# Patient Record
Sex: Female | Born: 1950 | Race: Black or African American | Hispanic: No | Smoking: Never smoker
Health system: Southern US, Community
[De-identification: ages and names within clinical notes are randomized; demographics above are authoritative.]

## PROBLEM LIST (undated history)

## (undated) DIAGNOSIS — G009 Bacterial meningitis, unspecified: Secondary | ICD-10-CM

## (undated) DIAGNOSIS — I10 Essential (primary) hypertension: Secondary | ICD-10-CM

## (undated) DIAGNOSIS — A419 Sepsis, unspecified organism: Secondary | ICD-10-CM

## (undated) DIAGNOSIS — J45909 Unspecified asthma, uncomplicated: Secondary | ICD-10-CM

## (undated) DIAGNOSIS — I5021 Acute systolic (congestive) heart failure: Secondary | ICD-10-CM

## (undated) HISTORY — PX: ABDOMINAL HYSTERECTOMY: SHX81

---

## 2012-07-26 ENCOUNTER — Emergency Department (HOSPITAL_COMMUNITY): Payer: Self-pay

## 2012-07-26 ENCOUNTER — Encounter (HOSPITAL_COMMUNITY): Payer: Self-pay | Admitting: Emergency Medicine

## 2012-07-26 ENCOUNTER — Observation Stay (HOSPITAL_COMMUNITY)
Admission: EM | Admit: 2012-07-26 | Discharge: 2012-07-27 | Disposition: A | Discharge status: Home / Self Care | Payer: MEDICAID | Attending: Internal Medicine | Admitting: Internal Medicine

## 2012-07-26 DIAGNOSIS — J45901 Unspecified asthma with (acute) exacerbation: Principal | ICD-10-CM | POA: Diagnosis present

## 2012-07-26 DIAGNOSIS — Z6841 Body Mass Index (BMI) 40.0 and over, adult: Secondary | ICD-10-CM | POA: Insufficient documentation

## 2012-07-26 DIAGNOSIS — Z79899 Other long term (current) drug therapy: Secondary | ICD-10-CM | POA: Insufficient documentation

## 2012-07-26 DIAGNOSIS — Z23 Encounter for immunization: Secondary | ICD-10-CM | POA: Insufficient documentation

## 2012-07-26 DIAGNOSIS — R609 Edema, unspecified: Secondary | ICD-10-CM | POA: Diagnosis present

## 2012-07-26 DIAGNOSIS — E669 Obesity, unspecified: Secondary | ICD-10-CM | POA: Diagnosis present

## 2012-07-26 DIAGNOSIS — I10 Essential (primary) hypertension: Secondary | ICD-10-CM | POA: Diagnosis present

## 2012-07-26 DIAGNOSIS — J4541 Moderate persistent asthma with (acute) exacerbation: Secondary | ICD-10-CM

## 2012-07-26 HISTORY — DX: Essential (primary) hypertension: I10

## 2012-07-26 HISTORY — DX: Unspecified asthma, uncomplicated: J45.909

## 2012-07-26 LAB — POCT I-STAT, CHEM 8
Calcium, Ion: 1.11 mmol/L — ABNORMAL LOW (ref 1.13–1.30)
Chloride: 105 mEq/L (ref 96–112)
Creatinine, Ser: 0.8 mg/dL (ref 0.50–1.10)
Glucose, Bld: 112 mg/dL — ABNORMAL HIGH (ref 70–99)
HCT: 40 % (ref 36.0–46.0)
Potassium: 3.9 mEq/L (ref 3.5–5.1)

## 2012-07-26 MED ORDER — IPRATROPIUM BROMIDE 0.02 % IN SOLN
0.5000 mg | RESPIRATORY_TRACT | Status: DC
  Administered 2012-07-27 (×2): 0.5 mg via RESPIRATORY_TRACT
  Filled 2012-07-26 (×2): qty 2.5

## 2012-07-26 MED ORDER — MOMETASONE FURO-FORMOTEROL FUM 100-5 MCG/ACT IN AERO
2.0000 | INHALATION_SPRAY | Freq: Two times a day (BID) | RESPIRATORY_TRACT | Status: DC
  Administered 2012-07-27: 2 via RESPIRATORY_TRACT
  Filled 2012-07-26: qty 8.8

## 2012-07-26 MED ORDER — ENOXAPARIN SODIUM 40 MG/0.4ML ~~LOC~~ SOLN
40.0000 mg | SUBCUTANEOUS | Status: DC
  Administered 2012-07-27: 40 mg via SUBCUTANEOUS
  Filled 2012-07-26: qty 0.4

## 2012-07-26 MED ORDER — ALBUTEROL (5 MG/ML) CONTINUOUS INHALATION SOLN
INHALATION_SOLUTION | RESPIRATORY_TRACT | Status: AC
  Filled 2012-07-26: qty 20

## 2012-07-26 MED ORDER — TRAZODONE HCL 50 MG PO TABS: 50.0000 mg | ORAL_TABLET | Freq: Every evening | ORAL | Status: DC | PRN

## 2012-07-26 MED ORDER — GUAIFENESIN ER 600 MG PO TB12
1200.0000 mg | ORAL_TABLET | Freq: Two times a day (BID) | ORAL | Status: DC
Start: 2012-07-27 — End: 2012-07-27
  Administered 2012-07-27 (×2): 1200 mg via ORAL
  Filled 2012-07-26 (×2): qty 2

## 2012-07-26 MED ORDER — ACETAMINOPHEN 650 MG RE SUPP: 650.0000 mg | Freq: Four times a day (QID) | RECTAL | Status: DC | PRN

## 2012-07-26 MED ORDER — ALBUTEROL SULFATE (5 MG/ML) 0.5% IN NEBU
2.5000 mg | INHALATION_SOLUTION | Freq: Once | RESPIRATORY_TRACT | Status: AC
  Administered 2012-07-26: 2.5 mg via RESPIRATORY_TRACT
  Filled 2012-07-26: qty 0.5

## 2012-07-26 MED ORDER — ALBUTEROL SULFATE (5 MG/ML) 0.5% IN NEBU
10.0000 mg | INHALATION_SOLUTION | Freq: Once | RESPIRATORY_TRACT | Status: AC
  Administered 2012-07-26: 10 mg via RESPIRATORY_TRACT

## 2012-07-26 MED ORDER — ALBUTEROL SULFATE (5 MG/ML) 0.5% IN NEBU: 2.5000 mg | INHALATION_SOLUTION | RESPIRATORY_TRACT | Status: DC | PRN

## 2012-07-26 MED ORDER — METHYLPREDNISOLONE SODIUM SUCC 125 MG IJ SOLR
125.0000 mg | Freq: Once | INTRAMUSCULAR | Status: AC
  Administered 2012-07-26: 125 mg via INTRAVENOUS
  Filled 2012-07-26: qty 2

## 2012-07-26 MED ORDER — ONDANSETRON HCL 4 MG PO TABS: 4.0000 mg | ORAL_TABLET | Freq: Four times a day (QID) | ORAL | Status: DC | PRN

## 2012-07-26 MED ORDER — METHYLPREDNISOLONE SODIUM SUCC 125 MG IJ SOLR
125.0000 mg | Freq: Four times a day (QID) | INTRAMUSCULAR | Status: DC
  Administered 2012-07-27 (×3): 125 mg via INTRAVENOUS
  Filled 2012-07-26 (×3): qty 2

## 2012-07-26 MED ORDER — LEVALBUTEROL HCL 0.63 MG/3ML IN NEBU
0.6300 mg | INHALATION_SOLUTION | Freq: Once | RESPIRATORY_TRACT | Status: AC
  Administered 2012-07-26: 0.63 mg via RESPIRATORY_TRACT
  Filled 2012-07-26: qty 3

## 2012-07-26 MED ORDER — ALBUTEROL SULFATE (5 MG/ML) 0.5% IN NEBU
2.5000 mg | INHALATION_SOLUTION | RESPIRATORY_TRACT | Status: DC
  Administered 2012-07-27 (×2): 2.5 mg via RESPIRATORY_TRACT
  Filled 2012-07-26 (×2): qty 0.5

## 2012-07-26 MED ORDER — IPRATROPIUM BROMIDE 0.02 % IN SOLN
0.5000 mg | Freq: Once | RESPIRATORY_TRACT | Status: AC
  Administered 2012-07-26: 0.5 mg via RESPIRATORY_TRACT
  Filled 2012-07-26: qty 2.5

## 2012-07-26 MED ORDER — ACETAMINOPHEN 325 MG PO TABS
650.0000 mg | ORAL_TABLET | Freq: Four times a day (QID) | ORAL | Status: DC | PRN
  Administered 2012-07-27 (×2): 650 mg via ORAL
  Filled 2012-07-26 (×2): qty 2

## 2012-07-26 MED ORDER — ONDANSETRON HCL 4 MG/2ML IJ SOLN: 4.0000 mg | Freq: Four times a day (QID) | INTRAMUSCULAR | Status: DC | PRN

## 2012-07-26 NOTE — ED Notes (Signed)
Pt c/o asthma attack x 2 days. No relief from inhaler or nebulizer.

## 2012-07-26 NOTE — ED Provider Notes (Signed)
History     CSN: [id number]  Arrival date & time 07/26/12  1545   First MD Initiated Contact with Patient 07/26/12 1558      Chief Complaint  Patient presents with  . Asthma     HPI Pt was seen at 1610.   Per pt, c/o gradual onset and worsening of persistent cough, wheezing and SOB for the past 2 days.  Describes her symptoms as "my asthma is acting up."  Has been using home MDI with transient relief. States she is here from out of town and "sleeping at the nursing home" because a family member is ill. Pt states she "might have come in contact with something I'm allergic to."  Denies hx intubations. LD prednisone approx 3 weeks ago for asthma exacerbation. Denies CP/palpitations, no back pain, no abd pain, no N/V/D, no fevers, no rash, no sore throat/dysphagia, no intra-oral edema, no hoarse voice, no stridor.     Past Medical History  Diagnosis Date  . Asthma   . Hypertension     History reviewed. No pertinent past surgical history.    History  Substance Use Topics  . Smoking status: Never Smoker   . Smokeless tobacco: Not on file  . Alcohol Use: No      Review of Systems ROS: Statement: All systems negative except as marked or noted in the HPI; Constitutional: Negative for fever and chills. ; ; Eyes: Negative for eye pain, redness and discharge. ; ; ENMT: Negative for ear pain, hoarseness, nasal congestion, sinus pressure and sore throat. ; ; Cardiovascular: Negative for chest pain, palpitations, diaphoresis, and peripheral edema. ; ; Respiratory: +cough, wheezing, SOB. Negative for stridor. ; ; Gastrointestinal: Negative for nausea, vomiting, diarrhea, abdominal pain, blood in stool, hematemesis, jaundice and rectal bleeding. . ; ; Genitourinary: Negative for dysuria, flank pain and hematuria. ; ; Musculoskeletal: Negative for back pain and neck pain. Negative for swelling and trauma.; ; Skin: Negative for pruritus, rash, abrasions, blisters, bruising and skin lesion.; ;  Neuro: Negative for headache, lightheadedness and neck stiffness. Negative for weakness, altered level of consciousness , altered mental status, extremity weakness, paresthesias, involuntary movement, seizure and syncope.       Allergies  Review of patient's allergies indicates no known allergies.  Home Medications   Current Outpatient Rx  Name  Route  Sig  Dispense  Refill  . albuterol (PROVENTIL HFA;VENTOLIN HFA) 108 (90 BASE) MCG/ACT inhaler   Inhalation   Inhale 2 puffs into the lungs every 6 (six) hours as needed for wheezing.         Marland Kitchen albuterol (PROVENTIL) (5 MG/ML) 0.5% nebulizer solution   Nebulization   Take 5 mg by nebulization every 6 (six) hours as needed for wheezing.           BP 141/83  Pulse 105  Temp(Src) 97.3 F (36.3 C) (Oral)  Resp 18  Ht 5\' 3"  (1.6 m)  Wt 280 lb (127.007 kg)  BMI 49.61 kg/m2  SpO2 91%  Physical Exam 1615: Physical examination:  Nursing notes reviewed; Vital signs and O2 SAT reviewed;  Constitutional: Well developed, Well nourished, Well hydrated, Uncomfortable appearing.; Head:  Normocephalic, atraumatic; Eyes: EOMI, PERRL, No scleral icterus; ENMT: Mouth and pharynx normal, Mucous membranes moist; Neck: Supple, Full range of motion, No lymphadenopathy; Cardiovascular: Tachycardic rate and rhythm, No gallop; Respiratory: Breath sounds diminished & equal bilaterally, insp/exp wheezes bilat with audible wheezing.  Speaking in phrases. Tachypneic, sitting upright on side of stretcher.; Chest: Nontender, Movement  normal; Abdomen: Soft, Nontender, Nondistended, Normal bowel sounds; Genitourinary: No CVA tenderness; Extremities: Pulses normal, No tenderness, No edema, No calf edema or asymmetry.; Neuro: AA&Ox3, Major CN grossly intact.  Speech clear. No gross focal motor or sensory deficits in extremities.; Skin: Color normal, Warm, Dry.   ED Course  Procedures   1620:  Uncomfortable, tachypneic, wheezing: ordered IV solumedrol and  continuous neb.  1830:  Pt has completed continuous neb. Still sitting up on side of stretcher, talking with family, lungs with exp wheezes bilat, occasional faint audible wheezing, Sats 91-93% R/A. Will dose xopenex neb due to tachycardia from hour long albuterol neb. Dx and testing d/w pt and family.  Questions answered.  Verb understanding, agreeable to observation admit.  T/C to [facility]. Kerry Hough, case discussed, including:  HPI, pertinent PM/SHx, VS/PE, dx testing, ED course and treatment:  Agreeable to observation admit, requests to write temporary orders, obtain medical bed to team 2.    MDM  MDM Reviewed: nursing note and vitals Interpretation: labs and x-ray Total time providing critical care: 30-74 minutes. This excludes time spent performing separately reportable procedures and services. Consults: admitting MD   CRITICAL CARE Performed by: Laray Anger Total critical care time: 40 Critical care time was exclusive of separately billable procedures and treating other patients. Critical care was necessary to treat or prevent imminent or life-threatening deterioration. Critical care was time spent personally by me on the following activities: development of treatment plan with patient and/or surrogate as well as nursing, discussions with consultants, evaluation of patient's response to treatment, examination of patient, obtaining history from patient or surrogate, ordering and performing treatments and interventions, ordering and review of laboratory studies, ordering and review of radiographic studies, pulse oximetry and re-evaluation of patient's condition.    Results for orders placed during the hospital encounter of 07/26/12  POCT I-STAT, CHEM 8      Result Value Range   Sodium 141  135 - 145 mEq/L   Potassium 3.9  3.5 - 5.1 mEq/L   Chloride 105  96 - 112 mEq/L   BUN 17  6 - 23 mg/dL   Creatinine, Ser 4.09  0.50 - 1.10 mg/dL   Glucose, Bld 811 (*) 70 - 99 mg/dL    Calcium, Ion 9.14 (*) 1.13 - 1.30 mmol/L   TCO2 30  0 - 100 mmol/L   Hemoglobin 13.6  12.0 - 15.0 g/dL   HCT 78.2  95.6 - 21.3 %   Dg Chest Port 1 View 07/26/2012   *RADIOLOGY REPORT*  Clinical Data: Shortness of breath.  Asthma.  PORTABLE CHEST - 1 VIEW  Comparison: None.  Findings: The heart size is exaggerated by low lung volumes.  No focal airspace disease is evident.  The lungs are clear.  The visualized soft tissues and bony thorax are unremarkable.  IMPRESSION:  1.  Low lung volumes. 2.  No acute cardiopulmonary disease.   Original Report Authenticated By: Marin Roberts, M.D.          Laray Anger, DO 07/29/12 [id number]

## 2012-07-26 NOTE — H&P (Signed)
[facility] History and Physical  Morgan Morgan  WUJ:[id number]  DOB: Mar 19, 1950   DOA: 07/26/2012   PCP:   No primary provider on file.   Chief Complaint:  Shortness of breath for 2 days  HPI: Morgan Morgan is a 62 y.o. female.   Obese middle-aged [place] American lady, lives in [place], has been visiting the area for 4 weeks because of illness of her mother. (31 years old with hip fracture,  In [facility]) she has not been using her antihypertensive medications for the past 4 weeks and developed shortness of breath for the past 2 days not relieved by using her nebulized. In the emergency room she had serial nebulizations with only mild improvement and the hospitalist service was called to admit. Patient also reports increasing swelling of her feet but believes this is due to being on her feet at [facility] for so long, and does not wish to have this attended to here. She just wants to get back home.  She reports a mild nonproductive cough she denies fever or chills She believes her antihypertensive medication is amlodipine 5 mg  Rewiew of Systems:   All systems negative except as marked bold or noted in the HPI;  Constitutional:    malaise, fever and chills. ;  Eyes:   eye pain, redness and discharge. ;  ENMT:   ear pain, hoarseness, nasal congestion, sinus pressure and sore throat. ;  Cardiovascular:    chest pain, palpitations, diaphoresis, dyspnea and peripheral edema.  Respiratory:   cough, hemoptysis, wheezing and stridor. ;  Gastrointestinal:  nausea, vomiting, diarrhea, constipation, abdominal pain, melena, blood in stool, hematemesis, jaundice and rectal bleeding. unusual weight loss..   Genitourinary:    frequency, dysuria, incontinence,flank pain and hematuria; Musculoskeletal:   back pain and neck pain.  swelling and trauma.;  Skin: .  pruritus, rash, abrasions, bruising and skin lesion.; ulcerations Neuro:    headache, lightheadedness and neck stiffness.  weakness,  altered level of consciousness, altered mental status, extremity weakness, burning feet, involuntary movement, seizure and syncope.  Psych:    anxiety, depression, insomnia, tearfulness, panic attacks, hallucinations, paranoia, suicidal or homicidal ideation    Past Medical History  Diagnosis Date  . Asthma   . Hypertension     History reviewed. No pertinent past surgical history.  Medications:  HOME MEDS: Prior to Admission medications   Medication Sig Start Date End Date Taking? Authorizing Provider  albuterol (PROVENTIL HFA;VENTOLIN HFA) 108 (90 BASE) MCG/ACT inhaler Inhale 2 puffs into the lungs every 6 (six) hours as needed for wheezing.   Yes Historical Provider, MD  albuterol (PROVENTIL) (5 MG/ML) 0.5% nebulizer solution Take 5 mg by nebulization every 6 (six) hours as needed for wheezing.   Yes Historical Provider, MD     Allergies:  No Known Allergies  Social History:   reports that she has never smoked. She does not have any smokeless tobacco history on file. She reports that she does not drink alcohol or use illicit drugs.  Family History: No family history on file.   Physical Exam: Filed Vitals:   07/26/12 1850 07/26/12 1900 07/26/12 1931 07/26/12 2108  BP: 141/83   144/81  Pulse: 105   101  Temp:    98.4 F (36.9 C)  TempSrc:    Oral  Resp: 18   20  Height:      Weight:      SpO2: 91% 92% 92% 93%   Blood pressure 144/81, pulse 101, temperature 98.4  F (36.9 C), temperature source Oral, resp. rate 20, height 5\' 3"  (1.6 m), weight 127.007 kg (280 lb), SpO2 93.00%.  GEN:  Pleasant obese African American lady sitting up  bed in no acute distress; cooperative with exam PSYCH:  alert and oriented x4; neither anxious nor depressed; affect is appropriate. HEENT: Mucous membranes pink and anicteric; PERRLA; EOM intact; no cervical lymphadenopathy nor thyromegaly or carotid bruit; no JVD; thick neck Breasts:: Not examined CHEST WALL: No tenderness CHEST:  Tachypnea and diffuse wheezing and rhonchi bilaterally HEART: Regular rate and rhythm; no murmurs rubs or gallops ABDOMEN: Obese, soft non-tender; no masses, no organomegaly, normal abdominal bowel sounds; Rectal Exam: Not done EXTREMITIES: age-appropriate arthropathy of the hands and knees; 2+ edema; no ulcerations. Genitalia: not examined PULSES: 2+ and symmetric SKIN: Normal hydration no rash or ulceration CNS: Cranial nerves 2-12 grossly intact no focal lateralizing neurologic deficit   Labs on Admission:  Basic Metabolic Panel:  Recent Labs Lab 07/26/12 1738  NA 141  K 3.9  CL 105  GLUCOSE 112*  BUN 17  CREATININE 0.80   Liver Function Tests: No results found for this basename: AST, ALT, ALKPHOS, BILITOT, PROT, ALBUMIN,  in the last 168 hours No results found for this basename: LIPASE, AMYLASE,  in the last 168 hours No results found for this basename: AMMONIA,  in the last 168 hours CBC:  Recent Labs Lab 07/26/12 1738  HGB 13.6  HCT 40.0   Cardiac Enzymes: No results found for this basename: CKTOTAL, CKMB, CKMBINDEX, TROPONINI,  in the last 168 hours BNP: No components found with this basename: POCBNP,  D-dimer: No components found with this basename: D-DIMER,  CBG: No results found for this basename: GLUCAP,  in the last 168 hours  Radiological Exams on Admission: Dg Chest Port 1 View  07/26/2012   *RADIOLOGY REPORT*  Clinical Data: Shortness of breath.  Asthma.  PORTABLE CHEST - 1 VIEW  Comparison: None.  Findings: The heart size is exaggerated by low lung volumes.  No focal airspace disease is evident.  The lungs are clear.  The visualized soft tissues and bony thorax are unremarkable.  IMPRESSION:  1.  Low lung volumes. 2.  No acute cardiopulmonary disease.   Original Report Authenticated By: Marin Roberts, M.D.    Assessment/Plan    Active Problems:   Asthma exacerbation   Obesity, unspecified   Edema   Essential hypertension,  benign   PLAN: We'll give this lady treatment for her eczema exacerbation, including nebulizations, intravenous steroids oxygen support,On inhaled steroids. Will not give antibiotics.  She is possibly having CHF exacerbation, and so will check a BNP although this is likely to be low in the setting of morbid obesity.  Discussed the importance of diet and exercise in the promotion of chronic good health; and she reports she is working on this with her doctor  Other plans as per orders.  Code Status: : Full code  Family Communication:  Plans discuss with patient and family at bedside Disposition Plan: Likely home in a day or 2    Morgan Morgan Nocturnist [facility] Pager [phone]   07/26/2012, 9:25 PM

## 2012-07-26 NOTE — ED Notes (Signed)
Attempted to call report

## 2012-07-27 LAB — CBC
MCH: 27.8 pg (ref 26.0–34.0)
MCHC: 32.2 g/dL (ref 30.0–36.0)
Platelets: 201 10*3/uL (ref 150–400)
Platelets: 209 10*3/uL (ref 150–400)
RBC: 4.31 MIL/uL (ref 3.87–5.11)
RDW: 14.4 % (ref 11.5–15.5)
RDW: 14.5 % (ref 11.5–15.5)
WBC: 6.8 10*3/uL (ref 4.0–10.5)

## 2012-07-27 LAB — URINE MICROSCOPIC-ADD ON

## 2012-07-27 LAB — HEMOGLOBIN A1C
Hgb A1c MFr Bld: 5.7 % — ABNORMAL HIGH (ref <5.7)
Mean Plasma Glucose: 117 mg/dL — ABNORMAL HIGH (ref <117)

## 2012-07-27 LAB — URINALYSIS, ROUTINE W REFLEX MICROSCOPIC
Bilirubin Urine: NEGATIVE
Ketones, ur: 15 mg/dL — AB
Nitrite: POSITIVE — AB
Specific Gravity, Urine: >1.03 — ABNORMAL HIGH (ref 1.005–1.030)
Urobilinogen, UA: 0.2 mg/dL (ref 0.0–1.0)

## 2012-07-27 LAB — CREATININE, SERUM
GFR calc Af Amer: >90 mL/min (ref >90)
GFR calc non Af Amer: >90 mL/min (ref >90)

## 2012-07-27 LAB — PRO B NATRIURETIC PEPTIDE: Pro B Natriuretic peptide (BNP): 252.3 pg/mL — ABNORMAL HIGH (ref 0–125)

## 2012-07-27 LAB — COMPREHENSIVE METABOLIC PANEL
AST: 18 U/L (ref 0–37)
Albumin: 3.2 g/dL — ABNORMAL LOW (ref 3.5–5.2)
Calcium: 9.1 mg/dL (ref 8.4–10.5)
Creatinine, Ser: 0.61 mg/dL (ref 0.50–1.10)
GFR calc non Af Amer: >90 mL/min (ref >90)

## 2012-07-27 MED ORDER — LEVOFLOXACIN 500 MG PO TABS: 500.0000 mg | ORAL_TABLET | Freq: Every day | ORAL | Status: AC

## 2012-07-27 MED ORDER — PREDNISONE 20 MG PO TABS: ORAL_TABLET | ORAL | Status: DC

## 2012-07-27 MED ORDER — MOMETASONE FURO-FORMOTEROL FUM 100-5 MCG/ACT IN AERO: 2.0000 | INHALATION_SPRAY | Freq: Two times a day (BID) | RESPIRATORY_TRACT | Status: AC

## 2012-07-27 MED ORDER — IBUPROFEN 400 MG PO TABS
400.0000 mg | ORAL_TABLET | Freq: Once | ORAL | Status: AC
  Administered 2012-07-27: 400 mg via ORAL
  Filled 2012-07-27: qty 1

## 2012-07-27 MED ORDER — PNEUMOCOCCAL VAC POLYVALENT 25 MCG/0.5ML IJ INJ
0.5000 mL | INJECTION | INTRAMUSCULAR | Status: AC
  Administered 2012-07-27: 0.5 mL via INTRAMUSCULAR
  Filled 2012-07-27: qty 0.5

## 2012-07-27 MED FILL — Albuterol Sulfate Soln Nebu 0.5% (5 MG/ML): RESPIRATORY_TRACT | Qty: 2 | Status: AC

## 2012-07-27 NOTE — Progress Notes (Signed)
Pt taken to main entrance in wheelchair by staff member.  

## 2012-07-27 NOTE — Discharge Summary (Signed)
Physician Discharge Summary  Morgan Morgan WUJ:[id number] DOB: [birth year] DOA: 07/26/2012  PCP: No primary provider on file.  Admit date: 07/26/2012 Discharge date: 07/27/2012  Time spent: Greater than 30 minutes  Recommendations for Outpatient Follow-up:  1. Followup with her primary care physician in [place].   Discharge Diagnoses:  1. Asthma excess sedation, improving. 2. Hypertension. 3. Obesity.   Discharge Condition: Improved. Stable.  Diet recommendation: Heart healthy diet.  Filed Weights   07/26/12 1547 07/27/12 0504  Weight: 127.007 kg (280 lb) 131.6 kg (290 lb 2 oz)    History of present illness:  This 62 year old lady, who is visiting this date from [place] to be with her mother who is in a skilled nursing facility, presented with symptoms of dyspnea. Please see initial history as outlined below: Morgan Morgan is a 62 y.o. female. Obese middle-aged [place] American lady, lives in [place], has been visiting the area for 4 weeks because of illness of her mother. (90 years old with hip fracture, In [facility]) she has not been using her antihypertensive medications for the past 4 weeks and developed shortness of breath for the past 2 days not relieved by using her nebulized. In the emergency room she had serial nebulizations with only mild improvement and the hospitalist service was called to admit.  Patient also reports increasing swelling of her feet but believes this is due to being on her feet at [facility] for so long, and does not wish to have this attended to here. She just wants to get back home.  She reports a mild nonproductive cough she denies fever or chills  She believes her antihypertensive medication is amlodipine 5 mg  Hospital Course:  The patient has done well overnight with intravenous steroids and bronchodilators. She feels 60-70% improved and wishes to go home. She does not have increased work of breathing. She has a productive cough of brown sputum.  There is no evidence of pneumonia on chest x-ray.  Procedures:  None.   Consultations:  None.  Discharge Exam: Filed Vitals:   07/27/12 0504 07/27/12 0735 07/27/12 0831 07/27/12 0835  BP: 135/81     Pulse: 92   90  Temp: 97.9 F (36.6 C)     TempSrc: Oral     Resp: 20   20  Height:      Weight: 131.6 kg (290 lb 2 oz)     SpO2: 93% 94% 91% 93%    General: She looks systemically well. There is no increased work of breathing. There is no peripheral or central cyanosis. Cardiovascular: Heart sounds are present without murmurs or added sounds. There is no clinical evidence of heart failure. Respiratory: Lung fields show bilateral wheezing, not tight. There is no bronchial breathing or crackles. She is alert and orientated.  Discharge Instructions  Discharge Orders   Future Orders Complete By Expires     Diet - low sodium heart healthy  As directed     Increase activity slowly  As directed         Medication List    TAKE these medications       albuterol (5 MG/ML) 0.5% nebulizer solution  Commonly known as:  PROVENTIL  Take 5 mg by nebulization every 6 (six) hours as needed for wheezing.     albuterol 108 (90 BASE) MCG/ACT inhaler  Commonly known as:  PROVENTIL HFA;VENTOLIN HFA  Inhale 2 puffs into the lungs every 6 (six) hours as needed for wheezing.     amLODipine 5  MG tablet  Commonly known as:  NORVASC  Take 5 mg by mouth daily.     levofloxacin 500 MG tablet  Commonly known as:  LEVAQUIN  Take 1 tablet (500 mg total) by mouth daily.     mometasone-formoterol 100-5 MCG/ACT Aero  Commonly known as:  DULERA  Inhale 2 puffs into the lungs 2 (two) times daily.     predniSONE 20 MG tablet  Commonly known as:  DELTASONE  Take 2 tablets daily for 3 days, then 1 tablet daily for 3 days, then half tablet daily for 3 days, then STOP.       Allergies  Allergen Reactions  . Zithromax (Azithromycin) Itching      The results of significant diagnostics from  this hospitalization (including imaging, microbiology, ancillary and laboratory) are listed below for reference.    Significant Diagnostic Studies: Dg Chest Port 1 View  07/26/2012   *RADIOLOGY REPORT*  Clinical Data: Shortness of breath.  Asthma.  PORTABLE CHEST - 1 VIEW  Comparison: None.  Findings: The heart size is exaggerated by low lung volumes.  No focal airspace disease is evident.  The lungs are clear.  The visualized soft tissues and bony thorax are unremarkable.  IMPRESSION:  1.  Low lung volumes. 2.  No acute cardiopulmonary disease.   Original Report Authenticated By: Marin Roberts, M.D.        Labs: Basic Metabolic Panel:  Recent Labs Lab 07/26/12 1738 07/27/12 0013 07/27/12 0514  NA 141  --  140  K 3.9  --  3.7  CL 105  --  104  CO2  --   --  28  GLUCOSE 112*  --  135*  BUN 17  --  15  CREATININE 0.80 0.64 0.61  CALCIUM  --   --  9.1   Liver Function Tests:  Recent Labs Lab 07/27/12 0514  AST 18  ALT 12  ALKPHOS 91  BILITOT 0.3  PROT 7.2  ALBUMIN 3.2*     CBC:  Recent Labs Lab 07/26/12 1738 07/27/12 0013 07/27/12 0514  WBC  --  6.8 5.6  HGB 13.6 11.8* 12.0  HCT 40.0 36.3 37.3  MCV  --  86.4 86.5  PLT  --  209 201     BNP: BNP (last 3 results)  Recent Labs  07/27/12 0013  PROBNP 252.3*         Signed:  GOSRANI,NIMISH C  [facility] 07/27/2012, 10:14 AM

## 2012-07-27 NOTE — Progress Notes (Addendum)
IV removed, site WNL.  Pt given d/c instructions and new prescriptions.  Discussed home care with patient and discussed home medications, patient verbalizes understanding, teachback completed. F/U appointment [place] be [place] pt states they will make and keep appointment. Pt is stable at this time and greatly desires to be discharged.

## 2015-02-05 IMAGING — CR DG CHEST 1V PORT
1 series · 1 of 1 positions shown · non-contrast
Comparison: None.

CLINICAL DATA: Shortness of breath.  Asthma.

PORTABLE CHEST - 1 VIEW

[view not recorded]
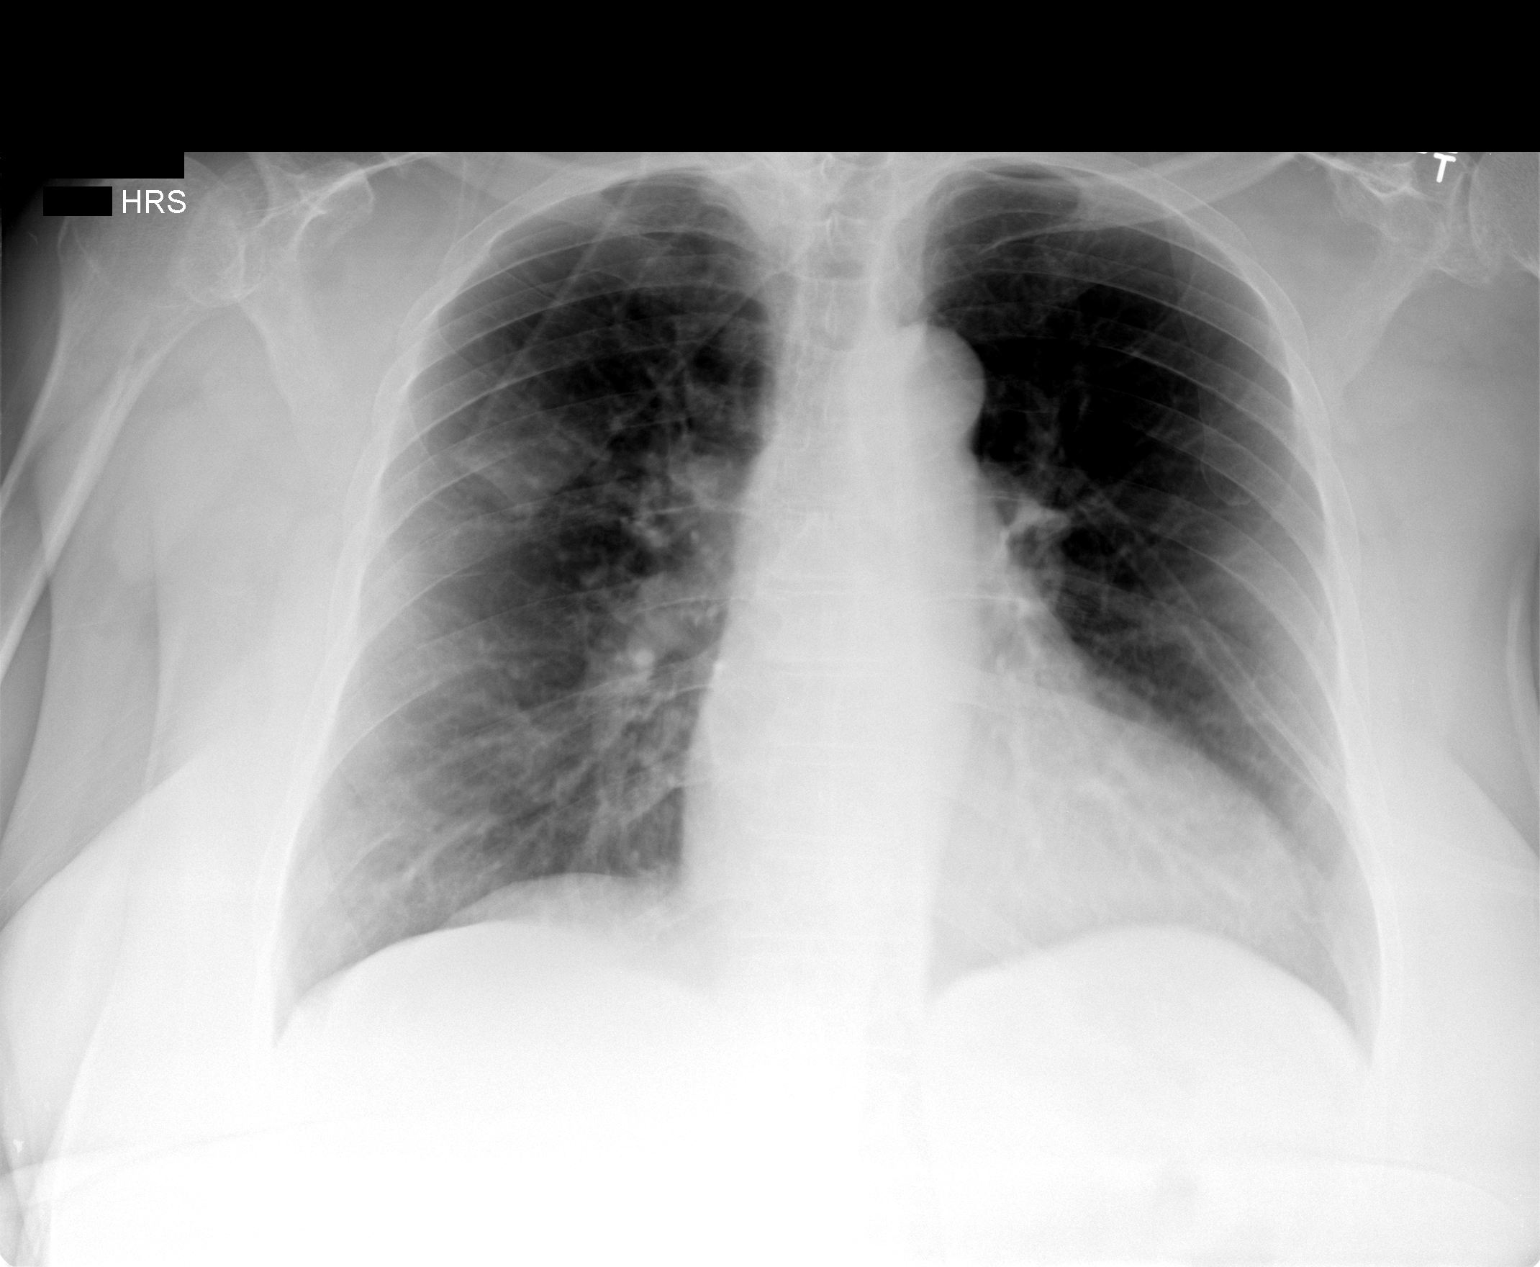

[1 of 1 positions shown; findings below may reference images not displayed]

FINDINGS: The heart size is exaggerated by low lung volumes.  No
focal airspace disease is evident.  The lungs are clear.  The
visualized soft tissues and bony thorax are unremarkable.
IMPRESSION: 1.  Low lung volumes.
2.  No acute cardiopulmonary disease.

## 2021-10-05 ENCOUNTER — Emergency Department (HOSPITAL_COMMUNITY)
Admission: EM | Admit: 2021-10-05 | Discharge: 2021-10-05 | Disposition: A | Discharge status: Home / Self Care | Payer: Medicare Other | Attending: Emergency Medicine | Admitting: Emergency Medicine

## 2021-10-05 ENCOUNTER — Telehealth: Payer: Self-pay | Admitting: Gastroenterology

## 2021-10-05 ENCOUNTER — Emergency Department (HOSPITAL_COMMUNITY): Payer: Medicare Other

## 2021-10-05 ENCOUNTER — Encounter (HOSPITAL_COMMUNITY): Payer: Self-pay

## 2021-10-05 DIAGNOSIS — R112 Nausea with vomiting, unspecified: Secondary | ICD-10-CM

## 2021-10-05 DIAGNOSIS — Z79899 Other long term (current) drug therapy: Secondary | ICD-10-CM | POA: Diagnosis not present

## 2021-10-05 DIAGNOSIS — J45909 Unspecified asthma, uncomplicated: Secondary | ICD-10-CM | POA: Insufficient documentation

## 2021-10-05 DIAGNOSIS — I10 Essential (primary) hypertension: Secondary | ICD-10-CM | POA: Diagnosis not present

## 2021-10-05 DIAGNOSIS — Z7951 Long term (current) use of inhaled steroids: Secondary | ICD-10-CM | POA: Insufficient documentation

## 2021-10-05 DIAGNOSIS — Q446 Cystic disease of liver: Secondary | ICD-10-CM | POA: Diagnosis not present

## 2021-10-05 DIAGNOSIS — N281 Cyst of kidney, acquired: Secondary | ICD-10-CM | POA: Insufficient documentation

## 2021-10-05 LAB — CBC
HCT: 47.8 % — ABNORMAL HIGH (ref 36.0–46.0)
Hemoglobin: 15.5 g/dL — ABNORMAL HIGH (ref 12.0–15.0)
MCH: 27.8 pg (ref 26.0–34.0)
MCHC: 32.4 g/dL (ref 30.0–36.0)
MCV: 85.7 fL (ref 80.0–100.0)
Platelets: 242 10*3/uL (ref 150–400)
RBC: 5.58 MIL/uL — ABNORMAL HIGH (ref 3.87–5.11)
RDW: 16.1 % — ABNORMAL HIGH (ref 11.5–15.5)
WBC: 6.5 10*3/uL (ref 4.0–10.5)
nRBC: 0 % (ref 0.0–0.2)

## 2021-10-05 LAB — URINALYSIS, ROUTINE W REFLEX MICROSCOPIC
Bacteria, UA: NONE SEEN
Bilirubin Urine: NEGATIVE
Glucose, UA: NEGATIVE mg/dL
Ketones, ur: 80 mg/dL — AB
Leukocytes,Ua: NEGATIVE
Nitrite: NEGATIVE
Protein, ur: NEGATIVE mg/dL
Specific Gravity, Urine: >1.046 — ABNORMAL HIGH (ref 1.005–1.030)
pH: 5 (ref 5.0–8.0)

## 2021-10-05 LAB — COMPREHENSIVE METABOLIC PANEL
ALT: 11 U/L (ref 0–44)
AST: 18 U/L (ref 15–41)
Albumin: 3.7 g/dL (ref 3.5–5.0)
Alkaline Phosphatase: 85 U/L (ref 38–126)
Anion gap: 8 (ref 5–15)
BUN: 18 mg/dL (ref 8–23)
CO2: 30 mmol/L (ref 22–32)
Calcium: 9.9 mg/dL (ref 8.9–10.3)
Chloride: 100 mmol/L (ref 98–111)
Creatinine, Ser: 0.84 mg/dL (ref 0.44–1.00)
GFR, Estimated: >60 mL/min (ref >60)
Glucose, Bld: 111 mg/dL — ABNORMAL HIGH (ref 70–99)
Potassium: 4 mmol/L (ref 3.5–5.1)
Sodium: 138 mmol/L (ref 135–145)
Total Bilirubin: 0.6 mg/dL (ref 0.3–1.2)
Total Protein: 8.4 g/dL — ABNORMAL HIGH (ref 6.5–8.1)

## 2021-10-05 LAB — MAGNESIUM: Magnesium: 2 mg/dL (ref 1.7–2.4)

## 2021-10-05 LAB — LIPASE, BLOOD: Lipase: 30 U/L (ref 11–51)

## 2021-10-05 MED ORDER — SODIUM CHLORIDE 0.9 % IV BOLUS
1000.0000 mL | Freq: Once | INTRAVENOUS | Status: AC
Start: 2021-10-05 — End: 2021-10-05
  Administered 2021-10-05: 1000 mL via INTRAVENOUS

## 2021-10-05 MED ORDER — IOHEXOL 300 MG/ML  SOLN
80.0000 mL | Freq: Once | INTRAMUSCULAR | Status: AC | PRN
  Administered 2021-10-05: 100 mL via INTRAVENOUS

## 2021-10-05 MED ORDER — ONDANSETRON HCL 4 MG PO TABS: 4.0000 mg | ORAL_TABLET | Freq: Four times a day (QID) | ORAL | 0 refills | Status: DC

## 2021-10-05 MED ORDER — ONDANSETRON HCL 4 MG/2ML IJ SOLN
4.0000 mg | Freq: Once | INTRAMUSCULAR | Status: AC
Start: 2021-10-05 — End: 2021-10-05
  Administered 2021-10-05: 4 mg via INTRAVENOUS
  Filled 2021-10-05: qty 2

## 2021-10-05 MED ORDER — SODIUM CHLORIDE 0.9 % IV SOLN: INTRAVENOUS | Status: DC

## 2021-10-05 NOTE — ED Notes (Signed)
2 unsuccessful IV attempts at this time. Consulting civil engineer notified.

## 2021-10-05 NOTE — ED Triage Notes (Signed)
Pt states she has had emesis since Thursday. Denies blood. Pt states last BM Friday, small but normal.   Pt states constant nausea.

## 2021-10-05 NOTE — ED Notes (Signed)
Patient transported to CT 

## 2021-10-05 NOTE — ED Notes (Signed)
Patient able to tolerate 8 oz of water without nausea or vomiting.

## 2021-10-05 NOTE — Discharge Instructions (Addendum)
Today you were seen in the emergency department for your nausea and vomiting.    In the emergency department you had a CT scan that showed cysts in your liver and kidneys likely due to polycystic liver disease.    At home, please take the Zofran as needed for any nausea and vomiting that you may have.    Follow-up with your primary doctor in 2-3 days regarding your visit.  Please also follow-up with the GI doctors regarding your liver cysts - they will call you to set up an appointment.   Return immediately to the emergency department if you experience any of the following: Recurrent nausea or vomiting, fevers, severe abdominal pain, or any other concerning symptoms.    Thank you for visiting our Emergency Department. It was a pleasure taking care of you today.

## 2021-10-05 NOTE — Telephone Encounter (Signed)
Darl Pikes:  Patient was seen in the ED. Incidentally found to have polycystic liver disease.  Can you arrange an outpatient follow-up with me? Thanks!

## 2021-10-05 NOTE — ED Provider Notes (Signed)
[facility] EMERGENCY DEPARTMENT Provider Note   CSN: [id number] Arrival date & time: 10/05/21  1109     History Chief Complaint  Patient presents with   Emesis    Morgan Morgan is a 71 y.o. female.  71 year old female with a history of remote hysterectomy, hypertension, asthma who presents emergency department with nausea and vomiting.  Patient reports that on Thursday night she started experiencing nausea and vomiting.  Says that it is yellow and has not been bloody or bilious.  Says that she is having approximately 6 episodes per day.  Says that her last bowel movement was on Friday and has not had one since.  Says this is atypical because she usually has 2 bowel movements per day.  Did pass gas yesterday but does not believe she is passing gas today.  Denies any abdominal surgery history aside from her hysterectomy.  No history of bowel obstructions.  Denies any marijuana use.  Denies any fevers, sick contacts, recent travel, chest pain, shortness of breath, dizziness, or fatigue otherwise.  No abdominal pain associated with nausea and vomiting.  Denies any dysuria or frequency.   Emesis      Home Medications Prior to Admission medications   Medication Sig Start Date End Date Taking? Authorizing Provider  amLODipine (NORVASC) 5 MG tablet Take 5 mg by mouth daily.   Yes [provider]  D3 SUPER STRENGTH 50 MCG (2000 UT) CAPS Take 1 capsule by mouth daily. 08/03/21  Yes [provider]  ibuprofen (ADVIL) 200 MG tablet Take 200 mg by mouth every 6 (six) hours as needed for mild pain, headache or fever.   Yes [provider]  mometasone-formoterol (DULERA) 100-5 MCG/ACT AERO Inhale 2 puffs into the lungs 2 (two) times daily. 07/27/12  Yes Gosrani, Nimish C, MD  ondansetron (ZOFRAN) 4 MG tablet Take 1 tablet (4 mg total) by mouth every 6 (six) hours. 10/05/21  Yes Rondel Baton, MD      Allergies    Zithromax [azithromycin]    Review of Systems    Review of Systems  Gastrointestinal:  Positive for vomiting.    Physical Exam Updated Vital Signs BP (!) 158/91   Pulse 68   Temp 97.9 F (36.6 C) (Oral)   Resp 18   Ht 5\' 3"  (1.6 m)   Wt 103 kg   SpO2 97%   BMI 40.22 kg/m  Physical Exam Vitals and nursing note reviewed.  Constitutional:      General: She is not in acute distress.    Appearance: She is well-developed.     Comments: Uncomfortable appearing holding emesis basin  HENT:     Head: Normocephalic and atraumatic.     Right Ear: External ear normal.     Left Ear: External ear normal.     Nose: Nose normal.  Eyes:     Extraocular Movements: Extraocular movements intact.     Conjunctiva/sclera: Conjunctivae normal.     Pupils: Pupils are equal, round, and reactive to light.  Cardiovascular:     Rate and Rhythm: Normal rate and regular rhythm.     Heart sounds: No murmur heard. Pulmonary:     Effort: Pulmonary effort is normal. No respiratory distress.     Breath sounds: Normal breath sounds.  Abdominal:     Palpations: Abdomen is soft.     Tenderness: There is no right CVA tenderness or left CVA tenderness.     Comments: Initial exam limited due to the fact that  patient was seated in a triage chair.  Prior surgery from what appears to be an open abdominal hysterectomy.  No significant tenderness or guarding.  Musculoskeletal:        General: No swelling.     Cervical back: Normal range of motion and neck supple.  Skin:    General: Skin is warm and dry.     Capillary Refill: Capillary refill takes 2 to 3 seconds.  Neurological:     Mental Status: She is alert and oriented to person, place, and time. Mental status is at baseline.  Psychiatric:        Mood and Affect: Mood normal.     ED Results / Procedures / Treatments   Labs (all labs ordered are listed, but only abnormal results are displayed) Labs Reviewed  COMPREHENSIVE METABOLIC PANEL - Abnormal; Notable for the following components:      Result  Value   Glucose, Bld 111 (*)    Total Protein 8.4 (*)    All other components within normal limits  CBC - Abnormal; Notable for the following components:   RBC 5.58 (*)    Hemoglobin 15.5 (*)    HCT 47.8 (*)    RDW 16.1 (*)    All other components within normal limits  URINALYSIS, ROUTINE W REFLEX MICROSCOPIC - Abnormal; Notable for the following components:   Specific Gravity, Urine >1.046 (*)    Hgb urine dipstick MODERATE (*)    Ketones, ur 80 (*)    All other components within normal limits  LIPASE, BLOOD  MAGNESIUM    EKG None  Radiology CT ABDOMEN PELVIS W CONTRAST  Result Date: 10/05/2021 CLINICAL DATA:  Bowel obstruction suspected. Nausea beginning several days ago. EXAM: CT ABDOMEN AND PELVIS WITH CONTRAST TECHNIQUE: Multidetector CT imaging of the abdomen and pelvis was performed using the standard protocol following bolus administration of intravenous contrast. RADIATION DOSE REDUCTION: This exam was performed according to the departmental dose-optimization program which includes automated exposure control, adjustment of the mA and/or kV according to patient size and/or use of iterative reconstruction technique. CONTRAST:  OMNIPAQUE IOHEXOL 300 MG/ML  SOLN COMPARISON:  None FINDINGS: Lower chest: Cardiomegaly. Lung bases are clear. No pleural or pericardial effusion. Hepatobiliary: Innumerable cysts throughout the liver. The largest is at the caudal tip of the right lobe measuring 7.5 cm in diameter. No calcified gallstones. Pancreas: Normal Spleen: Normal Adrenals/Urinary Tract: Adrenal glands are normal. Multiple simple cysts the kidneys. No mass, stone or hydronephrosis. No specific follow-up recommended. Stomach/Bowel: Stomach and small intestine are normal. Diverticulosis of the left colon without evidence of diverticulitis. Vascular/Lymphatic: Aortic atherosclerosis. No aneurysm. IVC is normal. No adenopathy. Reproductive: Previous hysterectomy.  No pelvic mass. Other:  No free fluid or air. Musculoskeletal: Ordinary lumbar degenerative changes. IMPRESSION: No acute finding to explain the clinical presentation. No evidence of bowel obstruction or acute inflammation. Diverticulosis of the colon without evidence of diverticulitis. Numerous benign cysts of the liver and kidneys, more extensively affecting the liver than the kidneys. No specific follow-up recommended. Aortic Atherosclerosis (ICD10-I70.0). Ordinary spinal degenerative changes. Electronically Signed   By: Paulina Fusi M.D.   On: 10/05/2021 14:06    Procedures Procedures   Medications Ordered in ED Medications  sodium chloride 0.9 % bolus 1,000 mL (0 mLs Intravenous Stopped 10/05/21 1633)    And  0.9 %  sodium chloride infusion (has no administration in time range)  ondansetron (ZOFRAN) injection 4 mg (4 mg Intravenous Given 10/05/21 1251)  iohexol (OMNIPAQUE)  300 MG/ML solution 80 mL (100 mLs Intravenous Contrast Given 10/05/21 1320)    ED Course/ Medical Decision Making/ A&P Clinical Course as of 10/05/21 2018  Mon Oct 05, 2021  1315 Creatinine: 0.84 Baseline of 0.6 [RP]  1328 CT scan reviewed and interpreted by me as showing numerous masses in the liver as well as kidney.  Concerning for malignancy.  [RP]  [id number] CT ABDOMEN PELVIS W CONTRAST IMPRESSION: No acute finding to explain the clinical presentation. No evidence of bowel obstruction or acute inflammation. Diverticulosis of the colon without evidence of diverticulitis.  Numerous benign cysts of the liver and kidneys, more extensively affecting the liver than the kidneys. No specific follow-up recommended.  Aortic Atherosclerosis (ICD10-I70.0).  Ordinary spinal degenerative changes. [RP]  [id number] Spoke to radiology regarding the patient's imaging.  They feel that this could be consistent with polycystic kidney or liver disease. [RP]  [id number] Spoke with PA Cyndi Bender from GI regarding the patient's CT findings.  They will follow-up with the  patient in clinic. [RP]    Clinical Course User Index [RP] Fransico Meadow, MD                           Medical Decision Making Amount and/or Complexity of Data Reviewed Labs: ordered. Decision-making details documented in ED Course. Radiology: ordered. Decision-making details documented in ED Course.  Risk Prescription drug management.   71 year old female with a history of remote hysterectomy, hypertension, asthma who presents emergency department with nausea and vomiting.  Initial DDx: bowel obstruction, gastritis, N/V, ileus   Plan:  Labs Zofran Fluids CT abdomen/pelvis  ED Summary:  Patient underwent the above work-up which did show evidence of bowel obstruction.  Did however show multiple cystic lesions that were concerning for possible polycystic liver disease and kidney disease.  Patient was informed of these findings.  Her nausea and vomiting was controlled with Zofran.  She did not have any severe electrolyte abnormalities and was feeling much better after the Zofran and fluids.  Discussed the patient's case with GI who would like to see her as an outpatient.  Patient was also informed that she should follow-up with her primary doctor in several days.  Return precautions discussed prior to discharge and she was given a prescription of Zofran.  Consults: - GI  Records reviewed Care Everywhere/External Records  I independently visualized the following imaging with scope of interpretation limited to determining acute life threatening conditions related to emergency care: CT, which revealed multiple cystic lesions  Final Clinical Impression(s) / ED Diagnoses Final diagnoses:  Polycystic liver disease  Bilateral renal cysts  Nausea and vomiting, unspecified vomiting type    Rx / DC Orders ED Discharge Orders          Ordered    ondansetron (ZOFRAN) 4 MG tablet  Every 6 hours        10/05/21 1548    Ambulatory referral to Gastroenterology       Comments:  Polycystic liver disease   10/05/21 1549              Fransico Meadow, MD 10/05/21 2018

## 2021-10-22 ENCOUNTER — Encounter: Payer: Self-pay | Admitting: *Deleted

## 2021-10-26 NOTE — Telephone Encounter (Signed)
OV made °

## 2021-11-09 ENCOUNTER — Ambulatory Visit: Payer: Medicare Other | Admitting: Gastroenterology

## 2021-11-19 ENCOUNTER — Ambulatory Visit (INDEPENDENT_AMBULATORY_CARE_PROVIDER_SITE_OTHER): Payer: Medicare Other | Admitting: Gastroenterology

## 2021-11-19 ENCOUNTER — Encounter: Payer: Self-pay | Admitting: Gastroenterology

## 2021-11-19 DIAGNOSIS — Q446 Cystic disease of liver: Secondary | ICD-10-CM | POA: Diagnosis not present

## 2021-11-19 NOTE — Progress Notes (Signed)
Gastroenterology Office Note    Referring Provider: [facility]* Primary Care Physician:  Karl Bales, NP  Primary GI: Dr. Marletta Lor    Chief Complaint   Chief Complaint  Patient presents with   New Patient (Initial Visit)    Pt was bothered by nausea. Pt states that was weeks ago. She is fine now     History of Present Illness   Morgan Morgan is a 71 y.o. female presenting today at the request of Yetta Barre, Kristi S, NP due to history of nausea.   Patient was seen in the ED in late August due to episode of nausea that lasted a week. She had associated vomiting. Regurgitation. This has all resolved now. No chronic GERD.  No dysphagia. No prior colonoscopy. Not interested in colonoscopy. No rectal bleeding. No abdominal pain, changes in bowel habits, constipation, diarrhea, overt GI bleeding,  unexplained weight loss, lack of appetite, unexplained weight gain.   Incidentally found to have innumerable cysts in liver and multiple cysts in kidneys. She states her family member had this as well. She is asymptomatic.     Past Medical History:  Diagnosis Date   Asthma    Hypertension     Past Surgical History:  Procedure Laterality Date   ABDOMINAL HYSTERECTOMY      Current Outpatient Medications  Medication Sig Dispense Refill   albuterol (VENTOLIN HFA) 108 (90 Base) MCG/ACT inhaler Inhale into the lungs every 6 (six) hours as needed for wheezing or shortness of breath.     amLODipine (NORVASC) 5 MG tablet Take 5 mg by mouth daily.     D3 SUPER STRENGTH 50 MCG (2000 UT) CAPS Take 1 capsule by mouth daily.     mometasone-formoterol (DULERA) 100-5 MCG/ACT AERO Inhale 2 puffs into the lungs 2 (two) times daily. 1 Inhaler 0   No current facility-administered medications for this visit.    Allergies as of 11/19/2021 - Review Complete 11/19/2021  Allergen Reaction Noted   Zithromax [azithromycin] Itching 07/26/2012    Family History  Problem Relation Age of  Onset   Asthma Mother    Asthma Brother    Asthma Son    Colon cancer Neg Hx    Colon polyps Neg Hx     Social History   Socioeconomic History   Marital status: Widowed    Spouse name: Not on file   Number of children: Not on file   Years of education: Not on file   Highest education level: Not on file  Occupational History   Not on file  Tobacco Use   Smoking status: Never   Smokeless tobacco: Not on file  Substance and Sexual Activity   Alcohol use: No   Drug use: No   Sexual activity: Yes  Other Topics Concern   Not on file  Social History Narrative   Not on file   Social Determinants of Health   Financial Resource Strain: Not on file  Food Insecurity: Not on file  Transportation Needs: Not on file  Physical Activity: Not on file  Stress: Not on file  Social Connections: Not on file  Intimate Partner Violence: Not on file     Review of Systems   Gen: Denies any fever, chills, fatigue, weight loss, lack of appetite.  CV: Denies chest pain, heart palpitations, peripheral edema, syncope.  Resp: Denies shortness of breath at rest or with exertion. Denies wheezing or cough.  GI: Denies dysphagia or odynophagia. Denies jaundice, hematemesis, fecal  incontinence. GU : Denies urinary burning, urinary frequency, urinary hesitancy MS: Denies joint pain, muscle weakness, cramps, or limitation of movement.  Derm: Denies rash, itching, dry skin Psych: Denies depression, anxiety, memory loss, and confusion Heme: Denies bruising, bleeding, and enlarged lymph nodes.   Physical Exam   BP (!) 176/98 Comment: pt hasn't taken medication today  Pulse 76   Temp (!) 97.5 F (36.4 C)   Ht 5\' 2"  (1.575 m)   Wt 240 lb (108.9 kg)   BMI 43.90 kg/m  General:   Alert and oriented. Pleasant and cooperative. Well-nourished and well-developed.  Head:  Normocephalic and atraumatic. Eyes:  Without icterus Ears:  Normal auditory acuity. Lungs:  Clear to auscultation bilaterally.   Heart:  S1, S2 present without murmurs appreciated.  Abdomen:  +BS, soft, non-tender and non-distended. No HSM noted. No guarding or rebound. No masses appreciated.  Rectal:  Deferred  Msk:  Symmetrical without gross deformities. Normal posture. Extremities:  Without edema. Neurologic:  Alert and  oriented x4;  grossly normal neurologically. Skin:  Intact without significant lesions or rashes. Psych:  Alert and cooperative. Normal mood and affect.   Assessment   Morgan Morgan is a 71 y.o. female presenting today at the request of PCP due to prior episode of nausea.  71 y.o. female presenting today at the request of Ronnald Ramp, Kristi S, NP due to history of nausea.   Seen in the ED late August with week long duration of N/V, which has now completely resolved. She has no current or chronic GI concerns.   Incidentally also found to have polycystic liver disease. She is completely asymptomatic at this time. No further imaging needed.   Notably, she has never had initial screening colonoscopy. No FH colon cancer or polyps. We discussed screening, but she declined today.     PLAN   Return prn  Call if any further episodes of N/V  Colonoscopy when willing   Annitta Needs, PhD, ANP-BC [facility]

## 2021-11-19 NOTE — Patient Instructions (Signed)
Please call us if any further nausea, vomiting, abdominal pain. We recommend a routine screening colonoscopy if you are willing in the future. Please call us if you would like to pursue this!  We will see you back as needed!  It was a pleasure to see you today. I want to create trusting relationships with patients to provide genuine, compassionate, and quality care. I value your feedback. If you receive a survey regarding your visit,  I greatly appreciate you taking time to fill this out.   Annitta Needs, PhD, ANP-BC [facility]

## 2021-11-24 ENCOUNTER — Ambulatory Visit: Payer: Medicare Other | Admitting: Gastroenterology

## 2022-07-22 ENCOUNTER — Other Ambulatory Visit (HOSPITAL_COMMUNITY): Payer: Self-pay | Admitting: Family Medicine

## 2022-07-22 DIAGNOSIS — Z1231 Encounter for screening mammogram for malignant neoplasm of breast: Secondary | ICD-10-CM

## 2022-07-29 ENCOUNTER — Ambulatory Visit (HOSPITAL_COMMUNITY): Payer: Medicare Other

## 2023-05-25 ENCOUNTER — Inpatient Hospital Stay (HOSPITAL_COMMUNITY)
Admission: EM | Admit: 2023-05-25 | Discharge: 2023-06-10 | DRG: 870 | Disposition: A | Discharge status: Skilled Nursing Facility | Attending: Internal Medicine | Admitting: Internal Medicine

## 2023-05-25 ENCOUNTER — Emergency Department (HOSPITAL_COMMUNITY)

## 2023-05-25 ENCOUNTER — Encounter (HOSPITAL_COMMUNITY): Payer: Self-pay | Admitting: Internal Medicine

## 2023-05-25 ENCOUNTER — Other Ambulatory Visit: Payer: Self-pay

## 2023-05-25 DIAGNOSIS — E876 Hypokalemia: Secondary | ICD-10-CM | POA: Diagnosis present

## 2023-05-25 DIAGNOSIS — I5021 Acute systolic (congestive) heart failure: Secondary | ICD-10-CM

## 2023-05-25 DIAGNOSIS — Z6837 Body mass index (BMI) 37.0-37.9, adult: Secondary | ICD-10-CM | POA: Diagnosis not present

## 2023-05-25 DIAGNOSIS — G009 Bacterial meningitis, unspecified: Secondary | ICD-10-CM | POA: Diagnosis present

## 2023-05-25 DIAGNOSIS — N170 Acute kidney failure with tubular necrosis: Secondary | ICD-10-CM | POA: Diagnosis not present

## 2023-05-25 DIAGNOSIS — M4640 Discitis, unspecified, site unspecified: Secondary | ICD-10-CM

## 2023-05-25 DIAGNOSIS — R6521 Severe sepsis with septic shock: Secondary | ICD-10-CM | POA: Diagnosis not present

## 2023-05-25 DIAGNOSIS — J069 Acute upper respiratory infection, unspecified: Secondary | ICD-10-CM | POA: Diagnosis present

## 2023-05-25 DIAGNOSIS — I110 Hypertensive heart disease with heart failure: Secondary | ICD-10-CM | POA: Diagnosis not present

## 2023-05-25 DIAGNOSIS — N179 Acute kidney failure, unspecified: Secondary | ICD-10-CM | POA: Diagnosis not present

## 2023-05-25 DIAGNOSIS — G9340 Encephalopathy, unspecified: Secondary | ICD-10-CM | POA: Diagnosis not present

## 2023-05-25 DIAGNOSIS — B9710 Unspecified enterovirus as the cause of diseases classified elsewhere: Secondary | ICD-10-CM | POA: Diagnosis present

## 2023-05-25 DIAGNOSIS — E538 Deficiency of other specified B group vitamins: Secondary | ICD-10-CM | POA: Diagnosis not present

## 2023-05-25 DIAGNOSIS — G9341 Metabolic encephalopathy: Secondary | ICD-10-CM | POA: Diagnosis present

## 2023-05-25 DIAGNOSIS — J45909 Unspecified asthma, uncomplicated: Secondary | ICD-10-CM | POA: Diagnosis present

## 2023-05-25 DIAGNOSIS — I21A1 Myocardial infarction type 2: Secondary | ICD-10-CM | POA: Diagnosis not present

## 2023-05-25 DIAGNOSIS — D6959 Other secondary thrombocytopenia: Secondary | ICD-10-CM | POA: Diagnosis present

## 2023-05-25 DIAGNOSIS — D72829 Elevated white blood cell count, unspecified: Secondary | ICD-10-CM

## 2023-05-25 DIAGNOSIS — J9601 Acute respiratory failure with hypoxia: Secondary | ICD-10-CM

## 2023-05-25 DIAGNOSIS — M4646 Discitis, unspecified, lumbar region: Secondary | ICD-10-CM | POA: Diagnosis present

## 2023-05-25 DIAGNOSIS — R4182 Altered mental status, unspecified: Secondary | ICD-10-CM | POA: Diagnosis not present

## 2023-05-25 DIAGNOSIS — G039 Meningitis, unspecified: Secondary | ICD-10-CM

## 2023-05-25 DIAGNOSIS — I10 Essential (primary) hypertension: Secondary | ICD-10-CM | POA: Diagnosis not present

## 2023-05-25 DIAGNOSIS — M7989 Other specified soft tissue disorders: Secondary | ICD-10-CM | POA: Diagnosis not present

## 2023-05-25 DIAGNOSIS — M4626 Osteomyelitis of vertebra, lumbar region: Secondary | ICD-10-CM

## 2023-05-25 DIAGNOSIS — Z515 Encounter for palliative care: Secondary | ICD-10-CM | POA: Diagnosis not present

## 2023-05-25 DIAGNOSIS — Z7951 Long term (current) use of inhaled steroids: Secondary | ICD-10-CM

## 2023-05-25 DIAGNOSIS — I951 Orthostatic hypotension: Secondary | ICD-10-CM | POA: Diagnosis not present

## 2023-05-25 DIAGNOSIS — R410 Disorientation, unspecified: Principal | ICD-10-CM

## 2023-05-25 DIAGNOSIS — I4891 Unspecified atrial fibrillation: Secondary | ICD-10-CM | POA: Diagnosis not present

## 2023-05-25 DIAGNOSIS — Q446 Cystic disease of liver: Secondary | ICD-10-CM | POA: Diagnosis not present

## 2023-05-25 DIAGNOSIS — M2569 Stiffness of other specified joint, not elsewhere classified: Secondary | ICD-10-CM | POA: Diagnosis not present

## 2023-05-25 DIAGNOSIS — B348 Other viral infections of unspecified site: Secondary | ICD-10-CM

## 2023-05-25 DIAGNOSIS — A419 Sepsis, unspecified organism: Principal | ICD-10-CM

## 2023-05-25 DIAGNOSIS — E874 Mixed disorder of acid-base balance: Secondary | ICD-10-CM | POA: Diagnosis present

## 2023-05-25 DIAGNOSIS — R4020 Unspecified coma: Secondary | ICD-10-CM | POA: Diagnosis present

## 2023-05-25 DIAGNOSIS — I428 Other cardiomyopathies: Secondary | ICD-10-CM | POA: Diagnosis present

## 2023-05-25 DIAGNOSIS — Z881 Allergy status to other antibiotic agents status: Secondary | ICD-10-CM

## 2023-05-25 DIAGNOSIS — E66812 Obesity, class 2: Secondary | ICD-10-CM | POA: Diagnosis present

## 2023-05-25 DIAGNOSIS — W19XXXA Unspecified fall, initial encounter: Secondary | ICD-10-CM | POA: Diagnosis present

## 2023-05-25 DIAGNOSIS — B9789 Other viral agents as the cause of diseases classified elsewhere: Secondary | ICD-10-CM | POA: Diagnosis present

## 2023-05-25 DIAGNOSIS — R569 Unspecified convulsions: Secondary | ICD-10-CM | POA: Diagnosis not present

## 2023-05-25 DIAGNOSIS — E162 Hypoglycemia, unspecified: Secondary | ICD-10-CM | POA: Diagnosis not present

## 2023-05-25 DIAGNOSIS — Z7189 Other specified counseling: Secondary | ICD-10-CM | POA: Diagnosis not present

## 2023-05-25 DIAGNOSIS — I491 Atrial premature depolarization: Secondary | ICD-10-CM

## 2023-05-25 DIAGNOSIS — Z90710 Acquired absence of both cervix and uterus: Secondary | ICD-10-CM

## 2023-05-25 DIAGNOSIS — I5020 Unspecified systolic (congestive) heart failure: Secondary | ICD-10-CM | POA: Diagnosis not present

## 2023-05-25 DIAGNOSIS — D649 Anemia, unspecified: Secondary | ICD-10-CM | POA: Diagnosis not present

## 2023-05-25 DIAGNOSIS — Z79899 Other long term (current) drug therapy: Secondary | ICD-10-CM

## 2023-05-25 DIAGNOSIS — I472 Ventricular tachycardia, unspecified: Secondary | ICD-10-CM | POA: Diagnosis not present

## 2023-05-25 DIAGNOSIS — Z9911 Dependence on respirator [ventilator] status: Secondary | ICD-10-CM

## 2023-05-25 DIAGNOSIS — R509 Fever, unspecified: Secondary | ICD-10-CM | POA: Diagnosis not present

## 2023-05-25 LAB — BASIC METABOLIC PANEL WITH GFR
Anion gap: 15 (ref 5–15)
BUN: 11 mg/dL (ref 8–23)
CO2: 21 mmol/L — ABNORMAL LOW (ref 22–32)
Calcium: 8.5 mg/dL — ABNORMAL LOW (ref 8.9–10.3)
Chloride: 100 mmol/L (ref 98–111)
Creatinine, Ser: 0.93 mg/dL (ref 0.44–1.00)
GFR, Estimated: >60 mL/min (ref >60)
Glucose, Bld: 128 mg/dL — ABNORMAL HIGH (ref 70–99)
Potassium: 3.4 mmol/L — ABNORMAL LOW (ref 3.5–5.1)
Sodium: 136 mmol/L (ref 135–145)

## 2023-05-25 LAB — DIFFERENTIAL
Abs Immature Granulocytes: 0.2 10*3/uL — ABNORMAL HIGH (ref 0.00–0.07)
Basophils Absolute: 0.1 10*3/uL (ref 0.0–0.1)
Basophils Relative: 0 %
Eosinophils Absolute: 0.1 10*3/uL (ref 0.0–0.5)
Eosinophils Relative: 0 %
Immature Granulocytes: 1 %
Lymphocytes Relative: 2 %
Lymphs Abs: 0.4 10*3/uL — ABNORMAL LOW (ref 0.7–4.0)
Monocytes Absolute: 0.5 10*3/uL (ref 0.1–1.0)
Monocytes Relative: 3 %
Neutro Abs: 14.4 10*3/uL — ABNORMAL HIGH (ref 1.7–7.7)
Neutrophils Relative %: 94 %

## 2023-05-25 LAB — I-STAT VENOUS BLOOD GAS, ED
Acid-Base Excess: 1 mmol/L (ref 0.0–2.0)
Bicarbonate: 25.1 mmol/L (ref 20.0–28.0)
Calcium, Ion: 0.89 mmol/L — CL (ref 1.15–1.40)
HCT: 49 % — ABNORMAL HIGH (ref 36.0–46.0)
Hemoglobin: 16.7 g/dL — ABNORMAL HIGH (ref 12.0–15.0)
O2 Saturation: 73 %
Potassium: 7.3 mmol/L (ref 3.5–5.1)
Sodium: 134 mmol/L — ABNORMAL LOW (ref 135–145)
TCO2: 26 mmol/L (ref 22–32)
pCO2, Ven: 36.2 mmHg — ABNORMAL LOW (ref 44–60)
pH, Ven: 7.449 — ABNORMAL HIGH (ref 7.25–7.43)
pO2, Ven: 37 mmHg (ref 32–45)

## 2023-05-25 LAB — I-STAT CHEM 8, ED
BUN: 13 mg/dL (ref 8–23)
Calcium, Ion: 1.08 mmol/L — ABNORMAL LOW (ref 1.15–1.40)
Chloride: 101 mmol/L (ref 98–111)
Creatinine, Ser: 0.9 mg/dL (ref 0.44–1.00)
Glucose, Bld: 109 mg/dL — ABNORMAL HIGH (ref 70–99)
HCT: 46 % (ref 36.0–46.0)
Hemoglobin: 15.6 g/dL — ABNORMAL HIGH (ref 12.0–15.0)
Potassium: 3.3 mmol/L — ABNORMAL LOW (ref 3.5–5.1)
Sodium: 141 mmol/L (ref 135–145)
TCO2: 26 mmol/L (ref 22–32)

## 2023-05-25 LAB — URINALYSIS, ROUTINE W REFLEX MICROSCOPIC
Bilirubin Urine: NEGATIVE
Glucose, UA: NEGATIVE mg/dL
Ketones, ur: NEGATIVE mg/dL
Leukocytes,Ua: NEGATIVE
Nitrite: NEGATIVE
Protein, ur: NEGATIVE mg/dL
Specific Gravity, Urine: 1.027 (ref 1.005–1.030)
pH: 8 (ref 5.0–8.0)

## 2023-05-25 LAB — RESPIRATORY PANEL BY PCR

## 2023-05-25 LAB — COMPREHENSIVE METABOLIC PANEL WITH GFR
ALT: 13 U/L (ref 0–44)
AST: 39 U/L (ref 15–41)
Albumin: 2.9 g/dL — ABNORMAL LOW (ref 3.5–5.0)
Alkaline Phosphatase: 70 U/L (ref 38–126)
Anion gap: 13 (ref 5–15)
BUN: 11 mg/dL (ref 8–23)
CO2: 25 mmol/L (ref 22–32)
Calcium: 9 mg/dL (ref 8.9–10.3)
Chloride: 102 mmol/L (ref 98–111)
Creatinine, Ser: 1.02 mg/dL — ABNORMAL HIGH (ref 0.44–1.00)
GFR, Estimated: 58 mL/min — ABNORMAL LOW (ref >60)
Glucose, Bld: 97 mg/dL (ref 70–99)
Potassium: 3.1 mmol/L — ABNORMAL LOW (ref 3.5–5.1)
Sodium: 140 mmol/L (ref 135–145)
Total Bilirubin: 1.2 mg/dL (ref 0.0–1.2)
Total Protein: 7.2 g/dL (ref 6.5–8.1)

## 2023-05-25 LAB — TSH: TSH: 0.954 u[IU]/mL (ref 0.350–4.500)

## 2023-05-25 LAB — RESP PANEL BY RT-PCR (RSV, FLU A&B, COVID)  RVPGX2
Influenza A by PCR: NEGATIVE
Influenza B by PCR: NEGATIVE
Resp Syncytial Virus by PCR: NEGATIVE
SARS Coronavirus 2 by RT PCR: NEGATIVE

## 2023-05-25 LAB — RAPID URINE DRUG SCREEN, HOSP PERFORMED
Amphetamines: NOT DETECTED
Barbiturates: NOT DETECTED
Benzodiazepines: NOT DETECTED
Cocaine: NOT DETECTED
Opiates: NOT DETECTED
Tetrahydrocannabinol: NOT DETECTED

## 2023-05-25 LAB — CBG MONITORING, ED: Glucose-Capillary: 101 mg/dL — ABNORMAL HIGH (ref 70–99)

## 2023-05-25 LAB — CBC
HCT: 45 % (ref 36.0–46.0)
Hemoglobin: 14.8 g/dL (ref 12.0–15.0)
MCH: 28.4 pg (ref 26.0–34.0)
MCHC: 32.9 g/dL (ref 30.0–36.0)
MCV: 86.2 fL (ref 80.0–100.0)
Platelets: 143 10*3/uL — ABNORMAL LOW (ref 150–400)
RBC: 5.22 MIL/uL — ABNORMAL HIGH (ref 3.87–5.11)
RDW: 16.3 % — ABNORMAL HIGH (ref 11.5–15.5)
WBC: 15.6 10*3/uL — ABNORMAL HIGH (ref 4.0–10.5)
nRBC: 0.1 % (ref 0.0–0.2)

## 2023-05-25 LAB — PROTIME-INR
INR: 1.1 (ref 0.8–1.2)
Prothrombin Time: 14.5 s (ref 11.4–15.2)

## 2023-05-25 LAB — POTASSIUM: Potassium: 3 mmol/L — ABNORMAL LOW (ref 3.5–5.1)

## 2023-05-25 LAB — CK
Total CK: 672 U/L — ABNORMAL HIGH (ref 38–234)
Total CK: 718 U/L — ABNORMAL HIGH (ref 38–234)

## 2023-05-25 LAB — APTT: aPTT: 28 s (ref 24–36)

## 2023-05-25 LAB — LACTIC ACID, PLASMA
Lactic Acid, Venous: 3.3 mmol/L (ref 0.5–1.9)
Lactic Acid, Venous: 4.2 mmol/L (ref 0.5–1.9)

## 2023-05-25 LAB — ETHANOL: Alcohol, Ethyl (B): <10 mg/dL (ref <10)

## 2023-05-25 MED ORDER — CALCIUM GLUCONATE-NACL 1-0.675 GM/50ML-% IV SOLN
1.0000 g | Freq: Once | INTRAVENOUS | Status: AC
Start: 2023-05-25 — End: 2023-05-26
  Administered 2023-05-25: 1000 mg via INTRAVENOUS
  Filled 2023-05-25: qty 50

## 2023-05-25 MED ORDER — LACTATED RINGERS IV BOLUS
1000.0000 mL | Freq: Once | INTRAVENOUS | Status: AC
  Administered 2023-05-25: 1000 mL via INTRAVENOUS

## 2023-05-25 MED ORDER — CALCIUM GLUCONATE-NACL 1-0.675 GM/50ML-% IV SOLN
1.0000 g | Freq: Once | INTRAVENOUS | Status: AC
  Administered 2023-05-25: 1000 mg via INTRAVENOUS
  Filled 2023-05-25: qty 50

## 2023-05-25 MED ORDER — DEXTROSE 5 % IV SOLN
1000.0000 mg | Freq: Two times a day (BID) | INTRAVENOUS | Status: DC
  Filled 2023-05-25 (×2): qty 20

## 2023-05-25 MED ORDER — DEXAMETHASONE SODIUM PHOSPHATE 10 MG/ML IJ SOLN: 10.0000 mg | Freq: Four times a day (QID) | INTRAMUSCULAR | Status: DC

## 2023-05-25 MED ORDER — LEVETIRACETAM IN NACL 1000 MG/100ML IV SOLN
1000.0000 mg | INTRAVENOUS | Status: AC
  Administered 2023-05-26 (×2): 1000 mg via INTRAVENOUS
  Filled 2023-05-25: qty 100

## 2023-05-25 MED ORDER — DEXTROSE 5 % IV SOLN
700.0000 mg | Freq: Two times a day (BID) | INTRAVENOUS | Status: DC
  Administered 2023-05-25 – 2023-05-28 (×6): 700 mg via INTRAVENOUS
  Filled 2023-05-25 (×8): qty 14

## 2023-05-25 MED ORDER — VANCOMYCIN HCL IN DEXTROSE 1-5 GM/200ML-% IV SOLN: 1000.0000 mg | Freq: Once | INTRAVENOUS | Status: DC

## 2023-05-25 MED ORDER — SODIUM CHLORIDE 0.9 % IV SOLN
2.0000 g | Freq: Once | INTRAVENOUS | Status: AC
  Administered 2023-05-25: 2 g via INTRAVENOUS
  Filled 2023-05-25: qty 20

## 2023-05-25 MED ORDER — LACTATED RINGERS IV SOLN: INTRAVENOUS | Status: DC

## 2023-05-25 MED ORDER — SODIUM CHLORIDE 0.9 % IV SOLN
2.0000 g | Freq: Three times a day (TID) | INTRAVENOUS | Status: DC
  Administered 2023-05-26 – 2023-05-27 (×4): 2 g via INTRAVENOUS
  Filled 2023-05-25 (×6): qty 2000

## 2023-05-25 MED ORDER — IOHEXOL 350 MG/ML SOLN
100.0000 mL | Freq: Once | INTRAVENOUS | Status: AC | PRN
  Administered 2023-05-25: 100 mL via INTRAVENOUS

## 2023-05-25 MED ORDER — CEFTRIAXONE SODIUM 2 G IJ SOLR: 2.0000 g | Freq: Once | INTRAMUSCULAR | Status: DC

## 2023-05-25 MED ORDER — VANCOMYCIN HCL 2000 MG/400ML IV SOLN
2000.0000 mg | Freq: Once | INTRAVENOUS | Status: AC
Start: 2023-05-25 — End: 2023-05-25
  Administered 2023-05-25: 2000 mg via INTRAVENOUS
  Filled 2023-05-25: qty 400

## 2023-05-25 MED ORDER — VANCOMYCIN HCL 750 MG/150ML IV SOLN
750.0000 mg | Freq: Two times a day (BID) | INTRAVENOUS | Status: DC
  Administered 2023-05-26 – 2023-05-27 (×3): 750 mg via INTRAVENOUS
  Filled 2023-05-25 (×5): qty 150

## 2023-05-25 MED ORDER — AMPICILLIN SODIUM 2 G IJ SOLR
2.0000 g | Freq: Once | INTRAMUSCULAR | Status: AC
  Administered 2023-05-25: 2 g via INTRAVENOUS
  Filled 2023-05-25: qty 2000

## 2023-05-25 MED ORDER — VANCOMYCIN HCL 2000 MG/400ML IV SOLN: 2000.0000 mg | Freq: Once | INTRAVENOUS | Status: DC

## 2023-05-25 NOTE — ED Triage Notes (Signed)
 Pt found unresponsive, child on site told ems they were becoming more confused. Code stroke called.

## 2023-05-25 NOTE — ED Notes (Signed)
 Provider notified of NIHSS score increase, primarily due to inability to follow commands.

## 2023-05-25 NOTE — Hospital Course (Addendum)
 Storke like symptoms, encephelopathy. Last known normal yesterday, moreso encepholpathy. Not following commands. Stroke is unremarkable. On antibiotics. Concern for meningitis.   States it's February, alert and orientated to person, place. Headache has been going on.Has been able to eat a little. Sandwich.

## 2023-05-25 NOTE — ED Notes (Signed)
 CCMD called to add pt to cardiac monitoring.

## 2023-05-25 NOTE — ED Notes (Signed)
 Admitting made aware of VBG results.

## 2023-05-25 NOTE — Consult Note (Signed)
 NEUROLOGY CONSULT NOTE   Date of service: May 25, 2023 Patient Name: Morgan Morgan MRN:  [id number] DOB:  April 27, 1950 Chief Complaint: "CODE STROKE" Requesting Provider: Gerhard Munch, MD  History of Present Illness  Morgan Morgan is a 73 y.o. female with hx of HTN and asthma whi was brought in as an EMS-activated CODE STROKE after being found down this morning by her grandson. Per EMS, he knocked on several neighbors' doors before one answered and then they call 911. On EMS arrival, she was unresponsive with clenched BUE. En route, patient was resisting EMS personnel straightening her arms and against them putting in an IV.   On exam at brdige, patient obtunded, turns her head to voice with a dysconjugate right gaze, but does cross midline, resistance strength BUE, withdraws BLE. Grimaces to pain throughout, minimal verbal output with moaning and guttural sounds.    LKW: last night, unknown exact time.  Modified rankin score: 0-Completely asymptomatic and back to baseline post- stroke Per EMS, lives alone with grandson. No cane or walker seen.  IV Thrombolysis: No, outside of window EVT: No, no LVO   NIHSS components Score: Comment  1a Level of Conscious 0[]  1[x]  2[]  3[]      1b LOC Questions 0[]  1[]  2[x]       1c LOC Commands 0[]  1[]  2[x]       2 Best Gaze 0[]  1[x]  2[]       3 Visual 0[x]  1[]  2[]  3[]      4 Facial Palsy 0[x]  1[]  2[]  3[]      5a Motor Arm - left 0[x]  1[]  2[]  3[]  4[]  UN[]    5b Motor Arm - Right 0[x]  1[]  2[]  3[]  4[]  UN[]    6a Motor Leg - Left 0[]  1[]  2[]  3[x]  4[]  UN[]    6b Motor Leg - Right 0[]  1[]  2[]  3[x]  4[]  UN[]    7 Limb Ataxia 0[]  1[]  2[]  3[]  UN[x]     8 Sensory 0[]  1[]  2[]  UN[x]      9 Best Language 0[]  1[]  2[x]  3[]      10 Dysarthria 0[]  1[]  2[x]  UN[]      11 Extinct. and Inattention 0[x]  1[]  2[]       TOTAL:   10      ROS   Unable to ascertain due to AMS  Past History   Past Medical History:  Diagnosis Date   Asthma    Hypertension     Past Surgical  History:  Procedure Laterality Date   ABDOMINAL HYSTERECTOMY      Family History: Family History  Problem Relation Age of Onset   Asthma Mother    Asthma Brother    Asthma Son    Colon cancer Neg Hx    Colon polyps Neg Hx     Social History  reports that she has never smoked. She does not have any smokeless tobacco history on file. She reports that she does not drink alcohol and does not use drugs.  Allergies  Allergen Reactions   Zithromax [Azithromycin] Itching    Medications  No current facility-administered medications for this encounter.  Current Outpatient Medications:    albuterol (VENTOLIN HFA) 108 (90 Base) MCG/ACT inhaler, Inhale into the lungs every 6 (six) hours as needed for wheezing or shortness of breath., Disp: , Rfl:    amLODipine (NORVASC) 5 MG tablet, Take 5 mg by mouth daily., Disp: , Rfl:    D3 SUPER STRENGTH 50 MCG (2000 UT) CAPS, Take 1 capsule by mouth daily., Disp: , Rfl:    mometasone-formoterol (DULERA) 100-5  MCG/ACT AERO, Inhale 2 puffs into the lungs 2 (two) times daily., Disp: 1 Inhaler, Rfl: 0  Vitals   Vitals:   2023/05/29 1000  Weight: 99.4 kg    Body mass index is 40.08 kg/m.  Physical Exam   Constitutional: Appears elderly and acutely ill. Cardiovascular: Normal rate and regular rhythm.  Respiratory: Effort normal, non-labored breathing. Supplemental oxygen needed.  GI: Soft.  No distension. There is no tenderness.  Skin: No open wounds or rashes seen on exam while clothed  Neurologic Examination   Patient is obtunded. She does open eyes to voice/her name.  Dysconjugate gaze with right gaze preference, increased in right eye. She does cross midline.  Facial movement symmetric. Hearing intact to voice.  Does not cooperate with shoulder shrug or tongue protrusion exam.  BUE: Clenched with resistance to passive straightening or lifting.  BLE: withdraws to pain  Grimaces to pain throughout.    Labs/Imaging/Neurodiagnostic  studies   CBC:  Recent Labs  Lab 05/29/2023 1055  HGB 15.6*  HCT 46.0   Basic Metabolic Panel:  Lab Results  Component Value Date   NA 141 05/29/2023   K 3.3 (L) 05/29/23   CO2 30 10/05/2021   GLUCOSE 109 (H) May 29, 2023   BUN 13 29-May-2023   CREATININE 0.90 05-29-23   CALCIUM 9.9 10/05/2021   GFRNONAA >60 10/05/2021   GFRAA >90 07/27/2012   Lipid Panel: No results found for: "LDLCALC" HgbA1c:  Lab Results  Component Value Date   HGBA1C 5.7 (H) 07/27/2012   CT Head without contrast(Personally reviewed): No CT evidence of acute intracranial abnormality. ASPECTS is 10  CT angio Head and Neck with contrast with Perfusion(Personally reviewed): No LVO NO core infarct on perfusion No high-grade stenosis in neck Mild stenosis bilateral cavernous ICAs  MRI Brain(Personally reviewed): PENDING  Neurodiagnostics rEEG:  PENDING  ASSESSMENT   Morgan Morgan is a 73 y.o. female with hx of HTN and asthma whi was brought in as an EMS-activated CODE STROKE after being found down this morning by her grandson. Per EMS, he knocked on several neighbors' doors before one answered and then they call 911. On EMS arrival, she was unresponsive with clenched BUE. En route, patient was resisting EMS personnel straightening her arms and against them putting in an IV.   On exam at brdige, patient obtunded, turns her head to voice with a dysconjugate right gaze, but does cross midline, resistance strength BUE, withdraws BLE. Grimaces to pain throughout, minimal verbal output with moaning and guttural sounds. No seizure-like activity or rhythmic movements seen.   Recommend MRI to evalaute for stroke and EEG to evaluate for seizure. Evaluate for infectious process to explain her obtunded state/possible etiology for seizure if seen.   Impression: Altered mental status-etiology under investigation  RECOMMENDATIONS   - routine EEG - MRI Brain  routine  ______________________________________________________________________    Pt seen by Neuro NP/APP and later by MD. Note/plan to be edited by MD as needed.    Lynnae January, DNP, AGACNP-BC [facility] Please use AMION for contact information & EPIC for messaging.  Attending Neurohospitalist Addendum Patient seen and examined with APP/Resident. Agree with the history and physical as documented above. Agree with the plan as documented, which I helped formulate. I have independently reviewed the chart, obtained history, review of systems and examined the patient.I have personally reviewed pertinent head/neck/spine imaging (CT/MRI).  Patient brought in as a code stroke upon being found on by child/ Child.  Unclear last known well-likely sometime  yesterday.  On examination, grimaces/withdraws to noxious stimulation in both lower extremities and has good localization in the upper extremities.  Has mildly disconjugate gaze.  Noncontrast head CT and CT angio head and neck with contrast and CT perfusion study of the head personally reviewed-unremarkable for acute process I suspect some sort of underlying toxic metabolic encephalopathy. Urinary toxicology screen is negative. Urinalysis is negative for evidence of acute infection I would recommend getting routine EEG and an MRI of the brain to further evaluate.  Also obtain a chest x-ray  She has leukocytosis and if there is no evidence of a UTI or pneumonia, might need to consider CNS infection.  I would cover her with empiric meningitis coverage antibiotics-vancomycin, ceftriaxone and ampicillin as we wait for the testing.  She is not febrile, she has no neck stiffness or meningitic signs but if her exam does not improve, may consider LP.  Discussed with the EDP Dr. Jeraldine Loots.  Please feel free to call with any questions.  -- Milon Dikes, MD Neurologist [facility] Pager: [phone]

## 2023-05-25 NOTE — Progress Notes (Addendum)
 Pharmacy Antibiotic Note  Morgan Morgan is a 73 y.o. female admitted on 05/25/2023 with acute encephalopathy and concern for meningitis.  Pharmacy has been consulted for acyclovir dosing. Patient received vancomycin, ceftriaxone, and ampicillin in the ED on 05/25/23.   4/9: WBC 15.6, Scr 1.02, Lactate 3.3, Temperature 101.49F  Plan: Vancomycin 750 mg IV every 12 hours Ampicillin per MD Ceftriaxone per MD Acyclovir 700 mg IV every 12 hours LR 125 mL/hr while on acyclovir Monitor renal function, cultures, and clinical signs of improvement Monitor vancomycin levels and de-escalate antibiotics as appropriate  Weight: 99.4 kg (219 lb 2.2 oz)  Temp (24hrs), Avg:100.5 F (38.1 C), Min:99.4 F (37.4 C), Max:101.5 F (38.6 C)  Recent Labs  Lab 05/25/23 1055 05/25/23 1224 05/25/23 1544 05/25/23 1642  WBC  --  15.6*  --   --   CREATININE 0.90  --  1.02*  --   LATICACIDVEN  --   --   --  3.3*    CrCl cannot be calculated (Unknown ideal weight.).    Allergies  Allergen Reactions   Zithromax [Azithromycin] Itching    Antimicrobials this admission: Ampicillin 4/9 x1 Ceftriaxone 4/9 x1 Vancomycin 4/9 x1 Acyclovir 4/9 >>  Microbiology results: 4/9 RVP:  Thank you for allowing pharmacy to be a part of this patient's care.  Enos Fling, PharmD PGY-1 Acute Care Pharmacy Resident 05/25/2023 5:43 PM

## 2023-05-25 NOTE — Consult Note (Signed)
 NAME:  Morgan Morgan, MRN:  [id number], DOB:  07/27/1950, LOS: 0 ADMISSION DATE:  05/25/2023, CONSULTATION DATE:  05/25/2023  REFERRING MD:  Rockne Coons, CHIEF COMPLAINT: Altered mental status  History of Present Illness:  Patient is unresponsive, history obtained via chart review. 73 year old woman BIBEMS as a code stroke after being found down by her grandson.  Initial neuroexam she was obtunded with disconjugate gaze, grimaces to pain, withdrawing both lower extremities and localizing in uppers.  Head CT and CT angio was negative as well as UDS Initial labs significant for mild leukocytosis and thrombocytopenia with lactate of 3.3 and CK of 672 with hypokalemia.  Respiratory viral panel was positive for rhinovirus. She subsequently developed fever She was empirically treated with ceftriaxone, vancomycin and ampicillin with acyclovir Repeat labs showed lactate rising to 4.2, PCCM consulted due to concern for ability to maintain airway EKG showed atrial fibrillation MRI brain was negative  Pertinent  Medical History  Asthma Hypertension  Significant Hospital Events: Including procedures, antibiotic start and stop dates in addition to other pertinent events     Interim History / Subjective:  Unresponsive Defervesced  Objective   Blood pressure (!) 149/93, pulse (!) 40, temperature 98 F (36.7 C), resp. rate (!) 32, height 5\' 1"  (1.549 m), weight 99.4 kg, SpO2 93%.        Intake/Output Summary (Last 24 hours) at 05/25/2023 2302 Last data filed at 05/25/2023 2213 Gross per 24 hour  Intake 269.79 ml  Output --  Net 269.79 ml   Filed Weights   05/25/23 1000  Weight: 99.4 kg    Examination: General: Well-built, well-nourished woman, lying in ED stretcher, no distress HENT: No pallor, icterus, no JVD, no neck stiffness, disconjugate gaze Lungs: Bilateral clear breath sounds, no rhonchi Cardiovascular: S1-S2 regular, no murmur Abdomen: Left, nontender mobile  splenomegaly Extremities: No edema Neuro: Unresponsive, GCS 8, pupils 3 mm reactive to light, plantars bilateral downgoing, withdraws to painful stimuli  Chest x-ray shows mild pulm vascular congestion  Resolved Hospital Problem list     Assessment & Plan:  Acute encephalopathy - Differential includes postictal versus meningitis -she is on empiric meningitis coverage including acyclovir and awaiting LP which is planned for a.m. doubt rhinovirus as the cause - Await EEG - She is currently able to maintain airway but would be at high risk for intubation should her mental status worsen.  Atrial fibrillation, new onset - Holding of anticoagulation due to planned LP and low CHA2DS2-VASc score - Will need echo. -Beta-blockade for rate control as needed  Mild CK elevation -could be related to seizure or due to being found down  Best Practice (right click and "Reselect all SmartList Selections" daily)   Per primary team Code Status:  full code Last date of multidisciplinary goals of care discussion [NA]  Labs   CBC: Recent Labs  Lab 05/25/23 1055 05/25/23 1224 05/25/23 2028  WBC  --  15.6*  --   NEUTROABS  --  14.4*  --   HGB 15.6* 14.8 16.7*  HCT 46.0 45.0 49.0*  MCV  --  86.2  --   PLT  --  143*  --     Basic Metabolic Panel: Recent Labs  Lab 05/25/23 1055 05/25/23 1544 05/25/23 1642 05/25/23 2028  NA 141 140  --  134*  K 3.3* 3.1* 3.0* 7.3*  CL 101 102  --   --   CO2  --  25  --   --   GLUCOSE 109* 97  --   --  BUN 13 11  --   --   CREATININE 0.90 1.02*  --   --   CALCIUM  --  9.0  --   --    GFR: Estimated Creatinine Clearance: 53 mL/min (A) (by C-G formula based on SCr of 1.02 mg/dL (H)). Recent Labs  Lab 05/25/23 1224 05/25/23 1642 05/25/23 2021  WBC 15.6*  --   --   LATICACIDVEN  --  3.3* 4.2*    Liver Function Tests: Recent Labs  Lab 05/25/23 1544  AST 39  ALT 13  ALKPHOS 70  BILITOT 1.2  PROT 7.2  ALBUMIN 2.9*   No results for  input(s): "LIPASE", "AMYLASE" in the last 168 hours. No results for input(s): "AMMONIA" in the last 168 hours.  ABG    Component Value Date/Time   HCO3 25.1 05/25/2023 2028   TCO2 26 05/25/2023 2028   O2SAT 73 05/25/2023 2028     Coagulation Profile: Recent Labs  Lab 05/25/23 1642  INR 1.1    Cardiac Enzymes: Recent Labs  Lab 05/25/23 1642  CKTOTAL 672*    HbA1C: Hgb A1c MFr Bld  Date/Time Value Ref Range Status  07/27/2012 12:13 AM 5.7 (H) <5.7 % Final    Comment:    (NOTE)                                                                       According to the ADA Clinical Practice Recommendations for 2011, when HbA1c is used as a screening test:  >=6.5%   Diagnostic of Diabetes Mellitus           (if abnormal result is confirmed) 5.7-6.4%   Increased risk of developing Diabetes Mellitus References:Diagnosis and Classification of Diabetes Mellitus,Diabetes Care,2011,34(Suppl 1):S62-S69 and Standards of Medical Care in         Diabetes - 2011,Diabetes Care,2011,34 (Suppl 1):S11-S61.    CBG: Recent Labs  Lab 05/25/23 1051  GLUCAP 101*    Review of Systems:   Unable to obtain  Past Medical History:  She,  has a past medical history of Asthma and Hypertension.   Surgical History:   Past Surgical History:  Procedure Laterality Date   ABDOMINAL HYSTERECTOMY       Social History:   reports that she has never smoked. She does not have any smokeless tobacco history on file. She reports that she does not drink alcohol and does not use drugs.   Family History:  Her family history includes Asthma in her brother, mother, and son. There is no history of Colon cancer or Colon polyps.   Allergies Allergies  Allergen Reactions   Zithromax [Azithromycin] Itching     Home Medications  Prior to Admission medications   Medication Sig Start Date End Date Taking? Authorizing Provider  mometasone-formoterol (DULERA) 100-5 MCG/ACT AERO Inhale 2 puffs into the lungs  2 (two) times daily. 07/27/12  Yes Gosrani, Nimish C, MD  albuterol (VENTOLIN HFA) 108 (90 Base) MCG/ACT inhaler Inhale into the lungs every 6 (six) hours as needed for wheezing or shortness of breath.    [provider]  amLODipine (NORVASC) 5 MG tablet Take 5 mg by mouth daily.    [provider]  D3 SUPER STRENGTH 50 MCG (2000 UT)  CAPS Take 1 capsule by mouth daily. 08/03/21   [provider]        Cyril Mourning MD. FCCP. [facility] Pager : [phone]  If no response to pager , please call [phone] until 7 pm After 7:00 pm call Elink  [phone]   05/25/2023

## 2023-05-25 NOTE — H&P (Cosign Needed Addendum)
 Date: 05/25/2023               Patient Name:  Morgan Morgan MRN: [id number]  DOB: 08/30/1950 Age / Sex: 73 y.o., female   PCP: Karl Bales, NP         Medical Service: Internal Medicine Teaching Service         Attending Physician: Dr. Dickie La, MD      First Contact: Mahlon Gammon, [facility] MS4     Second Contact: Laretta Bolster, MD         After Hours (After 5p/  First Contact Pager: [phone]  weekends / holidays): Second Contact Pager: [phone]   SUBJECTIVE   Chief Complaint: altered mental status  History of Present Illness: Mrs. Morgan Morgan is a 73 yo with PMHx of asthma and HTN who presents with AMS after falling this morning. Patient is unable to give [place] history due to AMS. She was not responding to our attempts to arouse her. We did obtain history from her sister-in-law, who said the patient's grandson (55 yo) lives with pt. According to grandson, pt was normal yesterday, grandson said she was "panicking" this morning and then fell without any movements of her arms and legs (last known well is not clearly defined). After she fell she was not responsive and grandson called for help. Grandson did not notice patient complaining of headache or other symptoms. He thinks he had a fever recently.  Spoke to her sisters and brother at bedside who could not provide more information on patient's health history besides asthma and "several hospitalizations" when pt lived in [place]. 4 years ago. Family is unsure if patient is on any blood thinners.  ED Course: Due to acute AMS code stroke was activated. On admission ED nurse was able to arouse patient with sternal rub but could not elicit any verbal response. Non-contrast CT, CT angio head and neck, and CT spine were all unremarkable. Patient had leukocytosis but was afebrile with tachypnea, tachycardia, and elevated BP. EKG revealed atrial fibrillation. UA with no signs of infection, UDS pan-negative, and ethanol normal. She was started on  empiric meningitis treatment with amp, vanc, and ceftriaxone.  Meds:  No outpatient medications have been marked as taking for the 05/25/23 encounter [facility] Encounter).  Per outpatient note in 2023: Albuterol Amlodipine Cholecalciferol Mometasone -formoterol  Past Medical History  Past Surgical History:  Procedure Laterality Date   ABDOMINAL HYSTERECTOMY     Per chart review: Asthma HTN Asymptomatic liver and kidney cysts  Social:  Lives With: 36 yo grandson Occupation: retired Support: family Level of Function: Completes all ADLs on her own PCP: Karl Bales, NP Substances: according to family no alcohol or drug use   Family History: family member with liver and kidney cysts.  Allergies: Allergies as of 05/25/2023 - Reviewed 05/25/2023  Allergen Reaction Noted   Zithromax [azithromycin] Itching 07/26/2012    Review of Systems: A complete ROS was negative except as per HPI.   OBJECTIVE:   Physical Exam: Blood pressure (!) 126/99, pulse 50, temperature 101.5 F, temperature source Axillary, resp. rate (!) 38, weight 99.4 kg, SpO2 94%.  Constitutional: lying in bed, non responsive to touch or painful stimuli, expiratory grunting HENT: normocephalic atraumatic, mucous membranes moist, pinpoint pupils,  PERRLA Eyes: conjunctiva non-erythematous Cardiovascular: tachycardia w/ irregular pulse, unable to hear heart sounds d/t grunting  Pulmonary/Chest: normal work of breathing on room air, lungs clear to auscultation bilaterally Abdominal: soft, did not respond to palpation, non-distended  MSK: normal bulk and tone Neurological: not alert or oriented, grunts in response to sternal rub, not able to move UE or LE on command. Skin: cold LE, dry, no rashes, trace edema bilaterally, small area of erythema w/ bullae on left shin possibly from her fall  Labs: CBC    Component Value Date/Time   WBC 15.6 (H) 05/25/2023 1224   RBC 5.22 (H) 05/25/2023 1224   HGB 14.8  05/25/2023 1224   HCT 45.0 05/25/2023 1224   PLT 143 (L) 05/25/2023 1224   MCV 86.2 05/25/2023 1224   MCH 28.4 05/25/2023 1224   MCHC 32.9 05/25/2023 1224   RDW 16.3 (H) 05/25/2023 1224   LYMPHSABS 0.4 (L) 05/25/2023 1224   MONOABS 0.5 05/25/2023 1224   EOSABS 0.1 05/25/2023 1224   BASOSABS 0.1 05/25/2023 1224     CMP     Component Value Date/Time   NA 140 05/25/2023 1544   K 3.1 (L) 05/25/2023 1544   CL 102 05/25/2023 1544   CO2 25 05/25/2023 1544   GLUCOSE 97 05/25/2023 1544   BUN 11 05/25/2023 1544   CREATININE 1.02 (H) 05/25/2023 1544   CALCIUM 9.0 05/25/2023 1544   PROT 7.2 05/25/2023 1544   ALBUMIN 2.9 (L) 05/25/2023 1544   AST 39 05/25/2023 1544   ALT 13 05/25/2023 1544   ALKPHOS 70 05/25/2023 1544   BILITOT 1.2 05/25/2023 1544   GFRNONAA 58 (L) 05/25/2023 1544   GFRAA >90 07/27/2012 0514   [place]- 3.3 CK- 672  UA: small Hgb, otherwise normal  Imaging:   EKG: Atrial fibrillation,likely w substantial motion artifact  Non-con CT head: No CT evidence of acute intracranial abnormality.   CT cervical spine: No acute fracture or traumatic malalignment of the cervical spine.  CT angio head and neck:  No large vessel occlusion. 2.   No core infarct identified on CT perfusion.  3.   No high-grade stenosis of the carotid and vertebral arteries in the neck.  4.  Atherosclerosis resulting in mild stenosis of the bilateral cavernous ICAs. Otherwise no stenosis of the intracranial  arterial vasculature.  5.  Prominence of the bilateral optic nerves. Consider correlation with fundoscopic exam. 6.  Aortic Atherosclerosis    MRI brain wo contrast:  1. Intermittently motion degraded examination. 2. No evidence of an acute intracranial abnormality. The diffusion-weighted imaging is of good quality and there is no evidence of an acute infarct. 3. Mild cerebral white matter chronic small vessel ischemic disease. 4. Paranasal sinus mucosal thickening as  described.  Chest x-ray: Mild cardiomegaly with possible mild central pulmonary vascular congestion.  ASSESSMENT & PLAN:   Assessment & Plan by Problem: Principal Problem:   Altered mental status   Letishia Elliott is a 73 y.o. person living with a history of asthma, hypertension, and asymptomatic liver and kidney cysts who presented with altered mental status admitted for encephalopathy workup on hospital day 0.  Acute encephalopathy Patient presented with AMS after a reported fall this morning but we are unable to get clearly defined last known well. UDS, ethanol level, and BMP are within normal limits ruling out metabolic causes of AMS. Structural causes are unlikely given all brain imaging has been unremarkable. We will get EEG to assess for nonconvulsive status epilepticus as possible etiology. She has a leukocytosis, elevated lactic acid, and has become febrile over past hour making an infectious etiology likely. She had a normal UA and no signs of infection on chest x-ray. Encephalitis is possible given her rapid  decline but no signs of encephalitis seen on CT/MRI. Broad spectrum empiric antibiotics were started at 11 am for meningitis. Neurology saw patient shortly after arrival and recommended empirically treating for meningitis while getting further brain imaging and EEG. Patient's neuro exam has worsened since she saw neurology at 11 am, she is now not responding to painful stimuli and not opening her eyes. Neurology agrees with adding acyclovir to empiric meningitis coverage.  -continue Amp, vanc, and ceftriaxone -start Acyclovir - LP - EEG - follow up TSH and free T4 -follow up respiratory panel results   Atrial Fibrillation We were unable to get a recent medical history from her family, but patient was found to have Afib on EKG which was not present in 2023 per chart review. Her chest-xray did have signs of cardiomegaly and possible pulmonary vascular congestion. She does not have  signs of volume overload on exam although her lungs were difficult to listen to given loud grunting. -get repeat EKG and monitor with telemetry  -continue to monitor volume status -follow up TSH and free T4  Diet: NPO VTE: SCDs IVF: None Code: Full code  Prior to Admission Living Arrangement: home w/grandson Anticipated Discharge Location: pending medical stabilization Barriers to Discharge: pending medical stabilization  Dispo: Admit patient to Inpatient with expected length of stay greater than 2 midnights.  Signed: Mahlon Gammon, Medical Student  05/25/2023, 6:29 PM

## 2023-05-25 NOTE — ED Provider Notes (Signed)
  EMERGENCY DEPARTMENT AT [facility] Provider Note   CSN: [id number] Arrival date & time: 05/25/23  1047  An emergency department physician performed an initial assessment on this suspected stroke patient at 0920.  History  No chief complaint on file.   Morgan Morgan is a 73 y.o. female.  HPI Patient presents as a code stroke.  Level 5 caveat secondary to confusion.  EMS reports the patient has unknown last seen normal, was found minimally responsive on the floor of her house.  Chart review, EMS history notable for history of hypertension, obesity, no known use of antithrombotic.    Home Medications Prior to Admission medications   Medication Sig Start Date End Date Taking? Authorizing Provider  albuterol (VENTOLIN HFA) 108 (90 Base) MCG/ACT inhaler Inhale into the lungs every 6 (six) hours as needed for wheezing or shortness of breath.    [provider]  amLODipine (NORVASC) 5 MG tablet Take 5 mg by mouth daily.    [provider]  D3 SUPER STRENGTH 50 MCG (2000 UT) CAPS Take 1 capsule by mouth daily. 08/03/21   [provider]  mometasone-formoterol (DULERA) 100-5 MCG/ACT AERO Inhale 2 puffs into the lungs 2 (two) times daily. 07/27/12   Wilson Singer, MD      Allergies    Zithromax [azithromycin]    Review of Systems   Review of Systems  Physical Exam Updated Vital Signs BP (!) 171/95   Pulse 81   Temp 99.4 F (37.4 C) (Axillary)   Resp (!) 29   Wt 99.4 kg   SpO2 94%   BMI 40.08 kg/m  Physical Exam Vitals and nursing note reviewed.  Constitutional:      Appearance: She is well-developed. She is obese. She is ill-appearing and diaphoretic.  HENT:     Head: Normocephalic and atraumatic.  Eyes:     Conjunctiva/sclera: Conjunctivae normal.  Cardiovascular:     Rate and Rhythm: Normal rate and regular rhythm.  Pulmonary:     Effort: Pulmonary effort is normal. No respiratory distress.     Breath sounds: Normal  breath sounds. No stridor.  Abdominal:     General: There is no distension.  Skin:    General: Skin is warm.  Neurological:     Cranial Nerves: No cranial nerve deficit.     Comments: Moves all extremities spontaneously, vigorously, but not to command.  Psychiatric:        Cognition and Memory: Cognition is impaired.     ED Results / Procedures / Treatments   Labs (all labs ordered are listed, but only abnormal results are displayed) Labs Reviewed  CBC - Abnormal; Notable for the following components:      Result Value   WBC 15.6 (*)    RBC 5.22 (*)    RDW 16.3 (*)    Platelets 143 (*)    All other components within normal limits  DIFFERENTIAL - Abnormal; Notable for the following components:   Neutro Abs 14.4 (*)    Lymphs Abs 0.4 (*)    Abs Immature Granulocytes 0.20 (*)    All other components within normal limits  URINALYSIS, ROUTINE W REFLEX MICROSCOPIC - Abnormal; Notable for the following components:   APPearance CLOUDY (*)    Hgb urine dipstick SMALL (*)    Bacteria, UA RARE (*)    All other components within normal limits  I-STAT CHEM 8, ED - Abnormal; Notable for the following components:   Potassium 3.3 (*)  Glucose, Bld 109 (*)    Calcium, Ion 1.08 (*)    Hemoglobin 15.6 (*)    All other components within normal limits  CBG MONITORING, ED - Abnormal; Notable for the following components:   Glucose-Capillary 101 (*)    All other components within normal limits  RESP PANEL BY RT-PCR (RSV, FLU A&B, COVID)  RVPGX2  ETHANOL  RAPID URINE DRUG SCREEN, HOSP PERFORMED  COMPREHENSIVE METABOLIC PANEL WITH GFR  PROTIME-INR  APTT    EKG EKG Interpretation Date/Time:  Wednesday May 25 2023 11:53:51 EDT Ventricular Rate:  116 PR Interval:    QRS Duration:  135 QT Interval:  350 QTC Calculation: 487 R Axis:   88  Text Interpretation: Atrial fibrillation  ,likely w substantial arefact - motion Confirmed by Gerhard Munch [phone]) on 05/25/2023 12:07:20  PM  Radiology CT C-SPINE NO CHARGE Result Date: 05/25/2023 CLINICAL DATA:  Syncope EXAM: CT CERVICAL SPINE WITHOUT CONTRAST TECHNIQUE: Multidetector CT imaging of the cervical spine was performed without intravenous contrast. Multiplanar CT image reconstructions were also generated. RADIATION DOSE REDUCTION: This exam was performed according to the departmental dose-optimization program which includes automated exposure control, adjustment of the mA and/or kV according to patient size and/or use of iterative reconstruction technique. COMPARISON:  Same day CT head. FINDINGS: Alignment: Alignment is maintained. No listhesis. No facet subluxation or dislocation. Skull base and vertebrae: No compression fracture or displaced fracture in the cervical spine. No suspicious osseous lesion. Soft tissues and spinal canal: No prevertebral fluid or swelling. No visible canal hematoma. Disc levels: Disc space narrowing at C5-6 and C6-7. Disc osteophyte complexes at C5-6 and C6-7 resulting in mild spinal canal stenosis. Facet arthrosis at multiple levels. Foraminal narrowing is most pronounced on the left at C5-6 and C6-7. Upper chest: Negative. Other: None. IMPRESSION: No acute fracture or traumatic malalignment of the cervical spine. Electronically Signed   By: Emily Filbert M.D.   On: 05/25/2023 14:16   CT ANGIO HEAD NECK W WO CM W PERF (CODE STROKE) Result Date: 05/25/2023 CLINICAL DATA:  Neuro deficit, concern for stroke, syncope. EXAM: CT ANGIOGRAPHY HEAD AND NECK CT PERFUSION BRAIN TECHNIQUE: Multidetector CT imaging of the head and neck was performed using the standard protocol during bolus administration of intravenous contrast. Multiplanar CT image reconstructions and MIPs were obtained to evaluate the vascular anatomy. Carotid stenosis measurements (when applicable) are obtained utilizing NASCET criteria, using the distal internal carotid diameter as the denominator. Multiphase CT imaging of the brain was  performed following IV bolus contrast injection. Subsequent parametric perfusion maps were calculated using RAPID software. RADIATION DOSE REDUCTION: This exam was performed according to the departmental dose-optimization program which includes automated exposure control, adjustment of the mA and/or kV according to patient size and/or use of iterative reconstruction technique. CONTRAST:  OMNIPAQUE IOHEXOL 350 MG/ML SOLN COMPARISON:  Same-day head CT. FINDINGS: CT Brain Perfusion Findings: ASPECTS: 10 CBF (<30%) Volume: 0mL Perfusion (Tmax>6.0s) volume: 6mL Mismatch Volume: 6mL Infarction Location:Infinite CTA NECK FINDINGS Aortic arch: Standard configuration of the aortic arch. Imaged portion shows no evidence of aneurysm or dissection. Mild atherosclerosis of the aortic arch. No significant stenosis of the major arch vessel origins. Pulmonary arteries: As permitted by contrast timing, there are no filling defects in the visualized pulmonary arteries. Subclavian arteries: The subclavian arteries are patent bilaterally. Right carotid system: Patent. Mild atherosclerosis at the carotid bifurcation without hemodynamically significant stenosis. No evidence of dissection. Left carotid system: No evidence of dissection, stenosis (50% or greater),  or occlusion. Vertebral arteries: Codominant. No evidence of dissection, stenosis (50% or greater), or occlusion. Skeleton: No acute findings. Degenerative changes in the cervical spine. Other neck: Redemonstrated prominence of the bilateral optic nerves and possible tortuosity of the optic nerves. The visualized airway is patent. No cervical lymphadenopathy. Upper chest: Visualized lung apices are clear. Review of the MIP images confirms the above findings CTA HEAD FINDINGS ANTERIOR CIRCULATION: The intracranial ICAs are patent bilaterally. Atherosclerosis of the carotid siphons resulting in mild stenosis of the bilateral cavernous ICAs. No high-grade stenosis, proximal  occlusion, aneurysm, or vascular malformation. MCAs: The middle cerebral arteries are patent bilaterally. ACAs: The anterior cerebral arteries are patent bilaterally. POSTERIOR CIRCULATION: No significant stenosis, proximal occlusion, aneurysm, or vascular malformation. PCAs: The posterior cerebral arteries are patent bilaterally. Pcomm: The posterior communicating arteries are visualized bilaterally. SCAs: The superior cerebellar arteries are patent bilaterally. Basilar artery: Patent AICAs: Patent PICAs: Patent Vertebral arteries: The intracranial vertebral arteries are patent. Venous sinuses: As permitted by contrast timing, patent. Anatomic variants: None Review of the MIP images confirms the above findings IMPRESSION: No large vessel occlusion. No core infarct identified on CT perfusion. Small focus of elevated T-max in left frontal lobe is likely artifactual. No high-grade stenosis of the carotid and vertebral arteries in the neck. Atherosclerosis resulting in mild stenosis of the bilateral cavernous ICAs. Otherwise no stenosis of the intracranial arterial vasculature. Prominence of the bilateral optic nerves. Consider correlation with fundoscopic exam. Aortic Atherosclerosis (ICD10-I70.0). These results were called by telephone at the time of interpretation on 05/25/2023 at 11:26 am to provider Dr. Wilford Corner, Who verbally acknowledged these results. Electronically Signed   By: Emily Filbert M.D.   On: 05/25/2023 11:41   CT HEAD CODE STROKE WO CONTRAST Result Date: 05/25/2023 CLINICAL DATA:  Code stroke.  Neuro deficit, syncope. EXAM: CT HEAD WITHOUT CONTRAST TECHNIQUE: Contiguous axial images were obtained from the base of the skull through the vertex without intravenous contrast. RADIATION DOSE REDUCTION: This exam was performed according to the departmental dose-optimization program which includes automated exposure control, adjustment of the mA and/or kV according to patient size and/or use of iterative  reconstruction technique. COMPARISON:  None Available. FINDINGS: Brain: No acute intracranial hemorrhage. No CT evidence of acute infarct. Nonspecific hypoattenuation in the periventricular and subcortical white matter favored to reflect chronic microvascular ischemic changes. No edema, mass effect, or midline shift. The basilar cisterns are patent. Ventricles: The ventricles are normal. Vascular: Atherosclerotic calcifications of the carotid siphons. No hyperdense vessel. Skull: No acute or aggressive finding. Orbits: Orbits are symmetric. There is suggestion of prominence of the bilateral optic nerves. Sinuses: Mucosal thickening throughout the visualized paranasal sinuses. No air-fluid levels. Other: Mastoid air cells are clear. ASPECTS [facility] Stroke Program Early CT Score) - Ganglionic level infarction (caudate, lentiform nuclei, internal capsule, insula, M1-M3 cortex): 7 - Supraganglionic infarction (M4-M6 cortex): 3 Total score (0-10 with 10 being normal): 10 IMPRESSION: 1. No CT evidence of acute intracranial abnormality. 2. ASPECTS is 10 These results were communicated to Dr. Kathyrn Lass At 11:16 am on 05/25/2023 by telephone call. Electronically Signed   By: Emily Filbert M.D.   On: 05/25/2023 11:21    Procedures Procedures    Medications Ordered in ED Medications  cefTRIAXone (ROCEPHIN) 2 g in sodium chloride 0.9 % 100 mL IVPB (has no administration in time range)  ampicillin (OMNIPEN) 2 g in sodium chloride 0.9 % 100 mL IVPB (has no administration in time range)  vancomycin (VANCOREADY) IVPB 2000 mg/400  mL (has no administration in time range)  iohexol (OMNIPAQUE) 350 MG/ML injection 100 mL (100 mLs Intravenous Contrast Given 05/25/23 1109)    ED Course/ Medical Decision Making/ A&P                                 Medical Decision Making Obese adult female with reported history of hypertension presents with gross encephalopathy.  Last seen normally unknown.  Patient with expeditious  evaluation as a code stroke, including evaluation by our neurology colleagues.  Patient has preserved airway, but is not appropriately interactive.  Broad differential including stroke, encephalopathy, infection, bacteremia, sepsis. Cardiac 80 A-fib abnormal Pulse ox 94% room air normal   Amount and/or Complexity of Data Reviewed Independent Historian: EMS Labs: ordered. Decision-making details documented in ED Course. Radiology: ordered and independent interpretation performed. Decision-making details documented in ED Course. ECG/medicine tests: ordered and independent interpretation performed. Decision-making details documented in ED Course.  Risk Prescription drug management. Decision regarding hospitalization. Diagnosis or treatment significantly limited by social determinants of health.  Update: Patient in similar condition.  Since my recent evaluation of the patient while she was undergoing CT scan, she continues to move all extremities, is nonpurposeful, not following commands.  Following result of CT imaging discussed her case with our neurology colleagues, patient awaiting EEG, MRI, and with persistent encephalopathy likely admission.  Patient remains afebrile, but with mild leukocytosis, patient will have broad-spectrum antibiotic coverage given presentation for delirium, without obvious source.  COVID/flu/RSV panel pending on admission.        Final Clinical Impression(s) / ED Diagnoses Final diagnoses:  Delirium    Rx / DC Orders ED Discharge Orders     None         Gerhard Munch, MD 05/25/23 1447

## 2023-05-25 NOTE — ED Notes (Signed)
 Admitting provider made aware of pts current GCS and overall status.

## 2023-05-25 NOTE — Code Documentation (Signed)
 Stroke Response Nurse Documentation Code Documentation  Morgan Morgan is a 73 y.o. female arriving to [facility]  via  [facility]  on 4/9 with past medical hx of HTN, obesity. On No antithrombotic. Code stroke was activated by EMS.   Patient from home where she was LKW at unknown time last night and was found minimally responsive on the floor of her house.   Stroke team at the bedside on patient arrival. Labs drawn and patient cleared for CT by Dr. Fredderick Phenix. Patient to CT with team. NIHSS 15, see documentation for details and code stroke times. Patient with decreased LOC, disoriented, not following commands, bilateral leg weakness, Global aphasia , and dysarthria  on exam. The following imaging was completed:  CT Head, CTA, and CTP. Patient is not a candidate for IV Thrombolytic due to LKW yesterday. Patient is not a candidate for IR due to no LVO on CTA.   Care Plan: q2 NIHSS and vitals x 12 hours, then q4.   Bedside handoff with ED RN Janyth Pupa.    Pearlie Oyster  Stroke Response RN

## 2023-05-25 NOTE — ED Notes (Signed)
 This RN called main lab regarding the blood sent down at at 2021. Per main lab, sample was hemolyzed and they decided to run the ordered lab test on the 1642 sample. provider made aware, blood sample recollected.

## 2023-05-25 NOTE — Progress Notes (Addendum)
 Evaluated patient at bedside.  In brief this is a 73 year old female presenting with acute altered mental status. Workup thus far has been relatively unrevealing of cause.  No structural changes on MRI, no clear metabolic derangement.  Broad-spectrum antibiotics with vancomycin, ceftriaxone, and ampicillin started.  Acyclovir has been started as well.   Respiratory panel resulting with rhinovirus/enterovirus.  Stat VBG demonstrating respiratory alkalosis, potassium critical 7.3, calcium critical 0.89.  This is new from admission chemistries, and will be repeated.  CK elevated. Lactic climbing.  Stat EKG ordered, calcium gluconate given.  On exam, patient is persistently encephalopathic.  GCS calculated to be 7, withdraws from pain, does not open eyes, incomprehensible speech.  Gaze is disconjugate, eyes otherwise equal round and reactive to light.  She does appear to have some increased neck stiffness.  Kernig's and Brezinski's equivocal. Irregularly irregular rate and rhythm.  Lungs relatively clear to auscultation, some possible rales at the bases.  No rashes appreciated.  Patient is tachypneic into the high 30s, SPO2 in the 90s.  Unclear etiology at this time, may represent a viral meningitis or encephalitis given fever, white blood cell count, possible meningitic signs on exam, positive enterovirus PCR.  Seizure on differential given gaze, CK elevation. EEG pending. Neurology consulted and following, tentatively plan for lumbar puncture in a.m.  Given patient's decreased GCS, tachypnea, possible significant metabolic derangement (though potentially aberrant), we will reach out to PCCM as well for evaluation.  Addendum 23:06:  EKG w/o clear signs of hyperkalemia Lactic acid climbing, will Bolus 1L NS Potassium labs hemolyzed, repeating BMP, Will trend CK 1L LR bolus given  PCCM to assess patient, evaluate airway, appreciate their help  Repeat BMP w/ Potassium 3.4, creatinine 0.93. CK slightly  increased to 718 Repeat Lactic pending. Will trend q3 hrs.

## 2023-05-25 NOTE — ED Notes (Addendum)
 Saad MD made aware on lactic acid 3.3 and axillary of 101.5 F along with other vitals by face to face conversation.

## 2023-05-26 ENCOUNTER — Inpatient Hospital Stay (HOSPITAL_COMMUNITY)

## 2023-05-26 ENCOUNTER — Encounter (HOSPITAL_COMMUNITY)

## 2023-05-26 ENCOUNTER — Ambulatory Visit (HOSPITAL_COMMUNITY)

## 2023-05-26 DIAGNOSIS — E876 Hypokalemia: Secondary | ICD-10-CM

## 2023-05-26 DIAGNOSIS — M7989 Other specified soft tissue disorders: Secondary | ICD-10-CM | POA: Diagnosis not present

## 2023-05-26 DIAGNOSIS — R509 Fever, unspecified: Secondary | ICD-10-CM | POA: Diagnosis not present

## 2023-05-26 DIAGNOSIS — R6521 Severe sepsis with septic shock: Secondary | ICD-10-CM | POA: Diagnosis not present

## 2023-05-26 DIAGNOSIS — A419 Sepsis, unspecified organism: Principal | ICD-10-CM

## 2023-05-26 DIAGNOSIS — G9340 Encephalopathy, unspecified: Secondary | ICD-10-CM

## 2023-05-26 DIAGNOSIS — R4182 Altered mental status, unspecified: Secondary | ICD-10-CM

## 2023-05-26 DIAGNOSIS — J9601 Acute respiratory failure with hypoxia: Secondary | ICD-10-CM | POA: Diagnosis not present

## 2023-05-26 DIAGNOSIS — I4891 Unspecified atrial fibrillation: Secondary | ICD-10-CM | POA: Diagnosis not present

## 2023-05-26 DIAGNOSIS — B348 Other viral infections of unspecified site: Secondary | ICD-10-CM

## 2023-05-26 DIAGNOSIS — R569 Unspecified convulsions: Secondary | ICD-10-CM

## 2023-05-26 DIAGNOSIS — N179 Acute kidney failure, unspecified: Secondary | ICD-10-CM

## 2023-05-26 LAB — CBC WITH DIFFERENTIAL/PLATELET
Abs Immature Granulocytes: 0.52 10*3/uL — ABNORMAL HIGH (ref 0.00–0.07)
Basophils Absolute: 0.1 10*3/uL (ref 0.0–0.1)
Basophils Relative: 1 %
Eosinophils Absolute: 0 10*3/uL (ref 0.0–0.5)
Eosinophils Relative: 0 %
HCT: 44.1 % (ref 36.0–46.0)
Hemoglobin: 15 g/dL (ref 12.0–15.0)
Immature Granulocytes: 2 %
Lymphocytes Relative: 3 %
Lymphs Abs: 0.6 10*3/uL — ABNORMAL LOW (ref 0.7–4.0)
MCH: 28.4 pg (ref 26.0–34.0)
MCHC: 34 g/dL (ref 30.0–36.0)
MCV: 83.4 fL (ref 80.0–100.0)
Monocytes Absolute: 1.4 10*3/uL — ABNORMAL HIGH (ref 0.1–1.0)
Monocytes Relative: 6 %
Neutro Abs: 19.1 10*3/uL — ABNORMAL HIGH (ref 1.7–7.7)
Neutrophils Relative %: 88 %
Platelets: 197 10*3/uL (ref 150–400)
RBC: 5.29 MIL/uL — ABNORMAL HIGH (ref 3.87–5.11)
RDW: 16 % — ABNORMAL HIGH (ref 11.5–15.5)
Smear Review: NORMAL
WBC: 21.7 10*3/uL — ABNORMAL HIGH (ref 4.0–10.5)
nRBC: 0.1 % (ref 0.0–0.2)

## 2023-05-26 LAB — GLUCOSE, CAPILLARY
Glucose-Capillary: 100 mg/dL — ABNORMAL HIGH (ref 70–99)
Glucose-Capillary: 102 mg/dL — ABNORMAL HIGH (ref 70–99)
Glucose-Capillary: 114 mg/dL — ABNORMAL HIGH (ref 70–99)
Glucose-Capillary: 95 mg/dL (ref 70–99)
Glucose-Capillary: 95 mg/dL (ref 70–99)
Glucose-Capillary: 96 mg/dL (ref 70–99)

## 2023-05-26 LAB — CBC
HCT: 34 % — ABNORMAL LOW (ref 36.0–46.0)
HCT: 44.4 % (ref 36.0–46.0)
Hemoglobin: 9.6 g/dL — ABNORMAL LOW (ref 12.0–15.0)
Hemoglobin: 14.8 g/dL (ref 12.0–15.0)
MCH: 28.7 pg (ref 26.0–34.0)
MCH: 28.1 pg (ref 26.0–34.0)
MCHC: 28.2 g/dL — ABNORMAL LOW (ref 30.0–36.0)
MCHC: 33.3 g/dL (ref 30.0–36.0)
MCV: 99.7 fL (ref 80.0–100.0)
MCV: 84.4 fL (ref 80.0–100.0)
Platelets: 118 10*3/uL — ABNORMAL LOW (ref 150–400)
Platelets: 178 10*3/uL (ref 150–400)
RBC: 3.35 MIL/uL — ABNORMAL LOW (ref 3.87–5.11)
RBC: 5.26 MIL/uL — ABNORMAL HIGH (ref 3.87–5.11)
RDW: 17.8 % — ABNORMAL HIGH (ref 11.5–15.5)
RDW: 16.1 % — ABNORMAL HIGH (ref 11.5–15.5)
WBC: 15.9 10*3/uL — ABNORMAL HIGH (ref 4.0–10.5)
WBC: 23.1 10*3/uL — ABNORMAL HIGH (ref 4.0–10.5)
nRBC: 0 % (ref 0.0–0.2)
nRBC: 0 % (ref 0.0–0.2)

## 2023-05-26 LAB — POCT I-STAT 7, (LYTES, BLD GAS, ICA,H+H)
Acid-base deficit: 1 mmol/L (ref 0.0–2.0)
Acid-base deficit: 2 mmol/L (ref 0.0–2.0)
Bicarbonate: 26.4 mmol/L (ref 20.0–28.0)
Bicarbonate: 21.9 mmol/L (ref 20.0–28.0)
Calcium, Ion: 1.18 mmol/L (ref 1.15–1.40)
Calcium, Ion: 1.13 mmol/L — ABNORMAL LOW (ref 1.15–1.40)
HCT: 44 % (ref 36.0–46.0)
HCT: 44 % (ref 36.0–46.0)
Hemoglobin: 15 g/dL (ref 12.0–15.0)
Hemoglobin: 15 g/dL (ref 12.0–15.0)
O2 Saturation: 100 %
O2 Saturation: 97 %
Patient temperature: 38.6
Patient temperature: 99
Potassium: 3.6 mmol/L (ref 3.5–5.1)
Potassium: 3.9 mmol/L (ref 3.5–5.1)
Sodium: 138 mmol/L (ref 135–145)
Sodium: 139 mmol/L (ref 135–145)
TCO2: 28 mmol/L (ref 22–32)
TCO2: 23 mmol/L (ref 22–32)
pCO2 arterial: 58.3 mmHg — ABNORMAL HIGH (ref 32–48)
pCO2 arterial: 34.8 mmHg (ref 32–48)
pH, Arterial: 7.273 — ABNORMAL LOW (ref 7.35–7.45)
pH, Arterial: 7.409 (ref 7.35–7.45)
pO2, Arterial: 320 mmHg — ABNORMAL HIGH (ref 83–108)
pO2, Arterial: 88 mmHg (ref 83–108)

## 2023-05-26 LAB — BASIC METABOLIC PANEL WITH GFR
Anion gap: 15 (ref 5–15)
Anion gap: 14 (ref 5–15)
Anion gap: 10 (ref 5–15)
BUN: 13 mg/dL (ref 8–23)
BUN: 16 mg/dL (ref 8–23)
BUN: 21 mg/dL (ref 8–23)
CO2: 22 mmol/L (ref 22–32)
CO2: 20 mmol/L — ABNORMAL LOW (ref 22–32)
CO2: 23 mmol/L (ref 22–32)
Calcium: 8.6 mg/dL — ABNORMAL LOW (ref 8.9–10.3)
Calcium: 8.1 mg/dL — ABNORMAL LOW (ref 8.9–10.3)
Calcium: 7.8 mg/dL — ABNORMAL LOW (ref 8.9–10.3)
Chloride: 102 mmol/L (ref 98–111)
Chloride: 99 mmol/L (ref 98–111)
Chloride: 105 mmol/L (ref 98–111)
Creatinine, Ser: 1.07 mg/dL — ABNORMAL HIGH (ref 0.44–1.00)
Creatinine, Ser: 1.19 mg/dL — ABNORMAL HIGH (ref 0.44–1.00)
Creatinine, Ser: 1.05 mg/dL — ABNORMAL HIGH (ref 0.44–1.00)
GFR, Estimated: 55 mL/min — ABNORMAL LOW (ref >60)
GFR, Estimated: 48 mL/min — ABNORMAL LOW (ref >60)
GFR, Estimated: 56 mL/min — ABNORMAL LOW (ref >60)
Glucose, Bld: 95 mg/dL (ref 70–99)
Glucose, Bld: 253 mg/dL — ABNORMAL HIGH (ref 70–99)
Glucose, Bld: 107 mg/dL — ABNORMAL HIGH (ref 70–99)
Potassium: 3.2 mmol/L — ABNORMAL LOW (ref 3.5–5.1)
Potassium: 4.1 mmol/L (ref 3.5–5.1)
Potassium: 3.4 mmol/L — ABNORMAL LOW (ref 3.5–5.1)
Sodium: 139 mmol/L (ref 135–145)
Sodium: 133 mmol/L — ABNORMAL LOW (ref 135–145)
Sodium: 138 mmol/L (ref 135–145)

## 2023-05-26 LAB — ECHOCARDIOGRAM COMPLETE
AR max vel: 1.22 cm2
AV Area VTI: 1.74 cm2
AV Area mean vel: 1.18 cm2
AV Mean grad: 3.7 mmHg
AV Peak grad: 6.7 mmHg
Ao pk vel: 1.3 m/s
Area-P 1/2: 9.1 cm2
Calc EF: 22.4 %
Height: 61 in
S' Lateral: 5.4 cm
Single Plane A2C EF: 22.8 %
Single Plane A4C EF: 19.8 %
Weight: 3114.66 [oz_av]

## 2023-05-26 LAB — HEMOGLOBIN A1C
Hgb A1c MFr Bld: 5.6 % (ref 4.8–5.6)
Mean Plasma Glucose: 114.02 mg/dL

## 2023-05-26 LAB — I-STAT VENOUS BLOOD GAS, ED
Acid-base deficit: 12 mmol/L — ABNORMAL HIGH (ref 0.0–2.0)
Bicarbonate: 12.2 mmol/L — ABNORMAL LOW (ref 20.0–28.0)
Calcium, Ion: 1.01 mmol/L — ABNORMAL LOW (ref 1.15–1.40)
HCT: 27 % — ABNORMAL LOW (ref 36.0–46.0)
Hemoglobin: 9.2 g/dL — ABNORMAL LOW (ref 12.0–15.0)
O2 Saturation: 99 %
Potassium: 3.8 mmol/L (ref 3.5–5.1)
Sodium: 134 mmol/L — ABNORMAL LOW (ref 135–145)
TCO2: 13 mmol/L — ABNORMAL LOW (ref 22–32)
pCO2, Ven: 21.7 mmHg — ABNORMAL LOW (ref 44–60)
pH, Ven: 7.358 (ref 7.25–7.43)
pO2, Ven: 134 mmHg — ABNORMAL HIGH (ref 32–45)

## 2023-05-26 LAB — URINALYSIS, W/ REFLEX TO CULTURE (INFECTION SUSPECTED)
Bilirubin Urine: NEGATIVE
Glucose, UA: NEGATIVE mg/dL
Ketones, ur: NEGATIVE mg/dL
Leukocytes,Ua: NEGATIVE
Nitrite: NEGATIVE
Protein, ur: NEGATIVE mg/dL
Specific Gravity, Urine: 1.02 (ref 1.005–1.030)
pH: 5 (ref 5.0–8.0)

## 2023-05-26 LAB — I-STAT CHEM 8, ED
BUN: 6 mg/dL — ABNORMAL LOW (ref 8–23)
Calcium, Ion: 0.99 mmol/L — ABNORMAL LOW (ref 1.15–1.40)
Chloride: 102 mmol/L (ref 98–111)
Creatinine, Ser: 0.3 mg/dL — ABNORMAL LOW (ref 0.44–1.00)
Glucose, Bld: 44 mg/dL — CL (ref 70–99)
HCT: 28 % — ABNORMAL LOW (ref 36.0–46.0)
Hemoglobin: 9.5 g/dL — ABNORMAL LOW (ref 12.0–15.0)
Potassium: 3.8 mmol/L (ref 3.5–5.1)
Sodium: 134 mmol/L — ABNORMAL LOW (ref 135–145)
TCO2: 14 mmol/L — ABNORMAL LOW (ref 22–32)

## 2023-05-26 LAB — POCT I-STAT, CHEM 8
BUN: 14 mg/dL (ref 8–23)
Calcium, Ion: 1.1 mmol/L — ABNORMAL LOW (ref 1.15–1.40)
Chloride: 103 mmol/L (ref 98–111)
Creatinine, Ser: 1.1 mg/dL — ABNORMAL HIGH (ref 0.44–1.00)
Glucose, Bld: 89 mg/dL (ref 70–99)
HCT: 45 % (ref 36.0–46.0)
Hemoglobin: 15.3 g/dL — ABNORMAL HIGH (ref 12.0–15.0)
Potassium: 3.2 mmol/L — ABNORMAL LOW (ref 3.5–5.1)
Sodium: 138 mmol/L (ref 135–145)
TCO2: 24 mmol/L (ref 22–32)

## 2023-05-26 LAB — PROCALCITONIN: Procalcitonin: 94.45 ng/mL

## 2023-05-26 LAB — CBG MONITORING, ED
Glucose-Capillary: 111 mg/dL — ABNORMAL HIGH (ref 70–99)
Glucose-Capillary: 75 mg/dL (ref 70–99)

## 2023-05-26 LAB — LACTIC ACID, PLASMA
Lactic Acid, Venous: 4 mmol/L (ref 0.5–1.9)
Lactic Acid, Venous: 3.2 mmol/L (ref 0.5–1.9)
Lactic Acid, Venous: 3 mmol/L (ref 0.5–1.9)
Lactic Acid, Venous: 1.6 mmol/L (ref 0.5–1.9)

## 2023-05-26 LAB — I-STAT CG4 LACTIC ACID, ED
Lactic Acid, Venous: 14.4 mmol/L (ref 0.5–1.9)
Lactic Acid, Venous: 5.1 mmol/L (ref 0.5–1.9)

## 2023-05-26 LAB — MAGNESIUM
Magnesium: 1.6 mg/dL — ABNORMAL LOW (ref 1.7–2.4)
Magnesium: 2.7 mg/dL — ABNORMAL HIGH (ref 1.7–2.4)

## 2023-05-26 LAB — VITAMIN B12: Vitamin B-12: 153 pg/mL — ABNORMAL LOW (ref 180–914)

## 2023-05-26 LAB — MRSA NEXT GEN BY PCR, NASAL: MRSA by PCR Next Gen: NOT DETECTED

## 2023-05-26 LAB — AMMONIA: Ammonia: 19 umol/L (ref 9–35)

## 2023-05-26 LAB — TSH: TSH: 2.184 u[IU]/mL (ref 0.350–4.500)

## 2023-05-26 MED ORDER — METOPROLOL TARTRATE 5 MG/5ML IV SOLN
2.5000 mg | INTRAVENOUS | Status: DC | PRN
  Administered 2023-05-26: 5 mg via INTRAVENOUS
  Filled 2023-05-26 (×2): qty 5

## 2023-05-26 MED ORDER — LACTATED RINGERS IV BOLUS
500.0000 mL | Freq: Once | INTRAVENOUS | Status: AC
  Administered 2023-05-26: 500 mL via INTRAVENOUS

## 2023-05-26 MED ORDER — SODIUM CHLORIDE 3 % IN NEBU
4.0000 mL | INHALATION_SOLUTION | Freq: Two times a day (BID) | RESPIRATORY_TRACT | Status: AC
  Administered 2023-05-26 – 2023-05-28 (×4): 4 mL via RESPIRATORY_TRACT
  Filled 2023-05-26 (×4): qty 4

## 2023-05-26 MED ORDER — INSULIN ASPART 100 UNIT/ML IJ SOLN: 0.0000 [IU] | INTRAMUSCULAR | Status: DC

## 2023-05-26 MED ORDER — FENTANYL 2500MCG IN NS 250ML (10MCG/ML) PREMIX INFUSION
0.0000 ug/h | INTRAVENOUS | Status: DC
  Administered 2023-05-26: 25 ug/h via INTRAVENOUS
  Administered 2023-05-28: 50 ug/h via INTRAVENOUS
  Administered 2023-05-29: 100 ug/h via INTRAVENOUS
  Filled 2023-05-26 (×3): qty 250

## 2023-05-26 MED ORDER — METOPROLOL TARTRATE 5 MG/5ML IV SOLN: 5.0000 mg | INTRAVENOUS | Status: DC | PRN

## 2023-05-26 MED ORDER — NOREPINEPHRINE 4 MG/250ML-% IV SOLN
0.0000 ug/min | INTRAVENOUS | Status: DC
  Administered 2023-05-26: 2 ug/min via INTRAVENOUS
  Administered 2023-05-27: 7 ug/min via INTRAVENOUS
  Administered 2023-05-27: 2 ug/min via INTRAVENOUS
  Filled 2023-05-26 (×3): qty 250

## 2023-05-26 MED ORDER — SUCCINYLCHOLINE CHLORIDE 200 MG/10ML IV SOSY
PREFILLED_SYRINGE | INTRAVENOUS | Status: AC
  Filled 2023-05-26: qty 10

## 2023-05-26 MED ORDER — ACETAMINOPHEN 650 MG RE SUPP
RECTAL | Status: AC
  Administered 2023-05-26: 650 mg via RECTAL
  Filled 2023-05-26: qty 1

## 2023-05-26 MED ORDER — ACETAMINOPHEN 650 MG RE SUPP: 650.0000 mg | Freq: Three times a day (TID) | RECTAL | Status: DC | PRN

## 2023-05-26 MED ORDER — ORAL CARE MOUTH RINSE: 15.0000 mL | OROMUCOSAL | Status: DC | PRN

## 2023-05-26 MED ORDER — CHLORHEXIDINE GLUCONATE CLOTH 2 % EX PADS
6.0000 | MEDICATED_PAD | Freq: Every day | CUTANEOUS | Status: DC
  Administered 2023-05-26 – 2023-06-10 (×16): 6 via TOPICAL

## 2023-05-26 MED ORDER — PROPOFOL 1000 MG/100ML IV EMUL
0.0000 ug/kg/min | INTRAVENOUS | Status: DC
  Administered 2023-05-26: 25 ug/kg/min via INTRAVENOUS
  Administered 2023-05-26: 5 ug/kg/min via INTRAVENOUS
  Administered 2023-05-26: 35 ug/kg/min via INTRAVENOUS
  Administered 2023-05-27 (×3): 25 ug/kg/min via INTRAVENOUS
  Administered 2023-05-28 – 2023-05-29 (×2): 10 ug/kg/min via INTRAVENOUS
  Administered 2023-05-29 – 2023-05-30 (×3): 15 ug/kg/min via INTRAVENOUS
  Filled 2023-05-26 (×13): qty 100

## 2023-05-26 MED ORDER — ETOMIDATE 2 MG/ML IV SOLN
INTRAVENOUS | Status: AC
  Administered 2023-05-26: 20 mg
  Filled 2023-05-26: qty 20

## 2023-05-26 MED ORDER — FENTANYL CITRATE PF 50 MCG/ML IJ SOSY
PREFILLED_SYRINGE | INTRAMUSCULAR | Status: AC
  Administered 2023-05-26: 100 ug
  Filled 2023-05-26: qty 2

## 2023-05-26 MED ORDER — FENTANYL CITRATE PF 50 MCG/ML IJ SOSY
25.0000 ug | PREFILLED_SYRINGE | INTRAMUSCULAR | Status: DC | PRN
  Administered 2023-05-26 (×2): 50 ug via INTRAVENOUS
  Administered 2023-05-27: 25 ug via INTRAVENOUS
  Filled 2023-05-26 (×2): qty 1

## 2023-05-26 MED ORDER — ORAL CARE MOUTH RINSE: 15.0000 mL | OROMUCOSAL | Status: DC

## 2023-05-26 MED ORDER — POLYETHYLENE GLYCOL 3350 17 G PO PACK
17.0000 g | PACK | Freq: Every day | ORAL | Status: DC
  Administered 2023-05-26 – 2023-05-29 (×3): 17 g
  Filled 2023-05-26 (×4): qty 1

## 2023-05-26 MED ORDER — MAGNESIUM SULFATE 4 GM/100ML IV SOLN
4.0000 g | Freq: Once | INTRAVENOUS | Status: AC
Start: 2023-05-26 — End: 2023-05-26
  Administered 2023-05-26: 4 g via INTRAVENOUS
  Filled 2023-05-26: qty 100

## 2023-05-26 MED ORDER — FAMOTIDINE 20 MG PO TABS
20.0000 mg | ORAL_TABLET | Freq: Two times a day (BID) | ORAL | Status: DC
  Administered 2023-05-26 – 2023-06-02 (×16): 20 mg
  Filled 2023-05-26 (×16): qty 1

## 2023-05-26 MED ORDER — SODIUM CHLORIDE 0.9 % IV SOLN
2.0000 g | Freq: Two times a day (BID) | INTRAVENOUS | Status: AC
  Administered 2023-05-26 – 2023-06-07 (×26): 2 g via INTRAVENOUS
  Filled 2023-05-26 (×26): qty 20

## 2023-05-26 MED ORDER — POTASSIUM CHLORIDE 10 MEQ/100ML IV SOLN
10.0000 meq | INTRAVENOUS | Status: AC
  Administered 2023-05-26 (×3): 10 meq via INTRAVENOUS
  Filled 2023-05-26 (×3): qty 100

## 2023-05-26 MED ORDER — CYANOCOBALAMIN 1000 MCG/ML IJ SOLN
1000.0000 ug | Freq: Every day | INTRAMUSCULAR | Status: AC
  Administered 2023-05-26 – 2023-05-28 (×3): 1000 ug via INTRAMUSCULAR
  Filled 2023-05-26 (×3): qty 1

## 2023-05-26 MED ORDER — ACETAMINOPHEN 10 MG/ML IV SOLN
1000.0000 mg | Freq: Once | INTRAVENOUS | Status: DC
  Filled 2023-05-26: qty 100

## 2023-05-26 MED ORDER — DOCUSATE SODIUM 50 MG/5ML PO LIQD: 100.0000 mg | Freq: Two times a day (BID) | ORAL | Status: DC | PRN

## 2023-05-26 MED ORDER — KETAMINE HCL 50 MG/5ML IJ SOSY
PREFILLED_SYRINGE | INTRAMUSCULAR | Status: AC
  Filled 2023-05-26: qty 10

## 2023-05-26 MED ORDER — FENTANYL CITRATE PF 50 MCG/ML IJ SOSY: 25.0000 ug | PREFILLED_SYRINGE | INTRAMUSCULAR | Status: DC | PRN

## 2023-05-26 MED ORDER — MIDAZOLAM HCL 2 MG/2ML IJ SOLN
INTRAMUSCULAR | Status: AC
  Administered 2023-05-26: 2 mg
  Filled 2023-05-26: qty 2

## 2023-05-26 MED ORDER — ORAL CARE MOUTH RINSE
15.0000 mL | OROMUCOSAL | Status: DC
  Administered 2023-05-26 – 2023-06-02 (×89): 15 mL via OROMUCOSAL

## 2023-05-26 MED ORDER — FUROSEMIDE 10 MG/ML IJ SOLN
20.0000 mg | Freq: Once | INTRAMUSCULAR | Status: AC
  Administered 2023-05-26: 20 mg via INTRAVENOUS
  Filled 2023-05-26: qty 2

## 2023-05-26 MED ORDER — POTASSIUM CHLORIDE 20 MEQ PO PACK
40.0000 meq | PACK | ORAL | Status: AC
  Administered 2023-05-26 – 2023-05-27 (×2): 40 meq
  Filled 2023-05-26 (×2): qty 2

## 2023-05-26 MED ORDER — FAMOTIDINE 20 MG PO TABS: 20.0000 mg | ORAL_TABLET | Freq: Two times a day (BID) | ORAL | Status: DC

## 2023-05-26 MED ORDER — ROCURONIUM BROMIDE 10 MG/ML (PF) SYRINGE
PREFILLED_SYRINGE | INTRAVENOUS | Status: AC
  Administered 2023-05-26: 50 mg
  Filled 2023-05-26: qty 10

## 2023-05-26 MED ORDER — DEXTROSE 50 % IV SOLN
INTRAVENOUS | Status: AC
  Filled 2023-05-26: qty 50

## 2023-05-26 NOTE — Procedures (Signed)
 Patient Name: Merilee Wible  MRN: [id number]  Epilepsy Attending: Charlsie Quest  Referring Physician/Provider: Mathews Argyle, NP  Date: 05/26/2023 Duration: 23.20 mins  Patient history: 73 y.o. female with acute encephalopathy of unclear origin. EEG to evaluate for seizure  Level of alertness: comatose,  AEDs during EEG study: LEV, propofol  Technical aspects: This EEG study was done with scalp electrodes positioned according to the 10-20 International system of electrode placement. Electrical activity was reviewed with band pass filter of 1-70Hz , sensitivity of 7 uV/mm, display speed of 41mm/sec with a 60Hz  notched filter applied as appropriate. EEG data were recorded continuously and digitally stored.  Video monitoring was available and reviewed as appropriate.  Description: EEG showed continuous generalized low amplitude 3-5Hz  theta-delta slowing admixed with 12-14hz  beta activity. Hyperventilation and photic stimulation were not performed.      ABNORMALITY - Continuous slow, generalized   IMPRESSION: This study is suggestive of severe diffuse encephalopathy. No seizures or epileptiform discharges were seen throughout the recording.   Ether Wolters Annabelle Harman

## 2023-05-26 NOTE — Progress Notes (Signed)
 RT will hold CPT at this time due to EEG tech at bedside. Will resume at next scheduled time.

## 2023-05-26 NOTE — Progress Notes (Signed)
 Chest x-ray shows ET tube at carina, volume loss on left, decreased breath sounds on left. Pull ET tube back 2 cm. Add chest PT and saline nebs, send respiratory culture  Tiffny Gemmer V. Vassie Loll MD

## 2023-05-26 NOTE — Procedures (Signed)
 Central Venous Catheter Insertion Procedure Note  Morgan Morgan  [id number]  01-02-1951  Date:05/26/23  Time:11:39 AM   Provider Performing:Cathy Ropp R Mareon Robinette   Procedure: Insertion of Non-tunneled Central Venous [id number]) with US guidance ([id number])   Indication(s) Difficult access  Consent Risks of the procedure as well as the alternatives and risks of each were explained to the patient and/or caregiver.  Consent for the procedure was obtained and is signed in the bedside chart  Anesthesia Topical only with 1% lidocaine   Timeout Verified patient identification, verified procedure, site/side was marked, verified correct patient position, special equipment/implants available, medications/allergies/relevant history reviewed, required imaging and test results available.  Sterile Technique Maximal sterile technique including full sterile barrier drape, hand hygiene, sterile gown, sterile gloves, mask, hair covering, sterile ultrasound probe cover (if used).  Procedure Description Area of catheter insertion was cleaned with chlorhexidine and draped in sterile fashion.  With real-time ultrasound guidance a central venous catheter was placed into the left internal jugular vein. Nonpulsatile blood flow and easy flushing noted in all ports.  The catheter was sutured in place and sterile dressing applied.  Complications/Tolerance None; patient tolerated the procedure well. Chest X-ray is ordered to verify placement for internal jugular or subclavian cannulation.   Chest x-ray is not ordered for femoral cannulation.  EBL Minimal  Specimen(s) None  Darcella Gasman Denali Becvar, PA-C

## 2023-05-26 NOTE — Progress Notes (Addendum)
 NEUROLOGY CONSULT FOLLOW UP NOTE   Date of service: May 26, 2023 Patient Name: Morgan Morgan MRN:  [id number] DOB:  1950/04/18  Interval Hx/subjective   Patient worsened overnight, was recently intubated.  She was started on empiric meningeal coverage yesterday.  Cerebellar was hooked up overnight, prelim was without ongoing seizure activity, formal report pending.  Vitals   Vitals:   05/26/23 0635 05/26/23 0636 05/26/23 0640 05/26/23 0700  BP: 125/84 132/81 (!) 156/89 (!) 159/98  Pulse: (!) 139  94 92  Resp: 18 18 18  (!) 22  Temp:      TempSrc:      SpO2: 96%  95% 96%  Weight:      Height:         Body mass index is 36.78 kg/m.  Physical Exam   General: Intubated and sedated  Neurologic Examination    MS: She does open eyes to noxious stimulation but does not follow commands. CN: Pupils are reactive, she has an right exotropia (reported baseline, though does not appear as prominent in her epic photo) Motor: She withdraws to noxious stimulation in all four extremities Sensory: As above  Medications  Current Facility-Administered Medications:    acetaminophen (TYLENOL) suppository 650 mg, 650 mg, Rectal, Q8H PRN, Lovie Macadamia, MD, 650 mg at 05/26/23 0231   acyclovir (ZOVIRAX) 700 mg in dextrose 5 % 100 mL IVPB, 700 mg, Intravenous, Q12H, Arma Heading, RPH, Stopped at 05/25/23 2200   ampicillin (OMNIPEN) 2 g in sodium chloride 0.9 % 100 mL IVPB, 2 g, Intravenous, Q8H, Nooruddin, Saad, MD, Last Rate: 300 mL/hr at 05/26/23 0523, 2 g at 05/26/23 0523   cefTRIAXone (ROCEPHIN) 2 g in sodium chloride 0.9 % 100 mL IVPB, 2 g, Intravenous, Q12H, Gwenevere Abbot, MD, Last Rate: 200 mL/hr at 05/26/23 0759, 2 g at 05/26/23 0759   Chlorhexidine Gluconate Cloth 2 % PADS 6 each, 6 each, Topical, Daily, Dickie La, MD   docusate (COLACE) 50 MG/5ML liquid 100 mg, 100 mg, Per Tube, BID PRN, Oretha Milch, MD   famotidine (PEPCID) tablet 20 mg, 20 mg, Per Tube, BID, Oretha Milch, MD   fentaNYL (SUBLIMAZE) injection 25 mcg, 25 mcg, Intravenous, Q15 min PRN, Oretha Milch, MD   fentaNYL (SUBLIMAZE) injection 25-100 mcg, 25-100 mcg, Intravenous, Q30 min PRN, Oretha Milch, MD   lactated ringers infusion, , Intravenous, Continuous, Arma Heading, RPH, Last Rate: 125 mL/hr at 05/25/23 2058, New Bag at 05/25/23 2058   metoprolol tartrate (LOPRESSOR) injection 2.5-5 mg, 2.5-5 mg, Intravenous, Q3H PRN, Oretha Milch, MD, 5 mg at 05/26/23 1191   Oral care mouth rinse, 15 mL, Mouth Rinse, Q2H, Oretha Milch, MD, 15 mL at 05/26/23 0732   Oral care mouth rinse, 15 mL, Mouth Rinse, PRN, Oretha Milch, MD   polyethylene glycol (MIRALAX / GLYCOLAX) packet 17 g, 17 g, Per Tube, Daily, Oretha Milch, MD   propofol (DIPRIVAN) 1000 MG/100ML infusion, 0-50 mcg/kg/min, Intravenous, Continuous, Cyril Mourning V, MD, Last Rate: 2.65 mL/hr at 05/26/23 0623, 5 mcg/kg/min at 05/26/23 4782   sodium chloride HYPERTONIC 3 % nebulizer solution 4 mL, 4 mL, Nebulization, BID, Oretha Milch, MD   vancomycin (VANCOREADY) IVPB 750 mg/150 mL, 750 mg, Intravenous, Q12H, Arma Heading, RPH, Last Rate: 150 mL/hr at 05/26/23 0418, 750 mg at 05/26/23 0418  Labs and Diagnostic Imaging   Initial lactate was 14.4, down to 3.2 pCO2 at 7 AM was 58.3 with a pH of  7.27   Leukocytosis increased from 15.6->21  Coags are normal  Infectious workup: UA was negative Respiratory panel positive only for rhinovirus COVID-negative CXR with decreased aeration to the left base, indeterminate  Imaging(Personally reviewed): MRI of the brain is negative CT angio without LVO  Assessment   Morgan Morgan is a 73 y.o. female with acute encephalopathy of unclear origin.  Her mental status worsened overnight, and she was intubated.  She has been started on broad-spectrum meningitic coverage and given that she is febrile without source, and densely encephalopathic, I agree with having done this.  She will  need CSF sampling today.  Ceribell report pending, will get formal 24 hours of EEG.  Recommendations  LP for protein, glucose, cells, culture, meningoencephalitis panel Continue empiric antibiotics until infection has been ruled out Overnight EEG Check ammonia, B12, TSH Neurology will continue to follow ______________________________________________________________________  This patient is critically ill and at significant risk of neurological worsening, death and care requires constant monitoring of vital signs, hemodynamics,respiratory and cardiac monitoring, neurological assessment, discussion with family, other specialists and medical decision making of high complexity. I spent 32 minutes of neurocritical care time  in the care of  this patient. This was time spent independent of any time provided by nurse practitioner or PA.  Ritta Slot, MD [facility]   If 7pm- 7am, please page neurology on call as listed in AMION. 05/26/2023  8:16 AM

## 2023-05-26 NOTE — Progress Notes (Signed)
 eLink Physician-Brief Progress Note Patient Name: Morgan Morgan DOB: 09/01/1950 MRN: [id number]   Date of Service  05/26/2023  HPI/Events of Note  Patient with coarse breath sounds and rhonchi, CXR suggests some degree of pulmonary edema.   eICU Interventions  Lasix 20 mg iv x 1 ordered.        Thomasene Lot Neo Yepiz 05/26/2023, 4:15 AM

## 2023-05-26 NOTE — ED Notes (Signed)
 Dr. Rosaura Carpenter at the bedside

## 2023-05-26 NOTE — Procedures (Signed)
 Arterial Catheter Insertion Procedure Note  Morgan Morgan  [id number]  12-Nov-1950  Date:05/26/23  Time:12:02 PM    Provider Performing: Darcella Gasman Urijah Arko    Procedure: Insertion of Arterial Line (81191) with US guidance (47829)   Indication(s) Blood pressure monitoring and/or need for frequent ABGs  Consent Risks of the procedure as well as the alternatives and risks of each were explained to the patient and/or caregiver.  Consent for the procedure was obtained and is signed in the bedside chart  Anesthesia None   Time Out Verified patient identification, verified procedure, site/side was marked, verified correct patient position, special equipment/implants available, medications/allergies/relevant history reviewed, required imaging and test results available.   Sterile Technique Maximal sterile technique including full sterile barrier drape, hand hygiene, sterile gown, sterile gloves, mask, hair covering, sterile ultrasound probe cover (if used).   Procedure Description Area of catheter insertion was cleaned with chlorhexidine and draped in sterile fashion. With real-time ultrasound guidance an arterial catheter was placed into the left  axillary  artery.  Appropriate arterial tracings confirmed on monitor.     Complications/Tolerance None; patient tolerated the procedure well.   EBL Minimal   Specimen(s) None  Darcella Gasman Lillah Standre, PA-C

## 2023-05-26 NOTE — Progress Notes (Signed)
 eLink Physician-Brief Progress Note Patient Name: Morgan Morgan DOB: 07/21/1950 MRN: [id number]   Date of Service  05/26/2023  HPI/Events of Note  Patient with possible transient PEA with spontaneous recovery.    eICU Interventions  Labs sent. PRN Metoprolol held for now.        Thomasene Lot [name] 05/26/2023, 9:10 PM

## 2023-05-26 NOTE — Progress Notes (Signed)
 NAME:  Altha Sweitzer, MRN:  [id number], DOB:  06-16-1950, LOS: 1 ADMISSION DATE:  05/25/2023, CONSULTATION DATE:  05/26/2023  REFERRING MD:  Rockne Coons, CHIEF COMPLAINT: Altered mental status  History of Present Illness:  Patient is unresponsive, history obtained via chart review. 73 year old woman BIBEMS as a code stroke after being found down by her grandson.  Initial neuroexam she was obtunded with disconjugate gaze, grimaces to pain, withdrawing both lower extremities and localizing in uppers.  Head CT and CT angio was negative as well as UDS Initial labs significant for mild leukocytosis and thrombocytopenia with lactate of 3.3 and CK of 672 with hypokalemia.  Respiratory viral panel was positive for rhinovirus. She subsequently developed fever She was empirically treated with ceftriaxone, vancomycin and ampicillin with acyclovir Repeat labs showed lactate rising to 4.2, PCCM consulted due to concern for ability to maintain airway EKG showed atrial fibrillation MRI brain was negative  Pertinent  Medical History  Asthma Hypertension  Significant Hospital Events: Including procedures, antibiotic start and stop dates in addition to other pertinent events   4/9 admitted with AMS, worsened overnight requiring intubation and ICU txfr  Interim History / Subjective:  Moving all extremities spontaneously with sedation wean but not following commands  Febrile up to 103.7  Objective   Blood pressure (!) 159/98, pulse 92, temperature (!) 102.9 F (39.4 C), temperature source Rectal, resp. rate (!) 22, height 5\' 1"  (1.549 m), weight 88.3 kg, SpO2 96%.    Vent Mode: PRVC FiO2 (%):  [100 %] 100 % Set Rate:  [18 bmp] 18 bmp Vt Set:  [380 mL] 380 mL PEEP:  [5 cmH20] 5 cmH20 Plateau Pressure:  [19 cmH20] 19 cmH20   Intake/Output Summary (Last 24 hours) at 05/26/2023 0813 Last data filed at 05/26/2023 6578 Gross per 24 hour  Intake 1524.79 ml  Output 280 ml  Net 1244.79 ml   Filed  Weights   05/25/23 1000 05/26/23 0327  Weight: 99.4 kg 88.3 kg     General:  well nourished, critically ill F intubated and sedated  HEENT: MM pink/moist, ETT in place, sclera anicteric  Neuro: on propofol, with sedation wean will spontaneously move all extremities and withdraw to pain, PERRLA, no gaze deviation  CV: s1s2 irregular tachycardic, no m/r/g PULM:  mildly decreased air entry in the bilateral bases, no rhonchi or wheezing on PRVC GI: soft, bsx4 active  Extremities: warm/dry, no edema  Skin: no rashes or lesions  Labs: Glu 102 Procal 94 Lactic 3.2>3.0 Na 133 Glu 253 Creatinine 1.19 Mag 1.6  Micro: 4/9 Resp pathogen panel +rhinovirus/enterovirus 4/10 Bcx2> 4/9 Covid/flu neg  Abx: Acyclovir 4/9- Ampicillin 4/9 Ceftriaxone 4/9- Vancomycin 4/9-  Tubes/lines/drains  4/9 ETT 4/10 L rad art line, L internal jugular CVC  I/O: 300cc UOP this AM   Imaging: 4/9 CT/CTA head>no acute infarct or LVO 4/10 MRI brain>paranasal sinus thickening   Resolved Hospital Problem list     Assessment & Plan:    Acute encephalopathy Septic shock  Acute hypoxic respiratory failure  Rhinovirus  No acute findings on imaging  Concern for seizure -appreciate neurology following  -continue empiric acyclovir, ampicillin, ceftriaxone and vancomycin -LP pending - Differential includes postictal versus meningitis -she is on empiric meningitis coverage including acyclovir and awaiting LP which is planned for a.m. doubt rhinovirus as the cause -Ceribel EEG with diffuse slowing, no sz activity, LTM considered -follow blood cultures, send UC and tracheal aspirate  -ammonia pending  -additional 1L LR, lactic acid down-trending -borderline  low BP, but likely worsened by sedation, may need norepi   Atrial fibrillation, new onset - Holding of anticoagulation due to planned LP and low CHA2DS2-VASc score - Echo results pending  -Beta-blockade for rate control as  needed  AKI Hypokalemia Hypomagnesemia  -creatinine 1.19, making some urine 300cc since ICU admission -continue to monitoring  -follow I/O  Best Practice (right click and "Reselect all SmartList Selections" daily)   Diet/type: NPO DVT prophylaxis: SCD Pressure ulcer(s): N/A GI prophylaxis: Pepcid Lines: Central line Foley:  N/A Code Status:  full code Last date of multidisciplinary goals of care discussion [4/10, Pt's brother and multiple other family members were updated at the bedside   Labs   CBC: Recent Labs  Lab 05/25/23 1224 05/25/23 2028 05/26/23 0144 05/26/23 0203 05/26/23 0408 05/26/23 0545 05/26/23 0657  WBC 15.6*  --   --  15.9*  --  21.7*  --   NEUTROABS 14.4*  --   --   --   --  19.1*  --   HGB 14.8   < > 9.2*  9.5* 9.6* 15.3* 15.0 15.0  HCT 45.0   < > 27.0*  28.0* 34.0* 45.0 44.1 44.0  MCV 86.2  --   --  99.7  --  83.4  --   PLT 143*  --   --  118*  --  197  --    < > = values in this interval not displayed.    Basic Metabolic Panel: Recent Labs  Lab 05/25/23 1544 05/25/23 1642 05/25/23 2227 05/26/23 0144 05/26/23 0403 05/26/23 0408 05/26/23 0657  NA 140   < > 136 134*  134* 139 138 138  K 3.1*   < > 3.4* 3.8  3.8 3.2* 3.2* 3.6  CL 102  --  100 102 102 103  --   CO2 25  --  21*  --  22  --   --   GLUCOSE 97  --  128* 44* 95 89  --   BUN 11  --  11 6* 13 14  --   CREATININE 1.02*  --  0.93 0.30* 1.07* 1.10*  --   CALCIUM 9.0  --  8.5*  --  8.6*  --   --    < > = values in this interval not displayed.   GFR: Estimated Creatinine Clearance: 46 mL/min (A) (by C-G formula based on SCr of 1.1 mg/dL (H)). Recent Labs  Lab 05/25/23 1224 05/25/23 1642 05/26/23 0144 05/26/23 0203 05/26/23 0400 05/26/23 0545 05/26/23 0546  WBC 15.6*  --   --  15.9*  --  21.7*  --   LATICACIDVEN  --    < > 14.4* 5.1* 4.0*  --  3.2*   < > = values in this interval not displayed.    Liver Function Tests: Recent Labs  Lab 05/25/23 1544  AST 39  ALT  13  ALKPHOS 70  BILITOT 1.2  PROT 7.2  ALBUMIN 2.9*   No results for input(s): "LIPASE", "AMYLASE" in the last 168 hours. No results for input(s): "AMMONIA" in the last 168 hours.  ABG    Component Value Date/Time   PHART 7.273 (L) 05/26/2023 0657   PCO2ART 58.3 (H) 05/26/2023 0657   PO2ART 320 (H) 05/26/2023 0657   HCO3 26.4 05/26/2023 0657   TCO2 28 05/26/2023 0657   ACIDBASEDEF 1.0 05/26/2023 0657   O2SAT 100 05/26/2023 0657     Coagulation Profile: Recent Labs  Lab 05/25/23 1642  INR  1.1    Cardiac Enzymes: Recent Labs  Lab 05/25/23 1642 05/25/23 2227  CKTOTAL 672* 718*    HbA1C: Hgb A1c MFr Bld  Date/Time Value Ref Range Status  07/27/2012 12:13 AM 5.7 (H) <5.7 % Final    Comment:    (NOTE)                                                                       According to the ADA Clinical Practice Recommendations for 2011, when HbA1c is used as a screening test:  >=6.5%   Diagnostic of Diabetes Mellitus           (if abnormal result is confirmed) 5.7-6.4%   Increased risk of developing Diabetes Mellitus References:Diagnosis and Classification of Diabetes Mellitus,Diabetes Care,2011,34(Suppl 1):S62-S69 and Standards of Medical Care in         Diabetes - 2011,Diabetes Care,2011,34 (Suppl 1):S11-S61.    CBG: Recent Labs  Lab 05/25/23 1051 05/26/23 0153 05/26/23 0211 05/26/23 0322 05/26/23 0737  GLUCAP 101* 75 111* 96 95    Review of Systems:   Unable to obtain  Past Medical History:  She,  has a past medical history of Asthma and Hypertension.   Surgical History:   Past Surgical History:  Procedure Laterality Date   ABDOMINAL HYSTERECTOMY       Social History:   reports that she has never smoked. She does not have any smokeless tobacco history on file. She reports that she does not drink alcohol and does not use drugs.   Family History:  Her family history includes Asthma in her brother, mother, and son. There is no history of Colon  cancer or Colon polyps.   Allergies Allergies  Allergen Reactions   Zithromax [Azithromycin] Itching     Home Medications  Prior to Admission medications   Medication Sig Start Date End Date Taking? Authorizing Provider  mometasone-formoterol (DULERA) 100-5 MCG/ACT AERO Inhale 2 puffs into the lungs 2 (two) times daily. 07/27/12  Yes Gosrani, Nimish C, MD  albuterol (VENTOLIN HFA) 108 (90 Base) MCG/ACT inhaler Inhale into the lungs every 6 (six) hours as needed for wheezing or shortness of breath.    [provider]  amLODipine (NORVASC) 5 MG tablet Take 5 mg by mouth daily.    [provider]  D3 SUPER STRENGTH 50 MCG (2000 UT) CAPS Take 1 capsule by mouth daily. 08/03/21   [provider]     CRITICAL CARE Performed by: Darcella Gasman Mateya Torti   Total critical care time: 55 minutes  Critical care time was exclusive of separately billable procedures and treating other patients.  Critical care was necessary to treat or prevent imminent or life-threatening deterioration.  Critical care was time spent personally by me on the following activities: development of treatment plan with patient and/or surrogate as well as nursing, discussions with consultants, evaluation of patient's response to treatment, examination of patient, obtaining history from patient or surrogate, ordering and performing treatments and interventions, ordering and review of laboratory studies, ordering and review of radiographic studies, pulse oximetry and re-evaluation of patient's condition.   Darcella Gasman Roston Grunewald, PA-C [facility] See Amion for pager If no response to pager , please call [phone] until 7pm After  7:00 pm call Elink  [phone]

## 2023-05-26 NOTE — Progress Notes (Signed)
 eLink Physician-Brief Progress Note Patient Name: Keyshla Tunison DOB: [birth year] MRN: [id number]   Date of Service  05/26/2023  HPI/Events of Note  K+ 3.4  eICU Interventions  KCL 40 meq via enteral tube Q 4 hours x 2 ordered.        Thomasene Lot Yaziel Brandon 05/26/2023, 9:50 PM

## 2023-05-26 NOTE — Progress Notes (Signed)
 Echocardiogram 2D Echocardiogram has been performed.  Morgan Morgan 05/26/2023, 8:54 AM

## 2023-05-26 NOTE — Procedures (Signed)
 Lumbar Puncture Procedure Note  Drisana Schweickert  [id number]  1950-07-07  Date:05/26/23  Time:4:00 PM   Provider Performing:Javoni Lucken   Procedure: Lumbar Puncture (86578)  Indication(s) Rule out meningitis  Consent Risks of the procedure as well as the alternatives and risks of each were explained to the patient and/or caregiver.  Consent for the procedure was obtained and is signed in the bedside chart  Anesthesia Topical only with 1% lidocaine    Time Out Verified patient identification, verified procedure, site/side was marked, verified correct patient position, special equipment/implants available, medications/allergies/relevant history reviewed, required imaging and test results available.   Sterile Technique Maximal sterile technique including sterile barrier drape, hand hygiene, sterile gown, sterile gloves, mask, hair covering.    Procedure Description Using palpation, approximate location of L3-L4 space could not be identified.   Lidocaine used to anesthetize skin and subcutaneous tissue overlying this area.  LP was unsuccessful despite multiple attempts. Procedure aborted.  Complications/Tolerance None; patient tolerated the procedure well.   EBL Minimal   Specimen(s)

## 2023-05-26 NOTE — Procedures (Signed)
 Intubation Procedure Note  Morgan Morgan  [id number]  1950-05-01  Date:05/26/23  Time:6:20 AM   Provider Performing:Cyrene Gharibian V. Benjamin Merrihew    Procedure: Intubation (31500)  Indication(s) Respiratory Failure  Consent Risks of the procedure as well as the alternatives and risks of each were explained to the patient and/or caregiver.  Consent for the procedure was obtained and is signed in the bedside chart   Anesthesia Etomidate, Versed, Fentanyl, and Rocuronium   Time Out Verified patient identification, verified procedure, site/side was marked, verified correct patient position, special equipment/implants available, medications/allergies/relevant history reviewed, required imaging and test results available.   Sterile Technique Usual hand hygeine, masks, and gloves were used   Procedure Description Patient positioned in bed supine.  Sedation given as noted above.  Patient was intubated with endotracheal tube using Glidescope.  View was Grade 1 full glottis .  Number of attempts was 1.  Colorimetric CO2 detector was consistent with tracheal placement.   Complications/Tolerance None; patient tolerated the procedure well. Chest X-ray is ordered to verify placement.   EBL Minimal   Specimen(s) None  Nahla Lukin V. Vassie Loll MD

## 2023-05-26 NOTE — Progress Notes (Signed)
 eLink Physician-Brief Progress Note Patient Name: Morgan Morgan DOB: 06/15/1950 MRN: [id number]   Date of Service  05/26/2023  HPI/Events of Note  Patient admitted with high fever, leukocytosis and altered mental status, work up is in progress.  eICU Interventions  New Patient Evaluation.        Thomasene Lot Khadeeja Elden 05/26/2023, 3:22 AM

## 2023-05-26 NOTE — Progress Notes (Signed)
 Ceribell placed on pt per Dr. Derry Lory request.

## 2023-05-26 NOTE — Progress Notes (Addendum)
 Patient evaluated at bedside: messaged regarding critical, Lactic 14.4 Repeated showing lactic 5.1, increased from prior. Glucose 44 on I-stat, cbg in the 70's given half amp of d50. New anemia, hemoglobin 9.5. No clear signed of bleeding. Suspect secondary to RBC clumping. No frank hematuria, no melena or hematochezia. BUN persistently low. No hypotension. Will repeat CBC this am and monitor. Neuro exam relatively unchanged from prior. GCS 7-8. Patient to be transferred to ICU for close monitoring, possible intubation, airway protection.  Rectal tylenol given for fever.  -transfer to ICU for close monitoring of airway -Start Ceribell monitoring per neurology given concern for seizure given rapid rise in lactic acid -monitor serial hgb -monitor CBGs

## 2023-05-26 NOTE — ED Notes (Signed)
 Admitting provider made aware of pts temp.

## 2023-05-26 NOTE — Progress Notes (Signed)
 EEG complete - results pending

## 2023-05-26 NOTE — Procedures (Signed)
 Patient Name: Mari Battaglia  MRN: [id number]  Epilepsy Attending: Charlsie Quest  Referring Physician/Provider: Dr Erick Blinks  Duration: 05/26/2023 0240 to 4098  Patient history: 73 y.o. female with hx of HTN and asthma whi was brought in as an EMS-activated CODE STROKE after being found down this morning by her grandson. Rapid EEG to evaluate for seizure  Level of alertness:  comatose/ lethargic   AEDs during EEG study: LEV, propofol  Technical aspects: This EEG was obtained using a 10 lead EEG system positioned circumferentially without any parasagittal coverage (rapid EEG). Computer selected EEG is reviewed as  well as background features and all clinically significant events.  Description: EEG showed continuous generalized low amplitude 3-5Hz  theta-delta slowing admixed with 12-14hz  beta activity. Hyperventilation and photic stimulation were not performed.     ABNORMALITY - Continuous slow, generalized  IMPRESSION: This limited ceribell EEG is suggestive of severe diffuse encephalopathy. No seizures or epileptiform discharges were seen throughout the recording.  If suspicion for interictal activity remains a concern, a conventional full montage EEG can be considered.   Rosalene Wardrop Annabelle Harman

## 2023-05-27 ENCOUNTER — Inpatient Hospital Stay (HOSPITAL_COMMUNITY)

## 2023-05-27 DIAGNOSIS — R6521 Severe sepsis with septic shock: Secondary | ICD-10-CM | POA: Diagnosis not present

## 2023-05-27 DIAGNOSIS — A419 Sepsis, unspecified organism: Secondary | ICD-10-CM | POA: Diagnosis not present

## 2023-05-27 DIAGNOSIS — R509 Fever, unspecified: Secondary | ICD-10-CM | POA: Diagnosis not present

## 2023-05-27 DIAGNOSIS — J9601 Acute respiratory failure with hypoxia: Secondary | ICD-10-CM | POA: Diagnosis not present

## 2023-05-27 DIAGNOSIS — I5020 Unspecified systolic (congestive) heart failure: Secondary | ICD-10-CM

## 2023-05-27 DIAGNOSIS — I4891 Unspecified atrial fibrillation: Secondary | ICD-10-CM | POA: Diagnosis not present

## 2023-05-27 DIAGNOSIS — G9340 Encephalopathy, unspecified: Secondary | ICD-10-CM | POA: Diagnosis not present

## 2023-05-27 LAB — CBC
HCT: 37.4 % (ref 36.0–46.0)
Hemoglobin: 12.4 g/dL (ref 12.0–15.0)
MCH: 28.2 pg (ref 26.0–34.0)
MCHC: 33.2 g/dL (ref 30.0–36.0)
MCV: 85.2 fL (ref 80.0–100.0)
Platelets: 152 10*3/uL (ref 150–400)
RBC: 4.39 MIL/uL (ref 3.87–5.11)
RDW: 16.6 % — ABNORMAL HIGH (ref 11.5–15.5)
WBC: 24.4 10*3/uL — ABNORMAL HIGH (ref 4.0–10.5)
nRBC: 0 % (ref 0.0–0.2)

## 2023-05-27 LAB — COMPREHENSIVE METABOLIC PANEL WITH GFR
ALT: 15 U/L (ref 0–44)
AST: 30 U/L (ref 15–41)
Albumin: 2 g/dL — ABNORMAL LOW (ref 3.5–5.0)
Alkaline Phosphatase: 70 U/L (ref 38–126)
Anion gap: 8 (ref 5–15)
BUN: 24 mg/dL — ABNORMAL HIGH (ref 8–23)
CO2: 23 mmol/L (ref 22–32)
Calcium: 7.7 mg/dL — ABNORMAL LOW (ref 8.9–10.3)
Chloride: 107 mmol/L (ref 98–111)
Creatinine, Ser: 1.06 mg/dL — ABNORMAL HIGH (ref 0.44–1.00)
GFR, Estimated: 55 mL/min — ABNORMAL LOW (ref >60)
Glucose, Bld: 99 mg/dL (ref 70–99)
Potassium: 4.8 mmol/L (ref 3.5–5.1)
Sodium: 138 mmol/L (ref 135–145)
Total Bilirubin: 0.5 mg/dL (ref 0.0–1.2)
Total Protein: 5.7 g/dL — ABNORMAL LOW (ref 6.5–8.1)

## 2023-05-27 LAB — POCT I-STAT 7, (LYTES, BLD GAS, ICA,H+H)
Acid-base deficit: 1 mmol/L (ref 0.0–2.0)
Bicarbonate: 24.4 mmol/L (ref 20.0–28.0)
Calcium, Ion: 1.15 mmol/L (ref 1.15–1.40)
HCT: 39 % (ref 36.0–46.0)
Hemoglobin: 13.3 g/dL (ref 12.0–15.0)
O2 Saturation: 99 %
Potassium: 4.8 mmol/L (ref 3.5–5.1)
Sodium: 137 mmol/L (ref 135–145)
TCO2: 26 mmol/L (ref 22–32)
pCO2 arterial: 40.9 mmHg (ref 32–48)
pH, Arterial: 7.384 (ref 7.35–7.45)
pO2, Arterial: 125 mmHg — ABNORMAL HIGH (ref 83–108)

## 2023-05-27 LAB — GLUCOSE, CAPILLARY
Glucose-Capillary: 93 mg/dL (ref 70–99)
Glucose-Capillary: 81 mg/dL (ref 70–99)
Glucose-Capillary: 105 mg/dL — ABNORMAL HIGH (ref 70–99)
Glucose-Capillary: 108 mg/dL — ABNORMAL HIGH (ref 70–99)
Glucose-Capillary: 100 mg/dL — ABNORMAL HIGH (ref 70–99)
Glucose-Capillary: 139 mg/dL — ABNORMAL HIGH (ref 70–99)

## 2023-05-27 LAB — COOXEMETRY PANEL
Carboxyhemoglobin: 1.1 % (ref 0.5–1.5)
Methemoglobin: 0.8 % (ref 0.0–1.5)
O2 Saturation: 79.4 %
Total hemoglobin: 12.2 g/dL (ref 12.0–16.0)

## 2023-05-27 LAB — PHOSPHORUS: Phosphorus: 3.9 mg/dL (ref 2.5–4.6)

## 2023-05-27 LAB — TROPONIN I (HIGH SENSITIVITY)
Troponin I (High Sensitivity): 92 ng/L — ABNORMAL HIGH (ref <18)
Troponin I (High Sensitivity): 81 ng/L — ABNORMAL HIGH (ref <18)

## 2023-05-27 LAB — BRAIN NATRIURETIC PEPTIDE: B Natriuretic Peptide: 420.9 pg/mL — ABNORMAL HIGH (ref 0.0–100.0)

## 2023-05-27 LAB — TRIGLYCERIDES: Triglycerides: 87 mg/dL (ref <150)

## 2023-05-27 LAB — MAGNESIUM: Magnesium: 2.6 mg/dL — ABNORMAL HIGH (ref 1.7–2.4)

## 2023-05-27 LAB — VANCOMYCIN, TROUGH: Vancomycin Tr: 19 ug/mL (ref 15–20)

## 2023-05-27 MED ORDER — DEXTROSE 50 % IV SOLN: 1.0000 | Freq: Once | INTRAVENOUS | Status: AC

## 2023-05-27 MED ORDER — PROSOURCE TF20 ENFIT COMPATIBL EN LIQD
60.0000 mL | Freq: Two times a day (BID) | ENTERAL | Status: DC
  Administered 2023-05-27 – 2023-06-03 (×14): 60 mL
  Filled 2023-05-27 (×15): qty 60

## 2023-05-27 MED ORDER — GADOBUTROL 1 MMOL/ML IV SOLN
9.0000 mL | Freq: Once | INTRAVENOUS | Status: AC | PRN
  Administered 2023-05-27: 9 mL via INTRAVENOUS

## 2023-05-27 MED ORDER — PROSOURCE TF20 ENFIT COMPATIBL EN LIQD: 60.0000 mL | Freq: Every day | ENTERAL | Status: DC

## 2023-05-27 MED ORDER — FENTANYL BOLUS VIA INFUSION
25.0000 ug | INTRAVENOUS | Status: DC | PRN
  Administered 2023-05-27: 25 ug via INTRAVENOUS
  Administered 2023-05-27: 50 ug via INTRAVENOUS
  Administered 2023-05-27: 100 ug via INTRAVENOUS

## 2023-05-27 MED ORDER — HEPARIN (PORCINE) 25000 UT/250ML-% IV SOLN
1200.0000 [IU]/h | INTRAVENOUS | Status: DC
  Administered 2023-05-27: 900 [IU]/h via INTRAVENOUS
  Filled 2023-05-27 (×2): qty 250

## 2023-05-27 MED ORDER — THIAMINE HCL 100 MG/ML IJ SOLN
100.0000 mg | Freq: Every day | INTRAMUSCULAR | Status: DC
  Administered 2023-05-27 – 2023-06-04 (×9): 100 mg via INTRAVENOUS
  Filled 2023-05-27 (×9): qty 2

## 2023-05-27 MED ORDER — INSULIN ASPART 100 UNIT/ML IJ SOLN
0.0000 [IU] | INTRAMUSCULAR | Status: DC
  Administered 2023-05-27 – 2023-06-03 (×14): 1 [IU] via SUBCUTANEOUS
  Administered 2023-06-05: 3 [IU] via SUBCUTANEOUS

## 2023-05-27 MED ORDER — LIDOCAINE 1 % OPTIME INJ - NO CHARGE
5.0000 mL | Freq: Once | INTRAMUSCULAR | Status: AC
  Administered 2023-05-27: 5 mL via INTRADERMAL
  Filled 2023-05-27: qty 6

## 2023-05-27 MED ORDER — VANCOMYCIN HCL 750 MG/150ML IV SOLN
750.0000 mg | Freq: Two times a day (BID) | INTRAVENOUS | Status: AC
  Administered 2023-05-27 – 2023-06-07 (×23): 750 mg via INTRAVENOUS
  Filled 2023-05-27 (×26): qty 150

## 2023-05-27 MED ORDER — DEXTROSE 50 % IV SOLN
INTRAVENOUS | Status: AC
  Administered 2023-05-27: 50 mL via INTRAVENOUS
  Filled 2023-05-27: qty 50

## 2023-05-27 MED ORDER — VITAL 1.5 CAL PO LIQD
1000.0000 mL | ORAL | Status: DC
  Administered 2023-05-27 – 2023-06-03 (×5): 1000 mL
  Filled 2023-05-27 (×2): qty 1000

## 2023-05-27 MED ORDER — ARFORMOTEROL TARTRATE 15 MCG/2ML IN NEBU
15.0000 ug | INHALATION_SOLUTION | Freq: Two times a day (BID) | RESPIRATORY_TRACT | Status: DC
  Administered 2023-05-27 – 2023-06-10 (×26): 15 ug via RESPIRATORY_TRACT
  Filled 2023-05-27 (×29): qty 2

## 2023-05-27 MED ORDER — VITAL HIGH PROTEIN PO LIQD: 1000.0000 mL | ORAL | Status: DC

## 2023-05-27 MED ORDER — FUROSEMIDE 10 MG/ML IJ SOLN
80.0000 mg | Freq: Once | INTRAMUSCULAR | Status: AC
  Administered 2023-05-27: 80 mg via INTRAVENOUS
  Filled 2023-05-27: qty 8

## 2023-05-27 MED ORDER — BUDESONIDE 0.25 MG/2ML IN SUSP
0.2500 mg | Freq: Two times a day (BID) | RESPIRATORY_TRACT | Status: DC
  Administered 2023-05-27 – 2023-06-10 (×26): 0.25 mg via RESPIRATORY_TRACT
  Filled 2023-05-27 (×29): qty 2

## 2023-05-27 MED ORDER — SODIUM CHLORIDE 0.9 % IV SOLN
2.0000 g | Freq: Four times a day (QID) | INTRAVENOUS | Status: DC
  Administered 2023-05-27 – 2023-05-28 (×3): 2 g via INTRAVENOUS
  Filled 2023-05-27 (×5): qty 2000

## 2023-05-27 NOTE — Progress Notes (Signed)
 Pharmacy Antibiotic Note  Morgan Morgan is a 73 y.o. female admitted on 05/25/2023 with acute encephalopathy and concern for meningitis.  Pharmacy has been consulted for acyclovir dosing. Patient received vancomycin, ceftriaxone, and ampicillin in the ED on 05/25/23.   WBC 24, afebrile. Scr 1.06 (CrCl 49 mL/min). Vancomycin trough prior to 4th dose came back at 19.   Plan: Continue vancomycin 750 mg IV every 12 hours Ampicillin per MD Ceftriaxone per MD Acyclovir 700 mg IV every 12 hours LR 125 mL/hr while on acyclovir Monitor renal function, cultures, and clinical signs of improvement Monitor vancomycin levels and de-escalate antibiotics as appropriate  Height: 5\' 1"  (154.9 cm) Weight: 92.3 kg (203 lb 7.8 oz) IBW/kg (Calculated) : 47.8  Temp (24hrs), Avg:98.2 F (36.8 C), Min:97.7 F (36.5 C), Max:98.4 F (36.9 C)  Recent Labs  Lab 05/25/23 1224 05/25/23 1544 05/26/23 0203 05/26/23 0400 05/26/23 0403 05/26/23 0408 05/26/23 0545 05/26/23 0546 05/26/23 1006 05/26/23 1525 05/26/23 2045 05/27/23 0409 05/27/23 1530  WBC 15.6*  --  15.9*  --   --   --  21.7*  --  23.1*  --   --  24.4*  --   CREATININE  --    < >  --   --  1.07* 1.10*  --   --  1.19*  --  1.05* 1.06*  --   LATICACIDVEN  --    < > 5.1* 4.0*  --   --   --  3.2* 3.0* 1.6  --   --   --   VANCOTROUGH  --   --   --   --   --   --   --   --   --   --   --   --  19   < > = values in this interval not displayed.    Estimated Creatinine Clearance: 49 mL/min (A) (by C-G formula based on SCr of 1.06 mg/dL (H)).    Allergies  Allergen Reactions   Zithromax [Azithromycin] Itching    Antimicrobials this admission: Ampicillin 4/9 x1 Ceftriaxone 4/9 x1 Vancomycin 4/9 x1 Acyclovir 4/9 >>  Microbiology results: 4/9 RVP:  Thank you for allowing pharmacy to participate in this patient's care,  Sherron Monday, PharmD, BCCCP Clinical Pharmacist  Phone: [phone] 05/27/2023 5:25 PM  Please check AMION for all [facility]  Pharmacy phone numbers After 10:00 PM, call Main Pharmacy [phone]

## 2023-05-27 NOTE — Plan of Care (Signed)
  Problem: Clinical Measurements: Goal: Diagnostic test results will improve Outcome: Progressing Goal: Respiratory complications will improve Outcome: Progressing   Problem: Elimination: Goal: Will not experience complications related to urinary retention Outcome: Progressing   Problem: Skin Integrity: Goal: Risk for impaired skin integrity will decrease Outcome: Progressing   Problem: Respiratory: Goal: Ability to maintain a clear airway and adequate ventilation will improve Outcome: Progressing   Problem: Metabolic: Goal: Ability to maintain appropriate glucose levels will improve Outcome: Progressing   Problem: Tissue Perfusion: Goal: Adequacy of tissue perfusion will improve Outcome: Progressing   Problem: Activity: Goal: Risk for activity intolerance will decrease Outcome: Not Progressing   Problem: Nutrition: Goal: Adequate nutrition will be maintained Outcome: Not Progressing   Problem: Elimination: Goal: Will not experience complications related to bowel motility Outcome: Not Progressing

## 2023-05-27 NOTE — Progress Notes (Signed)
 RT assisted with transport of this pt from 2H to MRI and back while on full ventilatory support. Pt tolerated well with SVS and no complications.

## 2023-05-27 NOTE — Progress Notes (Addendum)
 NEUROLOGY CONSULT FOLLOW UP NOTE   Date of service: May 27, 2023 Patient Name: Morgan Morgan MRN:  [id number] DOB:  01/29/1951  Interval Hx/subjective   Patient continues to be encephalopathic  Vitals   Vitals:   05/27/23 1145 05/27/23 1200 05/27/23 1215 05/27/23 1230  BP:      Pulse: 79 71 85 85  Resp: (!) 30 (!) 25 (!) 32 (!) 34  Temp:      TempSrc:      SpO2: 96% 95% 96% 96%  Weight:      Height:         Body mass index is 38.45 kg/m.  Physical Exam   General: Intubated and sedated Her neck does feel stiff  Neurologic Examination    MS: Does not open eyes or follow commands CN: Pupils are reactive, she has an right exotropia (reported baseline, though does not appear as prominent in her epic photo) Motor: Withdraws no stimulation bilaterally Sensory: As above  Medications  Current Facility-Administered Medications:    acetaminophen (TYLENOL) suppository 650 mg, 650 mg, Rectal, Q8H PRN, Lovie Macadamia, MD, 650 mg at 05/26/23 0231   acyclovir (ZOVIRAX) 700 mg in dextrose 5 % 100 mL IVPB, 700 mg, Intravenous, Q12H, Arma Heading, RPH, Stopped at 05/27/23 1023   ampicillin (OMNIPEN) 2 g in sodium chloride 0.9 % 100 mL IVPB, 2 g, Intravenous, Q6H, Klus, Jesse L, RPH, Last Rate: 300 mL/hr at 05/27/23 1400, 2 g at 05/27/23 1400   cefTRIAXone (ROCEPHIN) 2 g in sodium chloride 0.9 % 100 mL IVPB, 2 g, Intravenous, Q12H, Gwenevere Abbot, MD, Stopped at 05/27/23 0849   Chlorhexidine Gluconate Cloth 2 % PADS 6 each, 6 each, Topical, Daily, Dickie La, MD, 6 each at 05/27/23 1028   cyanocobalamin (VITAMIN B12) injection 1,000 mcg, 1,000 mcg, Intramuscular, Daily, Rejeana Brock, MD, 1,000 mcg at 05/27/23 1017   docusate (COLACE) 50 MG/5ML liquid 100 mg, 100 mg, Per Tube, BID PRN, Oretha Milch, MD   famotidine (PEPCID) tablet 20 mg, 20 mg, Per Tube, BID, Oretha Milch, MD, 20 mg at 05/27/23 1017   feeding supplement (PROSource TF20) liquid 60 mL, 60 mL, Per  Tube, BID, Karie Fetch P, DO   feeding supplement (VITAL 1.5 CAL) liquid 1,000 mL, 1,000 mL, Per Tube, Continuous, Steffanie Dunn, DO, Last Rate: 45 mL/hr at 05/27/23 1410, 1,000 mL at 05/27/23 1410   fentaNYL (SUBLIMAZE) bolus via infusion 25-100 mcg, 25-100 mcg, Intravenous, Q30 min PRN, Mosetta Anis, RPH, 50 mcg at 05/27/23 1224   fentaNYL in NS (59mcg/ml) infusion-PREMIX, 0-400 mcg/hr, Intravenous, Continuous, Gleason, Darcella Gasman, PA-C, Last Rate: 2.5 mL/hr at 05/27/23 1200, 25 mcg/hr at 05/27/23 1200   insulin aspart (novoLOG) injection 0-9 Units, 0-9 Units, Subcutaneous, Q4H, Karie Fetch P, DO   lactated ringers infusion, , Intravenous, Continuous, Gleason, Darcella Gasman, PA-C, Last Rate: 50 mL/hr at 05/27/23 1217, New Bag at 05/27/23 1217   metoprolol tartrate (LOPRESSOR) injection 2.5-5 mg, 2.5-5 mg, Intravenous, Q3H PRN, Oretha Milch, MD, 5 mg at 05/26/23 7829   norepinephrine (LEVOPHED) 4mg  in (0.016 mg/mL) premix infusion, 0-40 mcg/min, Intravenous, Titrated, Gleason, Darcella Gasman, PA-C, Last Rate: 11.25 mL/hr at 05/27/23 1200, 3 mcg/min at 05/27/23 1200   Oral care mouth rinse, 15 mL, Mouth Rinse, Q2H, Cyril Mourning V, MD, 15 mL at 05/27/23 1219   Oral care mouth rinse, 15 mL, Mouth Rinse, PRN, Oretha Milch, MD   polyethylene glycol (MIRALAX / GLYCOLAX) packet  17 g, 17 g, Per Tube, Daily, Oretha Milch, MD, 17 g at 05/27/23 1017   propofol (DIPRIVAN) 1000 MG/100ML infusion, 0-50 mcg/kg/min, Intravenous, Continuous, Oretha Milch, MD, Last Rate: 2.65 mL/hr at 05/27/23 1200, 5 mcg/kg/min at 05/27/23 1200   sodium chloride HYPERTONIC 3 % nebulizer solution 4 mL, 4 mL, Nebulization, BID, Oretha Milch, MD, 4 mL at 05/27/23 0810   thiamine (VITAMIN B1) injection 100 mg, 100 mg, Intravenous, Daily, Gleason, Darcella Gasman, PA-C, 100 mg at 05/27/23 1408   vancomycin (VANCOREADY) IVPB 750 mg/150 mL, 750 mg, Intravenous, Q12H, Arma Heading, RPH, Stopped at 05/27/23 0515  Labs  and Diagnostic Imaging   Initial lactate was 14.4, down to 3.2 pCO2 at 7 AM was 58.3 with a pH of 7.27   Leukocytosis increased from 15.6->21  Coags are normal  Infectious workup: UA was negative Respiratory panel positive only for rhinovirus COVID-negative CXR with decreased aeration to the left base, indeterminate  Imaging(Personally reviewed): MRI of the brain is negative CT angio without LVO  Assessment   Morgan Morgan is a 73 y.o. female with acute encephalopathy of unclear origin.  Her mental status worsened overnight, and she was intubated.  She has been started on broad-spectrum meningitic coverage and given that she is febrile without source, and densely encephalopathic, I agree with having done this.    She does feel like she has some degree of meningismus, so I do continue to have high concern for CNS infection.  No evidence of ongoing seizure activity.  Her procalcitonin is extremely elevated, and I would favor continued bacterial coverage until we can rule out CNS infection.  They attempted to do lumbar puncture under fluoroscopy today, but reported dry tap.  I think would be worthwhile to do an MRI of her lumbar spine to see if there is fat deposits, or other reason why we would not expect to get fluid to determine if we should try again.  Her B12 is low, though I do not think it was the primary etiology of her presentation, I certainly think it should be repleted and therefore I have started her on B12 shots.  Recommendations  LP for protein, glucose, cells, culture, meningoencephalitis panel Continue empiric antibiotics until infection has been ruled out Neurology will continue to follow ______________________________________________________________________  This patient is critically ill and at significant risk of neurological worsening, death and care requires constant monitoring of vital signs, hemodynamics,respiratory and cardiac monitoring, neurological  assessment, discussion with family, other specialists and medical decision making of high complexity. I spent 38 minutes of neurocritical care time  in the care of  this patient. This was time spent independent of any time provided by nurse practitioner or PA.  Ritta Slot, MD [facility]   If 7pm- 7am, please page neurology on call as listed in AMION. 05/27/2023  2:40 PM

## 2023-05-27 NOTE — Plan of Care (Addendum)
   Problem: Education: Goal: Knowledge of General Education information will improve Description: Including pain rating scale, medication(s)/side effects and non-pharmacologic comfort measures Outcome: Progressing   Problem: Health Behavior/Discharge Planning: Goal: Ability to manage health-related needs will improve Outcome: Progressing   Problem: Clinical Measurements: Goal: Ability to maintain clinical measurements within normal limits will improve Outcome: Progressing Goal: Will remain free from infection Outcome: Progressing Goal: Diagnostic test results will improve Outcome: Progressing Goal: Respiratory complications will improve Outcome: Progressing Goal: Cardiovascular complication will be avoided Outcome: Progressing   Problem: Activity: Goal: Risk for activity intolerance will decrease Outcome: Progressing   Problem: Nutrition: Goal: Adequate nutrition will be maintained Outcome: Progressing   Problem: Coping: Goal: Level of anxiety will decrease Outcome: Progressing   Problem: Elimination: Goal: Will not experience complications related to bowel motility Outcome: Progressing Goal: Will not experience complications related to urinary retention Outcome: Progressing   Problem: Pain Managment: Goal: General experience of comfort will improve and/or be controlled Outcome: Progressing   Problem: Safety: Goal: Ability to remain free from injury will improve Outcome: Progressing   Problem: Skin Integrity: Goal: Risk for impaired skin integrity will decrease Outcome: Progressing   Problem: Activity: Goal: Ability to tolerate increased activity will improve Outcome: Progressing   Problem: Respiratory: Goal: Ability to maintain a clear airway and adequate ventilation will improve Outcome: Progressing   Problem: Role Relationship: Goal: Method of communication will improve Outcome: Progressing   Problem: Education: Goal: Ability to describe self-care  measures that may prevent or decrease complications (Diabetes Survival Skills Education) will improve Outcome: Progressing Goal: Individualized Educational Video(s) Outcome: Progressing   Problem: Coping: Goal: Ability to adjust to condition or change in health will improve Outcome: Progressing   Problem: Fluid Volume: Goal: Ability to maintain a balanced intake and output will improve Outcome: Progressing   Problem: Health Behavior/Discharge Planning: Goal: Ability to identify and utilize available resources and services will improve Outcome: Progressing Goal: Ability to manage health-related needs will improve Outcome: Progressing   Problem: Metabolic: Goal: Ability to maintain appropriate glucose levels will improve Outcome: Progressing   Problem: Nutritional: Goal: Maintenance of adequate nutrition will improve Outcome: Progressing Goal: Progress toward achieving an optimal weight will improve Outcome: Progressing   Problem: Skin Integrity: Goal: Risk for impaired skin integrity will decrease Outcome: Progressing   Problem: Tissue Perfusion: Goal: Adequacy of tissue perfusion will improve Outcome: Progressing

## 2023-05-27 NOTE — Procedures (Signed)
 PROCEDURE SUMMARY:  Technically successful fluoroscopic guided lumbar puncture at the level of L5-S1 and L3-4.  Fluid was visible in the hub cap at both levels, but would not advance out of the hub.   Unfortunately no fluid was able to be collected.  No immediate complications.  Pt tolerated well.   EBL = none  Please see full dictation in imaging section of Epic for procedure details.   Electronically Signed: Jama Flavors, PA-C 05/27/2023, 2:44 PM

## 2023-05-27 NOTE — Progress Notes (Signed)
 RT assisted with transport of this pt from 2H to flouro and back while on full ventilatory support. Pt tolerated well with SVS.

## 2023-05-27 NOTE — Progress Notes (Signed)
 eLink Physician-Brief Progress Note Patient Name: Morgan Morgan DOB: Mar 18, 1950 MRN: [id number]   Date of Service  05/27/2023  HPI/Events of Note  MRI of lumbar spine suspicious for early osteomyelitis / diskitis, patient is on broad empiric coverage.  eICU Interventions  Infectious disease consult requested. I spoke with ID consultant on call who will see the patient in the morning.        Morgan Morgan 05/27/2023, 8:08 PM

## 2023-05-27 NOTE — Progress Notes (Signed)
 NAME:  Morgan Morgan, MRN:  [id number], DOB:  1950-03-22, LOS: 2 ADMISSION DATE:  05/25/2023, CONSULTATION DATE:  05/27/2023  REFERRING MD:  Rockne Coons, CHIEF COMPLAINT: Altered mental status  History of Present Illness:  Patient is unresponsive, history obtained via chart review. 73 year old woman BIBEMS as a code stroke after being found down by her grandson.  Initial neuroexam she was obtunded with disconjugate gaze, grimaces to pain, withdrawing both lower extremities and localizing in uppers.  Head CT and CT angio was negative as well as UDS Initial labs significant for mild leukocytosis and thrombocytopenia with lactate of 3.3 and CK of 672 with hypokalemia.  Respiratory viral panel was positive for rhinovirus. She subsequently developed fever She was empirically treated with ceftriaxone, vancomycin and ampicillin with acyclovir Repeat labs showed lactate rising to 4.2, PCCM consulted due to concern for ability to maintain airway EKG showed atrial fibrillation MRI brain was negative  Pertinent  Medical History  Asthma Hypertension  Significant Hospital Events: Including procedures, antibiotic start and stop dates in addition to other pertinent events   4/9 admitted with AMS, worsened overnight requiring intubation and ICU txfr 4/11 remains intubated with mental status precluding extubation, plan for LP with IR today  Interim History / Subjective:  Possible episode of PEA overnight, no chest compressions Neuro status unchanged this AM with sedation wean, grimacing and moving all extremities without following commands Remains on Levo EF 20-25%, SVO2 79% EEG negative for sz activity  Objective   Blood pressure 113/61, pulse 84, temperature 98.4 F (36.9 C), temperature source Rectal, resp. rate (!) 21, height 5\' 1"  (1.549 m), weight 92.3 kg, SpO2 97%.    Vent Mode: PRVC FiO2 (%):  [40 %-70 %] 40 % Set Rate:  [20 bmp] 20 bmp Vt Set:  [380 mL] 380 mL PEEP:  [5 cmH20] 5  cmH20 Plateau Pressure:  [13 cmH20-18 cmH20] 14 cmH20   Intake/Output Summary (Last 24 hours) at 05/27/2023 0914 Last data filed at 05/27/2023 4098 Gross per 24 hour  Intake 3331.66 ml  Output 685 ml  Net 2646.66 ml   Filed Weights   05/25/23 1000 05/26/23 0327 05/27/23 0515  Weight: 99.4 kg 88.3 kg 92.3 kg     General:  well nourished, critically ill F intubated and sedated  HEENT: MM pink/moist, ETT in place, sclera anicteric  Neuro: restless and withdrawing to pain with sedation wean, PERRLA, not following commands  CV: s1s2 irregular tachycardic, no m/r/g PULM:  mildly decreased air entry in the bilateral bases, no rhonchi or wheezing on PRVC GI: soft, bsx4 active  Extremities: warm/dry, no edema  Skin: no rashes or lesions  Labs: Glu 81 Procal 94 Lactic 3.2>3.0>1.6 Na 138 Creatinine 1.06 Mag 2.6 Coox 79.4% BNP 420 Trop 92   Micro: 4/9 Resp pathogen panel +rhinovirus/enterovirus 4/10 Bcx2> 4/9 Covid/flu neg  Abx: Acyclovir 4/9- Ampicillin 4/9 Ceftriaxone 4/9- Vancomycin 4/9-  Tubes/lines/drains  4/9 ETT 4/10 L rad art line, L internal jugular CVC 4/11 foley  I/O: 735cc UOP yesterday Net positive 3.8L since admission   Imaging: 4/9 CT/CTA head>no acute infarct or LVO 4/10 MRI brain>paranasal sinus thickening   Resolved Hospital Problem list     Assessment & Plan:    Acute encephalopathy Septic shock  Acute hypoxic respiratory failure  Rhinovirus  No acute findings on imaging  No seizure activity -appreciate neurology following  -continue empiric acyclovir, ampicillin, ceftriaxone and vancomycin -LP with IR pending today - most likely current diagnosis is viral meningitis  -  follow blood cultures, send UC and tracheal aspirate  -ammonia WNL -requiring low dose norepi to maintain MAP >65 -start Thiamine 100mg  qd -fentanyl and propofol for PAD protocol  --Maintain full vent support with SAT/SBT as tolerated -titrate Vent setting to  maintain SpO2 greater than or equal to 90%. -HOB elevated 30 degrees. -Plateau pressures less than 30 cm H20.  -Follow chest x-ray, ABG prn.   -Bronchial hygiene and RT/bronchodilator protocol.    Atrial fibrillation, new onset HFrEF, EF 20-25% Family reports that she has had a "mild heart attack" in the past In SR this AM - Holding of anticoagulation due to planned LP and low CHA2DS2-VASc score - Echo with severely reduced EF and global hypokinesis, no focal wall abnormalities or vegetations, no RV dysfunction -Beta-blockade for rate control as needed -SVO2 does not suggest cardiogenic shock, CVP 10, Trop 94, BNP 420  AKI Hypokalemia Hypomagnesemia  -creatinine slightly rising, still making urine -continue to monitoring  -follow I/O -replace electrolytes prn   Best Practice (right click and "Reselect all SmartList Selections" daily)   Diet/type: NPO DVT prophylaxis: SCD Pressure ulcer(s): N/A GI prophylaxis: Pepcid Lines: Central line Foley:  yes and still needed  Code Status:  full code Last date of multidisciplinary goals of care discussion [4/11 family updated at the bedside]  Labs   CBC: Recent Labs  Lab 05/25/23 1224 05/25/23 2028 05/26/23 0203 05/26/23 0408 05/26/23 0545 05/26/23 0657 05/26/23 0900 05/26/23 1006 05/27/23 0409 05/27/23 0412  WBC 15.6*  --  15.9*  --  21.7*  --   --  23.1* 24.4*  --   NEUTROABS 14.4*  --   --   --  19.1*  --   --   --   --   --   HGB 14.8   < > 9.6*   < > 15.0 15.0 15.0 14.8 12.4 13.3  HCT 45.0   < > 34.0*   < > 44.1 44.0 44.0 44.4 37.4 39.0  MCV 86.2  --  99.7  --  83.4  --   --  84.4 85.2  --   PLT 143*  --  118*  --  197  --   --  178 152  --    < > = values in this interval not displayed.    Basic Metabolic Panel: Recent Labs  Lab 05/25/23 2227 05/26/23 0144 05/26/23 0403 05/26/23 0408 05/26/23 0657 05/26/23 0900 05/26/23 1006 05/26/23 2045 05/27/23 0409 05/27/23 0412  NA 136   < > 139 138   < > 139  133* 138 138 137  K 3.4*   < > 3.2* 3.2*   < > 3.9 4.1 3.4* 4.8 4.8  CL 100   < > 102 103  --   --  99 105 107  --   CO2 21*  --  22  --   --   --  20* 23 23  --   GLUCOSE 128*   < > 95 89  --   --  253* 107* 99  --   BUN 11   < > 13 14  --   --  16 21 24*  --   CREATININE 0.93   < > 1.07* 1.10*  --   --  1.19* 1.05* 1.06*  --   CALCIUM 8.5*  --  8.6*  --   --   --  8.1* 7.8* 7.7*  --   MG  --   --   --   --   --   --  1.6* 2.7* 2.6*  --   PHOS  --   --   --   --   --   --   --   --  3.9  --    < > = values in this interval not displayed.   GFR: Estimated Creatinine Clearance: 49 mL/min (A) (by C-G formula based on SCr of 1.06 mg/dL (H)). Recent Labs  Lab 05/26/23 0203 05/26/23 0400 05/26/23 0545 05/26/23 0546 05/26/23 1006 05/26/23 1525 05/27/23 0409  PROCALCITON  --   --   --   --  94.45  --   --   WBC 15.9*  --  21.7*  --  23.1*  --  24.4*  LATICACIDVEN 5.1* 4.0*  --  3.2* 3.0* 1.6  --     Liver Function Tests: Recent Labs  Lab 05/25/23 1544 05/27/23 0409  AST 39 30  ALT 13 15  ALKPHOS 70 70  BILITOT 1.2 0.5  PROT 7.2 5.7*  ALBUMIN 2.9* 2.0*   No results for input(s): "LIPASE", "AMYLASE" in the last 168 hours. Recent Labs  Lab 05/26/23 1255  AMMONIA 19    ABG    Component Value Date/Time   PHART 7.384 05/27/2023 0412   PCO2ART 40.9 05/27/2023 0412   PO2ART 125 (H) 05/27/2023 0412   HCO3 24.4 05/27/2023 0412   TCO2 26 05/27/2023 0412   ACIDBASEDEF 1.0 05/27/2023 0412   O2SAT 79.4 05/27/2023 0813     Coagulation Profile: Recent Labs  Lab 05/25/23 1642  INR 1.1    Cardiac Enzymes: Recent Labs  Lab 05/25/23 1642 05/25/23 2227  CKTOTAL 672* 718*    HbA1C: Hgb A1c MFr Bld  Date/Time Value Ref Range Status  05/26/2023 12:55 PM 5.6 4.8 - 5.6 % Final    Comment:    (NOTE) Pre diabetes:          5.7%-6.4%  Diabetes:              >6.4%  Glycemic control for   <7.0% adults with diabetes   07/27/2012 12:13 AM 5.7 (H) <5.7 % Final     Comment:    (NOTE)                                                                       According to the ADA Clinical Practice Recommendations for 2011, when HbA1c is used as a screening test:  >=6.5%   Diagnostic of Diabetes Mellitus           (if abnormal result is confirmed) 5.7-6.4%   Increased risk of developing Diabetes Mellitus References:Diagnosis and Classification of Diabetes Mellitus,Diabetes Care,2011,34(Suppl 1):S62-S69 and Standards of Medical Care in         Diabetes - 2011,Diabetes Care,2011,34 (Suppl 1):S11-S61.    CBG: Recent Labs  Lab 05/26/23 1532 05/26/23 1959 05/26/23 2343 05/27/23 0339 05/27/23 0746  GLUCAP 114* 95 100* 93 81    Review of Systems:   Unable to obtain  Past Medical History:  She,  has a past medical history of Asthma and Hypertension.   Surgical History:   Past Surgical History:  Procedure Laterality Date   ABDOMINAL HYSTERECTOMY       Social History:   reports that she has never smoked. She  does not have any smokeless tobacco history on file. She reports that she does not drink alcohol and does not use drugs.   Family History:  Her family history includes Asthma in her brother, mother, and son. There is no history of Colon cancer or Colon polyps.   Allergies Allergies  Allergen Reactions   Zithromax [Azithromycin] Itching     Home Medications  Prior to Admission medications   Medication Sig Start Date End Date Taking? Authorizing Provider  mometasone-formoterol (DULERA) 100-5 MCG/ACT AERO Inhale 2 puffs into the lungs 2 (two) times daily. 07/27/12  Yes Gosrani, Nimish C, MD  albuterol (VENTOLIN HFA) 108 (90 Base) MCG/ACT inhaler Inhale into the lungs every 6 (six) hours as needed for wheezing or shortness of breath.    [provider]  amLODipine (NORVASC) 5 MG tablet Take 5 mg by mouth daily.    [provider]  D3 SUPER STRENGTH 50 MCG (2000 UT) CAPS Take 1 capsule by mouth daily. 08/03/21   [provider]     CRITICAL CARE Performed by: Darcella Gasman Nazier Neyhart   Total critical care time: 35 minutes  Critical care time was exclusive of separately billable procedures and treating other patients.  Critical care was necessary to treat or prevent imminent or life-threatening deterioration.  Critical care was time spent personally by me on the following activities: development of treatment plan with patient and/or surrogate as well as nursing, discussions with consultants, evaluation of patient's response to treatment, examination of patient, obtaining history from patient or surrogate, ordering and performing treatments and interventions, ordering and review of laboratory studies, ordering and review of radiographic studies, pulse oximetry and re-evaluation of patient's condition.   Darcella Gasman Kristiann Noyce, PA-C [facility] See Amion for pager If no response to pager , please call [phone] until 7pm After 7:00 pm call Elink  [phone]

## 2023-05-27 NOTE — Progress Notes (Signed)
 PHARMACY - ANTICOAGULATION CONSULT NOTE  Pharmacy Consult for heparin Indication: atrial fibrillation  Allergies  Allergen Reactions   Zithromax [Azithromycin] Itching    Patient Measurements: Height: 5\' 1"  (154.9 cm) Weight: 92.3 kg (203 lb 7.8 oz) IBW/kg (Calculated) : 47.8 HEPARIN DW (KG): 68.3  Vital Signs: Temp: 98.2 F (36.8 C) (04/11 1130) Temp Source: Oral (04/11 1130) BP: 121/59 (04/11 1112) Pulse Rate: 85 (04/11 1230)  Labs: Recent Labs    05/25/23 1642 05/25/23 2028 05/25/23 2227 05/26/23 0144 05/26/23 0545 05/26/23 0657 05/26/23 1006 05/26/23 2045 05/27/23 0409 05/27/23 0412 05/27/23 0715 05/27/23 0914  HGB  --    < >  --    < > 15.0   < > 14.8  --  12.4 13.3  --   --   HCT  --    < >  --    < > 44.1   < > 44.4  --  37.4 39.0  --   --   PLT  --   --   --    < > 197  --  178  --  152  --   --   --   APTT 28  --   --   --   --   --   --   --   --   --   --   --   LABPROT 14.5  --   --   --   --   --   --   --   --   --   --   --   INR 1.1  --   --   --   --   --   --   --   --   --   --   --   CREATININE  --   --  0.93   < >  --   --  1.19* 1.05* 1.06*  --   --   --   CKTOTAL 672*  --  718*  --   --   --   --   --   --   --   --   --   TROPONINIHS  --   --   --   --   --   --   --   --   --   --  92* 81*   < > = values in this interval not displayed.    Estimated Creatinine Clearance: 49 mL/min (A) (by C-G formula based on SCr of 1.06 mg/dL (H)).   Medical History: Past Medical History:  Diagnosis Date   Asthma    Hypertension     Medications:  Scheduled:   arformoterol  15 mcg Nebulization BID   budesonide (PULMICORT) nebulizer solution  0.25 mg Nebulization BID   Chlorhexidine Gluconate Cloth  6 each Topical Daily   cyanocobalamin  1,000 mcg Intramuscular Daily   famotidine  20 mg Per Tube BID   feeding supplement (PROSource TF20)  60 mL Per Tube BID   insulin aspart  0-9 Units Subcutaneous Q4H   mouth rinse  15 mL Mouth Rinse Q2H    polyethylene glycol  17 g Per Tube Daily   sodium chloride HYPERTONIC  4 mL Nebulization BID   thiamine (VITAMIN B1) injection  100 mg Intravenous Daily    Assessment: 72 yom presenting with AMS. No AC PTA. Found to be in Afib this admission.   Given AMS and concern for possible meningitis,  received LP. Per CCM okay to start heparin 3 hours after LP without bolus. LP completed on 4/11@1444 . No s/sx of bleeding.   Goal of Therapy:  Heparin level 0.3-0.7 units/ml Monitor platelets by anticoagulation protocol: Yes   Plan:  Start heparin infusion at 900 units/hr on 4/11@1800  Order heparin level in 8 hours Monitor daily HL, CBC, and for s/sx of bleeding   Thank you for allowing pharmacy to participate in this patient's care,  Sherron Monday, PharmD, BCCCP Clinical Pharmacist  Phone: [phone] 05/27/2023 3:45 PM  Please check AMION for all [facility] Pharmacy phone numbers After 10:00 PM, call Main Pharmacy [phone]

## 2023-05-27 NOTE — Progress Notes (Signed)
 Initial Nutrition Assessment  DOCUMENTATION CODES:   Not applicable  INTERVENTION:   Initiate tube feeding via OG tube: Vital 1.5 at 45 ml/h (1080 ml per day). Prosource TF20 60 ml BID. Provides 1780 kcal, 113 gm protein, 825 ml free water daily.  NUTRITION DIAGNOSIS:   Inadequate oral intake related to inability to eat as evidenced by NPO status.  GOAL:   Patient will meet greater than or equal to 90% of their needs  MONITOR:   Vent status, TF tolerance  REASON FOR ASSESSMENT:   Consult Enteral/tube feeding initiation and management  ASSESSMENT:   73 yo female admitted with AMS, acute encephalopathy after being found down at home. Patient decompensated 4/10 early AM and required transfer to the ICU and intubated later in the day. PMH includes asthma, HTN.  RVP positive for rhinovirus/enterovirus on admission. Discussed patient in ICU rounds and with RN today. Plans for LP to R/O meningitis.  Received MD Consult for TF initiation and management. OG tube in place. S/P EEG, showed no seizures.  Patient is currently intubated on ventilator support MV: 7.6 L/min Temp (24hrs), Avg:98.4 F (36.9 C), Min:97.7 F (36.5 C), Max:99.5 F (37.5 C)  Propofol: 2.7 ml/hr providing 71 kcals per day  Labs reviewed. BNP and troponin elevated, vitamin B-12 153 (L) CBG: 81  Medications reviewed and include vitamin B-12, pepcid, novolog, miralax, klor-con, levophed, propofol, IV antibiotics. IVF: LR at 50 ml/h  I/O +5 L since admission  Weight hx reviewed. Current weight is down by 15% over the past 18 months, which is not significant for the time frame.   Admit weight: 99.4 kg Weight 4/10: 88.3 kg Weight 4/11: 92.3 kg  Weight likely fluctuating with fluid status. She currently has generalized non-pitting edema per RN assessment.   NUTRITION - FOCUSED PHYSICAL EXAM:  Flowsheet Row Most Recent Value  Orbital Region No depletion  Upper Arm Region No depletion  Thoracic  and Lumbar Region No depletion  Buccal Region Unable to assess  Temple Region No depletion  Clavicle Bone Region No depletion  Clavicle and Acromion Bone Region No depletion  Scapular Bone Region No depletion  Dorsal Hand No depletion  Patellar Region No depletion  Anterior Thigh Region No depletion  Posterior Calf Region Unable to assess  Edema (RD Assessment) Unable to assess  [generalized edema per RN assessment]  Hair Reviewed  Eyes Unable to assess  Mouth Unable to assess  Skin Reviewed  Nails Reviewed       Diet Order:   Diet Order             Diet NPO time specified  Diet effective now                   EDUCATION NEEDS:   No education needs have been identified at this time  Skin:  Skin Assessment: Reviewed RN Assessment (L pretibial blister)  Last BM:  4/10 type 6  Height:   Ht Readings from Last 1 Encounters:  05/26/23 5\' 1"  (1.549 m)    Weight:   Wt Readings from Last 1 Encounters:  05/27/23 92.3 kg    BMI:  Body mass index is 38.45 kg/m.  Estimated Nutritional Needs:   Kcal:  1600-1800  Protein:  95-115 gm  Fluid:  1.6-1.8 L   Gabriel Rainwater RD, LDN, CNSC Contact via secure chat. If unavailable, use group chat "RD Inpatient."

## 2023-05-27 NOTE — TOC Initial Note (Signed)
 Transition of Care [facility]) - Initial/Assessment Note    Patient Details  Name: Morgan Morgan MRN: [id number] Date of Birth: 1950-08-14  [facility]) CM/SW Contact:    Elliot Cousin, RN Phone Number: [phone] 05/27/2023, 5:43 PM  Clinical Narrative:                  TOC CM spoke to pt's sister. Pt lives at home with minor child. Sister is emergency contact. Will continue to follow up for dc needs.    Expected Discharge Plan: Skilled Nursing Facility Barriers to Discharge: Continued Medical Work up   Patient Goals and CMS Choice            Expected Discharge Plan and Services   Discharge Planning Services: CM Consult   Living arrangements for the past 2 months: Single Family Home                                      Prior Living Arrangements/Services Living arrangements for the past 2 months: Single Family Home Lives with:: Self, Minor Children                   Activities of Daily Living   ADL Screening (condition at time of admission) Independently performs ADLs?: No Does the patient have a NEW difficulty with bathing/dressing/toileting/self-feeding that is expected to last >3 days?: Yes (Initiates electronic notice to provider for possible OT consult) Does the patient have a NEW difficulty with getting in/out of bed, walking, or climbing stairs that is expected to last >3 days?: Yes (Initiates electronic notice to provider for possible PT consult) Does the patient have a NEW difficulty with communication that is expected to last >3 days?: Yes (Initiates electronic notice to provider for possible SLP consult) Is the patient deaf or have difficulty hearing?: No Does the patient have difficulty seeing, even when wearing glasses/contacts?: No Does the patient have difficulty concentrating, remembering, or making decisions?: Yes  Permission Sought/Granted                  Emotional Assessment   Attitude/Demeanor/Rapport:  Intubated (Following Commands or Not Following Commands)          Admission diagnosis:  Delirium [R41.0] Altered mental status [R41.82] Patient Active Problem List   Diagnosis Date Noted   Rhinovirus 05/26/2023   Septic shock (HCC) 05/26/2023   Altered mental status 05/25/2023   Acute encephalopathy 05/25/2023   Polycystic liver disease 11/19/2021   Asthma exacerbation 07/26/2012   Obesity, unspecified 07/26/2012   Edema 07/26/2012   Essential hypertension, benign 07/26/2012   PCP:  Karl Bales, NP Pharmacy:   [facility] [phone] - [place], [place] - [address] AT [facility] [address] [place] [address] Phone: [phone] Fax: [phone]  [facility] - [place], [place] - [address] [place] [address] Phone: [phone] Fax: [phone]     Social Drivers of Health (SDOH) Social History: SDOH Screenings   Food Insecurity: Patient Unable To Answer (05/26/2023)  Housing: Patient Unable To Answer (05/26/2023)  Transportation Needs: Patient Unable To Answer (05/26/2023)  Utilities: Patient Unable To Answer (05/26/2023)  Social Connections: Patient Unable To Answer (05/26/2023)  Tobacco Use: Unknown (05/25/2023)   SDOH Interventions:     Readmission Risk Interventions     No data to display

## 2023-05-28 ENCOUNTER — Inpatient Hospital Stay (HOSPITAL_COMMUNITY)

## 2023-05-28 DIAGNOSIS — M4646 Discitis, unspecified, lumbar region: Secondary | ICD-10-CM | POA: Diagnosis present

## 2023-05-28 DIAGNOSIS — R410 Disorientation, unspecified: Secondary | ICD-10-CM

## 2023-05-28 DIAGNOSIS — A419 Sepsis, unspecified organism: Secondary | ICD-10-CM | POA: Diagnosis not present

## 2023-05-28 DIAGNOSIS — Z9911 Dependence on respirator [ventilator] status: Secondary | ICD-10-CM

## 2023-05-28 DIAGNOSIS — G9340 Encephalopathy, unspecified: Secondary | ICD-10-CM | POA: Diagnosis not present

## 2023-05-28 DIAGNOSIS — R6521 Severe sepsis with septic shock: Secondary | ICD-10-CM | POA: Diagnosis not present

## 2023-05-28 DIAGNOSIS — J9601 Acute respiratory failure with hypoxia: Secondary | ICD-10-CM | POA: Diagnosis not present

## 2023-05-28 DIAGNOSIS — M4626 Osteomyelitis of vertebra, lumbar region: Secondary | ICD-10-CM | POA: Diagnosis not present

## 2023-05-28 DIAGNOSIS — I4891 Unspecified atrial fibrillation: Secondary | ICD-10-CM | POA: Diagnosis not present

## 2023-05-28 DIAGNOSIS — N179 Acute kidney failure, unspecified: Secondary | ICD-10-CM | POA: Diagnosis not present

## 2023-05-28 HISTORY — PX: IR LUMBAR PUNCTURE: IMG944

## 2023-05-28 LAB — CBC
HCT: 32.8 % — ABNORMAL LOW (ref 36.0–46.0)
Hemoglobin: 10.9 g/dL — ABNORMAL LOW (ref 12.0–15.0)
MCH: 28.4 pg (ref 26.0–34.0)
MCHC: 33.2 g/dL (ref 30.0–36.0)
MCV: 85.4 fL (ref 80.0–100.0)
Platelets: 121 10*3/uL — ABNORMAL LOW (ref 150–400)
RBC: 3.84 MIL/uL — ABNORMAL LOW (ref 3.87–5.11)
RDW: 17 % — ABNORMAL HIGH (ref 11.5–15.5)
WBC: 21.5 10*3/uL — ABNORMAL HIGH (ref 4.0–10.5)
nRBC: 0 % (ref 0.0–0.2)

## 2023-05-28 LAB — GLUCOSE, CAPILLARY
Glucose-Capillary: 111 mg/dL — ABNORMAL HIGH (ref 70–99)
Glucose-Capillary: 109 mg/dL — ABNORMAL HIGH (ref 70–99)
Glucose-Capillary: 100 mg/dL — ABNORMAL HIGH (ref 70–99)
Glucose-Capillary: 113 mg/dL — ABNORMAL HIGH (ref 70–99)
Glucose-Capillary: 94 mg/dL (ref 70–99)
Glucose-Capillary: 99 mg/dL (ref 70–99)
Glucose-Capillary: 95 mg/dL (ref 70–99)

## 2023-05-28 LAB — BASIC METABOLIC PANEL WITH GFR
Anion gap: 8 (ref 5–15)
Anion gap: 6 (ref 5–15)
BUN: 28 mg/dL — ABNORMAL HIGH (ref 8–23)
BUN: 27 mg/dL — ABNORMAL HIGH (ref 8–23)
CO2: 26 mmol/L (ref 22–32)
CO2: 25 mmol/L (ref 22–32)
Calcium: 7.7 mg/dL — ABNORMAL LOW (ref 8.9–10.3)
Calcium: 7.7 mg/dL — ABNORMAL LOW (ref 8.9–10.3)
Chloride: 104 mmol/L (ref 98–111)
Chloride: 108 mmol/L (ref 98–111)
Creatinine, Ser: 1.02 mg/dL — ABNORMAL HIGH (ref 0.44–1.00)
Creatinine, Ser: 0.74 mg/dL (ref 0.44–1.00)
GFR, Estimated: 58 mL/min — ABNORMAL LOW (ref >60)
GFR, Estimated: >60 mL/min (ref >60)
Glucose, Bld: 136 mg/dL — ABNORMAL HIGH (ref 70–99)
Glucose, Bld: 105 mg/dL — ABNORMAL HIGH (ref 70–99)
Potassium: 3.5 mmol/L (ref 3.5–5.1)
Potassium: 3.7 mmol/L (ref 3.5–5.1)
Sodium: 138 mmol/L (ref 135–145)
Sodium: 139 mmol/L (ref 135–145)

## 2023-05-28 LAB — PHOSPHORUS: Phosphorus: 3 mg/dL (ref 2.5–4.6)

## 2023-05-28 LAB — MAGNESIUM: Magnesium: 2.5 mg/dL — ABNORMAL HIGH (ref 1.7–2.4)

## 2023-05-28 LAB — HIV ANTIBODY (ROUTINE TESTING W REFLEX): HIV Screen 4th Generation wRfx: NONREACTIVE

## 2023-05-28 LAB — HEPARIN LEVEL (UNFRACTIONATED): Heparin Unfractionated: <0.1 [IU]/mL — ABNORMAL LOW (ref 0.30–0.70)

## 2023-05-28 MED ORDER — HEPARIN (PORCINE) 25000 UT/250ML-% IV SOLN
1450.0000 [IU]/h | INTRAVENOUS | Status: AC
  Administered 2023-05-28: 1200 [IU]/h via INTRAVENOUS
  Administered 2023-05-29: 1600 [IU]/h via INTRAVENOUS
  Administered 2023-05-30: 1750 [IU]/h via INTRAVENOUS
  Administered 2023-05-31 (×2): 1850 [IU]/h via INTRAVENOUS
  Administered 2023-06-01: 1600 [IU]/h via INTRAVENOUS
  Administered 2023-06-01: 1850 [IU]/h via INTRAVENOUS
  Administered 2023-06-02: 1600 [IU]/h via INTRAVENOUS
  Administered 2023-06-03: 1450 [IU]/h via INTRAVENOUS
  Filled 2023-05-28 (×9): qty 250

## 2023-05-28 MED ORDER — SODIUM CHLORIDE 0.9 % IV SOLN
2.0000 g | INTRAVENOUS | Status: DC
  Administered 2023-05-28 – 2023-05-30 (×13): 2 g via INTRAVENOUS
  Filled 2023-05-28 (×15): qty 2000

## 2023-05-28 MED ORDER — DEXTROSE 5 % IV SOLN
700.0000 mg | Freq: Three times a day (TID) | INTRAVENOUS | Status: DC
  Administered 2023-05-28 – 2023-05-30 (×6): 700 mg via INTRAVENOUS
  Filled 2023-05-28 (×9): qty 14

## 2023-05-28 MED ORDER — LIDOCAINE HCL 1 % IJ SOLN
10.0000 mL | Freq: Once | INTRAMUSCULAR | Status: AC
  Administered 2023-05-28: 3 mL
  Filled 2023-05-28: qty 10

## 2023-05-28 NOTE — Progress Notes (Signed)
 NAME:  Morgan Morgan, MRN:  [id number], DOB:  16-Aug-1950, LOS: 3 ADMISSION DATE:  05/25/2023, CONSULTATION DATE:  05/28/2023  REFERRING MD:  Sonia Durand, CHIEF COMPLAINT: Altered mental status  History of Present Illness:  Patient is unresponsive, history obtained via chart review. 73 year old woman BIBEMS as a code stroke after being found down by her grandson.  Initial neuroexam she was obtunded with disconjugate gaze, grimaces to pain, withdrawing both lower extremities and localizing in uppers.  Head CT and CT angio was negative as well as UDS Initial labs significant for mild leukocytosis and thrombocytopenia with lactate of 3.3 and CK of 672 with hypokalemia.  Respiratory viral panel was positive for rhinovirus. She subsequently developed fever She was empirically treated with ceftriaxone, vancomycin and ampicillin with acyclovir Repeat labs showed lactate rising to 4.2, PCCM consulted due to concern for ability to maintain airway EKG showed atrial fibrillation MRI brain was negative  Pertinent  Medical History  Asthma Hypertension  Significant Hospital Events: Including procedures, antibiotic start and stop dates in addition to other pertinent events   4/9 admitted with AMS, worsened overnight requiring intubation and ICU txfr 4/11 remains intubated with mental status precluding extubation, plan for LP with IR today  Interim History / Subjective:  Overnight ID consulted based on findings of possible discitis on MRI.   Objective   Blood pressure (!) 113/57, pulse 88, temperature 98.8 F (37.1 C), temperature source Core, resp. rate 20, height 5\' 1"  (1.549 m), weight 93 kg, SpO2 99%. CVP:  [4 mmHg-19 mmHg] 5 mmHg  Vent Mode: PSV;CPAP FiO2 (%):  [40 %] 40 % Set Rate:  [20 bmp] 20 bmp Vt Set:  [380 mL] 380 mL PEEP:  [5 cmH20] 5 cmH20 Pressure Support:  [8 cmH20] 8 cmH20 Plateau Pressure:  [14 cmH20] 14 cmH20   Intake/Output Summary (Last 24 hours) at 05/28/2023 1234 Last data  filed at 05/28/2023 1100 Gross per 24 hour  Intake 3805.35 ml  Output 2050 ml  Net 1755.35 ml   Filed Weights   05/26/23 0327 05/27/23 0515 05/28/23 0425  Weight: 88.3 kg 92.3 kg 93 kg     General: critically ill appearing woman lying in bed in NAD HEENT: [place]/AT, eyes anicteric Neuro: examined on propofol and fentanyl; RASS -5, somewhat reactive to pain CV: S1S2, RRR PULM:  CTAB GI: soft, NT Extremities: no significant edema Skin: warm, dry, no rashes  Labs: BUN 28 Cr 1.02 WBC 21.5 H/H 10.9/32.8 Platelets 121    Micro: 4/9 Resp pathogen panel +rhinovirus/enterovirus 4/10 Bcx2> 4/9 Covid/flu neg  Abx: Acyclovir 4/9- Ampicillin 4/9 Ceftriaxone 4/9- Vancomycin 4/9-  Tubes/lines/drains  4/9 ETT 4/10 L rad art line, L internal jugular CVC 4/11 foley  I/O: 735cc UOP yesterday Net positive 3.8L since admission   Imaging: 4/9 CT/CTA head>no acute infarct or LVO 4/10 MRI brain>paranasal sinus thickening   Resolved Hospital Problem list     Assessment & Plan:    Acute encephalopathy- septic vs due to CNS infection. Low suspicion for drugs or meds. No obvious metabolic causes.  TSH & ammonia WNL -appreciate neurology's management -PAD protocol -empiric meningitis antibiotics, which should cover most sources of sepsis.  Septic shock  -Continue broad-spectrum antibiotics - LP completed today and sent for culture.  If not revealing may need biopsy of disc space - Vasopressors as required to maintain MAP greater than 65   Acute hypoxic respiratory failure due to encephalopathy -LTVV - VAP prevention protocol - PAD protocol for sedation, goal RASS 0  to -1 - Daily SAT and SBT as appropriate.  Mental status currently precludes extubation unless for palliative intent  Rhinovirus infection -Supportive care, droplet precautions   -appreciate neurology following  -continue empiric acyclovir, ampicillin, ceftriaxone and vancomycin -LP with IR pending  today - most likely current diagnosis is viral meningitis  -follow blood cultures, send UC and tracheal aspirate  -ammonia WNL -requiring low dose norepi to maintain MAP >65 -start Thiamine 100mg  qd -fentanyl and propofol for PAD protocol  --Maintain full vent support with SAT/SBT as tolerated -titrate Vent setting to maintain SpO2 greater than or equal to 90%. -HOB elevated 30 degrees. -Plateau pressures less than 30 cm H20.  -Follow chest x-ray, ABG prn.   -Bronchial hygiene and RT/bronchodilator protocol.  Atrial fibrillation, new onset HFrEF, EF 20-25%> possibly due to sepsis Type II MI/ trop leak of critical illness Family reports that she has had a "mild heart attack" in the past -Continue amiodarone - Heparin, on hold for several hours before and after LP.  Dosing per pharmacy. - Eventually needs reassessment and GDMT.  Hemodynamic instability precludes this at this time.  AKI -Strict I's/O - Renally dose meds and avoid nephrotoxic meds - Continue to monitor  Acute anemia, likely delusional Thrombocytopenia due to sepsis - Monitor; no current indication for transfusion   Hypokalemia; resolved Hypomagnesemia ; resolved -creatinine slightly rising, still making urine -continue to monitoring  -follow I/O -replace electrolytes prn   Ms. Eid' sister was updated at bedside.  All questions were answered to the best of my ability.  Understands there is still uncertainty about her diagnosis and prognosis.  Best Practice (right click and "Reselect all SmartList Selections" daily)   Diet/type: TF DVT prophylaxis: heparin Pressure ulcer(s): N/A GI prophylaxis: Pepcid Lines: Central line Foley:  yes and still needed  Code Status:  full code Last date of multidisciplinary goals of care discussion [4/11 family updated at the bedside]  Labs   CBC: Recent Labs  Lab 05/25/23 1224 05/25/23 2028 05/26/23 0203 05/26/23 0408 05/26/23 0545 05/26/23 0657 05/26/23 0900  05/26/23 1006 05/27/23 0409 05/27/23 0412 05/28/23 0421  WBC 15.6*  --  15.9*  --  21.7*  --   --  23.1* 24.4*  --  21.5*  NEUTROABS 14.4*  --   --   --  19.1*  --   --   --   --   --   --   HGB 14.8   < > 9.6*   < > 15.0   < > 15.0 14.8 12.4 13.3 10.9*  HCT 45.0   < > 34.0*   < > 44.1   < > 44.0 44.4 37.4 39.0 32.8*  MCV 86.2  --  99.7  --  83.4  --   --  84.4 85.2  --  85.4  PLT 143*  --  118*  --  197  --   --  178 152  --  121*   < > = values in this interval not displayed.    Basic Metabolic Panel: Recent Labs  Lab 05/26/23 0403 05/26/23 0408 05/26/23 0657 05/26/23 1006 05/26/23 2045 05/27/23 0409 05/27/23 0412 05/28/23 0421  NA 139 138   < > 133* 138 138 137 138  K 3.2* 3.2*   < > 4.1 3.4* 4.8 4.8 3.5  CL 102 103  --  99 105 107  --  104  CO2 22  --   --  20* 23 23  --  26  GLUCOSE 95 89  --  253* 107* 99  --  136*  BUN 13 14  --  16 21 24*  --  28*  CREATININE 1.07* 1.10*  --  1.19* 1.05* 1.06*  --  1.02*  CALCIUM 8.6*  --   --  8.1* 7.8* 7.7*  --  7.7*  MG  --   --   --  1.6* 2.7* 2.6*  --  2.5*  PHOS  --   --   --   --   --  3.9  --  3.0   < > = values in this interval not displayed.   GFR: Estimated Creatinine Clearance: 51.1 mL/min (A) (by C-G formula based on SCr of 1.02 mg/dL (H)). Recent Labs  Lab 05/26/23 0400 05/26/23 0545 05/26/23 0546 05/26/23 1006 05/26/23 1525 05/27/23 0409 05/28/23 0421  PROCALCITON  --   --   --  94.45  --   --   --   WBC  --  21.7*  --  23.1*  --  24.4* 21.5*  LATICACIDVEN 4.0*  --  3.2* 3.0* 1.6  --   --      This patient is critically ill with multiple organ system failure which requires frequent high complexity decision making, assessment, support, evaluation, and titration of therapies. This was completed through the application of advanced monitoring technologies and extensive interpretation of multiple databases. During this encounter critical care time was devoted to patient care services described in this note for  42 minutes.  Joesph Mussel, DO 05/28/23 7:57 PM [facility]  For contact information, see Amion. If no response to pager, please call PCCM consult pager. After hours, 7PM- 7AM, please call Elink.

## 2023-05-28 NOTE — Progress Notes (Signed)
   05/28/23 1945  Vent Select  Invasive or Noninvasive Invasive  Adult Vent Y  Airway 7.5 mm  Placement Date/Time: 05/26/23 0615   Placed By: ICU physician  Airway Device: Endotracheal Tube  ETT Types: Oral  Size (mm): 7.5 mm  Cuffed: Cuffed  Insertion attempts: 1  Airway Equipment: Video Laryngoscope;Lighted Stylet  Placement Confirmation: ET...  Secured at (cm) 22 cm  Measured From Lips  Secured Location Right  Secured By English as a second language teacher No  Tube Holder Repositioned Yes  Prone position No  Cuff Pressure (cm H2O) Clear OR 27-39 CmH2O  Site Condition Cool  Adult Ventilator Settings  Vent Type Servo i  Humidity HME  Vent Mode PRVC  Vt Set 380 mL  Set Rate 20 bmp  FiO2 (%) 40 % (Simultaneous filing. User may not have seen previous data.)  I Time 0.9 Sec(s)  PEEP 5 cmH20  Adult Ventilator Measurements  Peak Airway Pressure 13 L/min  Mean Airway Pressure 8 cmH20  Plateau Pressure 14 cmH20  Resp Rate Spontaneous 12 br/min  Resp Rate Total 32 br/min  Exhaled Vt 328 mL  Measured Ve 7.2 L  I:E Ratio Measured 1:1.7  Auto PEEP 0 cmH20  Total PEEP 5 cmH20  SpO2 100 %  Adult Ventilator Alarms  Alarms On Y  Ve High Alarm 21 L/min  Ve Low Alarm 4 L/min  Resp Rate High Alarm 40 br/min  Resp Rate Low Alarm 10  PEEP Low Alarm 3 cmH2O  Press High Alarm 40 cmH2O  T Apnea 20 sec(s)  VAP Prevention  HOB> 30 Degrees Y  Equipment wiped down Yes  Daily Weaning Assessment  Daily Assessment of Readiness to Wean Wean protocol criteria met (SBT performed)  Breath Sounds  Bilateral Breath Sounds Diminished  Vent Respiratory Assessment  Level of Consciousness Responds to Pain  Respiratory Pattern Regular;Unlabored  Airway Suctioning/Secretions  Suction Type ETT  Suction Device  Catheter  Secretion Amount Small  Secretion Color White;Pink tinged  Secretion Consistency Thick;Thin  Suction Tolerance Tolerated well  Suctioning Adverse Effects None  [place] Agitation  Sedation Scale  [place] Agitation Sedation Scale (RASS) -2  RASS Goal -2   Pt placed on full support to rest for the night

## 2023-05-28 NOTE — Progress Notes (Signed)
 Pt transported from 2H16 to IR and back by RT and RN w/o complicatons

## 2023-05-28 NOTE — Progress Notes (Signed)
 Pharmacy Antibiotic Note  Morgan Morgan is a 73 y.o. female admitted on 05/25/2023 with acute encephalopathy and concern for meningitis.  Pharmacy has been consulted for acyclovir dosing. Patient received vancomycin, ceftriaxone, and ampicillin in the ED on 05/25/23.   Cr has improved slightly, LP unable to be drawn, ID following.  Plan: Continue vancomycin 750 mg IV every 12 hours Adjust ampicillin to 2g IV q4h Adjust acyclovir to 10mg /kg IV q8h Continue ceftriaxone 2g IV q12h  Height: 5\' 1"  (154.9 cm) Weight: 93 kg (205 lb 0.4 oz) IBW/kg (Calculated) : 47.8  Temp (24hrs), Avg:98.4 F (36.9 C), Min:98.2 F (36.8 C), Max:98.8 F (37.1 C)  Recent Labs  Lab 05/26/23 0203 05/26/23 0400 05/26/23 0403 05/26/23 0408 05/26/23 0545 05/26/23 0546 05/26/23 1006 05/26/23 1525 05/26/23 2045 05/27/23 0409 05/27/23 1530 05/28/23 0421  WBC 15.9*  --   --   --  21.7*  --  23.1*  --   --  24.4*  --  21.5*  CREATININE  --   --    < > 1.10*  --   --  1.19*  --  1.05* 1.06*  --  1.02*  LATICACIDVEN 5.1* 4.0*  --   --   --  3.2* 3.0* 1.6  --   --   --   --   VANCOTROUGH  --   --   --   --   --   --   --   --   --   --  19  --    < > = values in this interval not displayed.    Estimated Creatinine Clearance: 51.1 mL/min (A) (by C-G formula based on SCr of 1.02 mg/dL (H)).    Allergies  Allergen Reactions   Zithromax [Azithromycin] Itching    Antimicrobials this admission: Ampicillin 4/9 x1 Ceftriaxone 4/9 x1 Vancomycin 4/9 x1 Acyclovir 4/9 >>  Microbiology results: 4/9 RVP:   Levin Reamer, PharmD, BCPS, [facility] Clinical Pharmacist [phone] Please check AMION for all [facility] Pharmacy numbers 05/28/2023

## 2023-05-28 NOTE — Progress Notes (Signed)
 NEUROLOGY CONSULT FOLLOW UP NOTE   Date of service: May 28, 2023 Patient Name: Morgan Morgan MRN:  [id number] DOB:  [birth year]  Interval Hx/subjective   No significant changes unfortunately LP was not able to be obtained despite multiple attempts with fluoroscopy  Vitals   Vitals:   05/28/23 0637 05/28/23 0642 05/28/23 0645 05/28/23 0700  BP:      Pulse: 84 76 76 80  Resp: (!) 22 20 (!) 22 (!) 21  Temp:      TempSrc:      SpO2: 100% 100% 99% 100%  Weight:      Height:         Body mass index is 38.74 kg/m.  Physical Exam   General: Intubated and sedated Her neck does feel stiff  Neurologic Examination    MS: Does not open eyes or follow commands CN: Pupils are reactive, she has an right exotropia. Motor: Withdraws to stimulation bilaterally Sensory: As above  Medications  Current Facility-Administered Medications:    acetaminophen (TYLENOL) suppository 650 mg, 650 mg, Rectal, Q8H PRN, Sheree Dieter, MD, 650 mg at 05/26/23 0231   acyclovir (ZOVIRAX) 700 mg in dextrose 5 % 100 mL IVPB, 700 mg, Intravenous, Q12H, Katsaros, Lindsey R, RPH, Last Rate: 114 mL/hr at 05/28/23 0849, 700 mg at 05/28/23 0849   ampicillin (OMNIPEN) 2 g in sodium chloride 0.9 % 100 mL IVPB, 2 g, Intravenous, Q6H, Klus, Jesse L, RPH, Stopped at 05/28/23 0235   arformoterol (BROVANA) nebulizer solution 15 mcg, 15 mcg, Nebulization, BID, Joesph Mussel, DO, 15 mcg at 05/28/23 0801   budesonide (PULMICORT) nebulizer solution 0.25 mg, 0.25 mg, Nebulization, BID, Joesph Mussel, DO, 0.25 mg at 05/28/23 0801   cefTRIAXone (ROCEPHIN) 2 g in sodium chloride 0.9 % 100 mL IVPB, 2 g, Intravenous, Q12H, Jackolyn Masker, MD, Last Rate: 200 mL/hr at 05/28/23 0852, 2 g at 05/28/23 1478   Chlorhexidine Gluconate Cloth 2 % PADS 6 each, 6 each, Topical, Daily, Bevelyn Bryant, MD, 6 each at 05/27/23 1028   cyanocobalamin (VITAMIN B12) injection 1,000 mcg, 1,000 mcg, Intramuscular, Daily, Augustin Leber, MD,  1,000 mcg at 05/27/23 1017   docusate (COLACE) 50 MG/5ML liquid 100 mg, 100 mg, Per Tube, BID PRN, Alva, Rakesh V, MD   famotidine (PEPCID) tablet 20 mg, 20 mg, Per Tube, BID, Alva, Rakesh V, MD, 20 mg at 05/27/23 2218   feeding supplement (PROSource TF20) liquid 60 mL, 60 mL, Per Tube, BID, Joesph Mussel, DO, 60 mL at 05/27/23 1858   feeding supplement (VITAL 1.5 CAL) liquid 1,000 mL, 1,000 mL, Per Tube, Continuous, Joesph Mussel, DO, Last Rate: 45 mL/hr at 05/28/23 0700, Infusion Verify at 05/28/23 0700   fentaNYL (SUBLIMAZE) bolus via infusion 25-100 mcg, 25-100 mcg, Intravenous, Q30 min PRN, Bitonti, Michael T, RPH, 100 mcg at 05/27/23 1320   fentaNYL in NS (50mcg/ml) infusion-PREMIX, 0-400 mcg/hr, Intravenous, Continuous, Gleason, Patt Boozer, PA-C, Last Rate: 2.5 mL/hr at 05/28/23 0700, 25 mcg/hr at 05/28/23 0700   heparin ADULT infusion 100 units/mL (25000 units/250mL), 1,200 Units/hr, Intravenous, Continuous, Bryk, Veronda P, RPH, Last Rate: 12 mL/hr at 05/28/23 0700, 1,200 Units/hr at 05/28/23 0700   insulin aspart (novoLOG) injection 0-9 Units, 0-9 Units, Subcutaneous, Q4H, Joesph Mussel, DO, 1 Units at 05/27/23 2340   lactated ringers infusion, , Intravenous, Continuous, Gleason, Patt Boozer, PA-C, Last Rate: 50 mL/hr at 05/28/23 0700, Infusion Verify at 05/28/23 0700   metoprolol tartrate (LOPRESSOR) injection 2.5-5 mg, 2.5-5  mg, Intravenous, Q3H PRN, Alva, Rakesh V, MD, 5 mg at 05/26/23 8295   norepinephrine (LEVOPHED) 4mg  in (0.016 mg/mL) premix infusion, 0-40 mcg/min, Intravenous, Titrated, Gleason, Patt Boozer, PA-C, Last Rate: 11.25 mL/hr at 05/28/23 0700, 3 mcg/min at 05/28/23 0700   Oral care mouth rinse, 15 mL, Mouth Rinse, Q2H, Lind Repine, MD, 15 mL at 05/28/23 6213   Oral care mouth rinse, 15 mL, Mouth Rinse, PRN, Alva, Rakesh V, MD   polyethylene glycol (MIRALAX / GLYCOLAX) packet 17 g, 17 g, Per Tube, Daily, Alva, Rakesh V, MD, 17 g at 05/27/23 1017   propofol  (DIPRIVAN) 1000 MG/100ML infusion, 0-50 mcg/kg/min, Intravenous, Continuous, Lind Repine, MD, Stopped at 05/28/23 0602   sodium chloride HYPERTONIC 3 % nebulizer solution 4 mL, 4 mL, Nebulization, BID, Alva, Rakesh V, MD, 4 mL at 05/28/23 0801   thiamine (VITAMIN B1) injection 100 mg, 100 mg, Intravenous, Daily, Gleason, Patt Boozer, PA-C, 100 mg at 05/27/23 1408   vancomycin (VANCOREADY) IVPB 750 mg/150 mL, 750 mg, Intravenous, Q12H, Ozie Bo, RPH, Last Rate: 150 mL/hr at 05/28/23 0700, Infusion Verify at 05/28/23 0700  Labs and Diagnostic Imaging  Leukocytosis increased from 15.6->21  Coags are normal  Infectious workup: UA was negative Respiratory panel positive only for rhinovirus COVID-negative CXR with decreased aeration to the left base, indeterminate  Imaging(Personally reviewed): MRI of the brain is negative CT angio without LVO  Assessment   Morgan Morgan is a 73 y.o. female with acute encephalopathy of unclear origin.  Her mental status worsened overnight, and she was intubated.  She has been started on broad-spectrum meningitic coverage and given that she is febrile without source, and densely encephalopathic, I agree with having done this.    She does feel like she has some degree of meningismus, so I do continue to have high concern for CNS infection.  No evidence of ongoing seizure activity.  Her B12 is low, though I do not think it was the primary etiology of her presentation, I certainly think it should be repleted and therefore I have started her on B12 shots.  It appears that she may have osteomyelitis, and it is possible especially in the setting of her low B12, that this represents delirium in the setting of other infection, but especially given neck stiffness that I have appreciated I would favor ruling out meningitis if possible, or empiric treatment if not.  Recommendations  LP for protein, glucose, cells, culture, meningoencephalitis panel Continue  empiric antibiotics until infection has been ruled out Neurology will continue to follow ______________________________________________________________________  Ann Keto, MD [facility]   If 7pm- 7am, please page neurology on call as listed in AMION. 05/28/2023  9:09 AM

## 2023-05-28 NOTE — Progress Notes (Signed)
 PHARMACY - ANTICOAGULATION CONSULT NOTE  Pharmacy Consult for heparin Indication: atrial fibrillation  Labs: Recent Labs    05/25/23 1642 05/25/23 2028 05/25/23 2227 05/26/23 0144 05/26/23 0545 05/26/23 0657 05/26/23 1006 05/26/23 2045 05/27/23 0409 05/27/23 0412 05/27/23 0715 05/27/23 0914 05/28/23 0211  HGB  --    < >  --    < > 15.0   < > 14.8  --  12.4 13.3  --   --   --   HCT  --    < >  --    < > 44.1   < > 44.4  --  37.4 39.0  --   --   --   PLT  --   --   --    < > 197  --  178  --  152  --   --   --   --   APTT 28  --   --   --   --   --   --   --   --   --   --   --   --   LABPROT 14.5  --   --   --   --   --   --   --   --   --   --   --   --   INR 1.1  --   --   --   --   --   --   --   --   --   --   --   --   HEPARINUNFRC  --   --   --   --   --   --   --   --   --   --   --   --  <0.10*  CREATININE  --   --  0.93   < >  --   --  1.19* 1.05* 1.06*  --   --   --   --   CKTOTAL 672*  --  718*  --   --   --   --   --   --   --   --   --   --   TROPONINIHS  --   --   --   --   --   --   --   --   --   --  92* 81*  --    < > = values in this interval not displayed.   Assessment: 72yo female subtherapeutic on heparin with initial conservative dosing for new Afib in the setting of recent LP; no infusion issues or signs of bleeding per RN.  Goal of Therapy:  Heparin level 0.3-0.7 units/ml   Plan:  Increase heparin infusion by 3 units/kg/hr to 1200 units/hr. Check level in 6-8 hours.   Lonnie Roberts, PharmD, BCPS 05/28/2023 2:56 AM

## 2023-05-28 NOTE — Procedures (Signed)
 Interventional Radiology Procedure Note  Procedure: Lumbar puncture under fluoroscopy  Complications: None  Estimated Blood Loss: < 10 mL  Findings: 20 G needle passes at L3 from right translaminar approach with return of 7 mL of bloody fluid, likely reflecting traumatic tap.  Unable to collect more than 7 mL volume. Unable to measure accurate CSF pressure.  Nonda Bays. Nereida Banning, M.D Pager:  [phone]

## 2023-05-28 NOTE — Progress Notes (Signed)
 PHARMACY - ANTICOAGULATION CONSULT NOTE  Pharmacy Consult for heparin Indication: atrial fibrillation  Allergies  Allergen Reactions   Zithromax [Azithromycin] Itching    Patient Measurements: Height: 5\' 1"  (154.9 cm) Weight: 93 kg (205 lb 0.4 oz) IBW/kg (Calculated) : 47.8 HEPARIN DW (KG): 68.3  Vital Signs: Temp: 98.8 F (37.1 C) (04/12 0312) Temp Source: Core (04/12 0312) Pulse Rate: 85 (04/12 1300)  Labs: Recent Labs    05/25/23 1642 05/25/23 2028 05/25/23 2227 05/26/23 0144 05/26/23 1006 05/26/23 2045 05/27/23 0409 05/27/23 0412 05/27/23 0715 05/27/23 0914 05/28/23 0211 05/28/23 0421  HGB  --    < >  --    < > 14.8  --  12.4 13.3  --   --   --  10.9*  HCT  --    < >  --    < > 44.4  --  37.4 39.0  --   --   --  32.8*  PLT  --   --   --    < > 178  --  152  --   --   --   --  121*  APTT 28  --   --   --   --   --   --   --   --   --   --   --   LABPROT 14.5  --   --   --   --   --   --   --   --   --   --   --   INR 1.1  --   --   --   --   --   --   --   --   --   --   --   HEPARINUNFRC  --   --   --   --   --   --   --   --   --   --  <0.10*  --   CREATININE  --   --  0.93   < > 1.19* 1.05* 1.06*  --   --   --   --  1.02*  CKTOTAL 672*  --  718*  --   --   --   --   --   --   --   --   --   TROPONINIHS  --   --   --   --   --   --   --   --  92* 81*  --   --    < > = values in this interval not displayed.    Estimated Creatinine Clearance: 51.1 mL/min (A) (by C-G formula based on SCr of 1.02 mg/dL (H)).   Medical History: Past Medical History:  Diagnosis Date   Asthma    Hypertension     Medications:  Scheduled:   arformoterol  15 mcg Nebulization BID   budesonide (PULMICORT) nebulizer solution  0.25 mg Nebulization BID   Chlorhexidine Gluconate Cloth  6 each Topical Daily   famotidine  20 mg Per Tube BID   feeding supplement (PROSource TF20)  60 mL Per Tube BID   insulin aspart  0-9 Units Subcutaneous Q4H   mouth rinse  15 mL Mouth Rinse Q2H    polyethylene glycol  17 g Per Tube Daily   sodium chloride HYPERTONIC  4 mL Nebulization BID   thiamine (VITAMIN B1) injection  100 mg Intravenous Daily    Assessment: 72 yom presenting with  AMS. No AC PTA. Found to be in Afib this admission, heparin per pharmacy ordered.  Heparin infusion held this am for LP, ok to resume with no bolus at 1830 per CCM.   Goal of Therapy:  Heparin level 0.3-0.7 units/ml Monitor platelets by anticoagulation protocol: Yes   Plan:  Resume heparin 1200 units/h no bolus at 1830 Check heparin level in 8 hours  Levin Reamer, PharmD, [place], [facility] Clinical Pharmacist [phone] Please check AMION for all [facility] Pharmacy numbers 05/28/2023

## 2023-05-28 NOTE — Plan of Care (Signed)
  Problem: Clinical Measurements: Goal: Will remain free from infection Outcome: Progressing Goal: Respiratory complications will improve Outcome: Progressing Goal: Cardiovascular complication will be avoided Outcome: Progressing   Problem: Nutrition: Goal: Adequate nutrition will be maintained Outcome: Progressing   Problem: Safety: Goal: Ability to remain free from injury will improve Outcome: Progressing   Problem: Skin Integrity: Goal: Risk for impaired skin integrity will decrease Outcome: Progressing   Problem: Respiratory: Goal: Ability to maintain a clear airway and adequate ventilation will improve Outcome: Progressing   Problem: Fluid Volume: Goal: Ability to maintain a balanced intake and output will improve Outcome: Progressing   Problem: Nutritional: Goal: Maintenance of adequate nutrition will improve Outcome: Progressing   Problem: Skin Integrity: Goal: Risk for impaired skin integrity will decrease Outcome: Progressing

## 2023-05-28 NOTE — Consult Note (Signed)
 [facility]                                                                                        Patient Identification: Patient Name: Morgan Morgan MRN: [id number] Admit Date: 05/25/2023 10:48 AM Today's Date: 05/28/2023 Reason for consult: Meningitis, lumbar discitis/osteomyelitis Requesting provider: CCM provider  Principal Problem:   Altered mental status Active Problems:   Acute encephalopathy   Rhinovirus   Septic shock (HCC)   Antibiotics:  Vancomycin 4/9-c Ceftriaxone 4/9- Ampicillin 4/9- Acyclovir 4/9-  Lines/Hardware: Left brachial A-line, left IJ CVC  Assessment # Acute encephalopathy r/o meningitis ( bacterial, viral vs septic due to lumbar infection ) 4/10 EEG severe diffuse encephalopathy.  No seizures or epileptiform discharges 4/10 unsuccessful several LP attempts by [facility] 4/11 dry tap by IR  # Lumbar vertebral discitis/osteomyelitis  # Septic shock/Hypotension - on low dose levophed   # Leukocytosis/Thrombocytopenia  - 2/2 above, monitor   # Rhino virus   # AKI - monitor    Recommendations  -Continue current antibacterial and antiviral coverage for meningitis( Vancomycin, ceftriaxone, ampicillin and acyclovir)  -Would get IR evaluation for lumbar disc aspiration and cultures.  I discussed with Dr. Alecia Ames at bedside and he has concerns for bacterial meningitis over viral meningitis.  He thinks there may be a path at L3-L4 where CSF can be aspirated after MRI reviewed by IR.  Will need to be careful while introducing needle into the subarachnoid space with lumbar vertebral infection. Defer to Neuro/IR if can be safely done.   - Would send CSF sample for ME panel, glucose, protein, cell count, gram stain and cultures, CSF VDRL if CSF sample obtained   - will consider getting MRI brain wwo in next couple of days once Cr better - Monitor CBC and CMP on  abtx - Droplet precautions  D/w PCCM and Neurology   Rest of the management as per the primary team. Please call with questions or concerns.  Thank you for the consult  __________________________________________________________________________________________________________ HPI and Hospital Course(history obtained from chart review as patient is intubated and no family member at bedside) 73 year old female with PMH of asthma, hypertension who presented to the ED on 4/9 as a code stroke with altered mental status.  Unknown last seen normal, was found minimally responsive on the floor in her house by EMS.  Patient apparently lives with 28-year-old grandson  At ED, initially afebrile but later became febrile altered, not following commands with nonpurposeful extremities movement, tachypneic with variable heart rate  Labs remarkable for K3.3, creatinine 0.9, WBC 15.6, platelets 143, lactic acid 3.3>4.2>14.4>5.1>4.0 RVP positive for rhinovirus UA not indicative of UTI 4/10 blood cx NG in 2 days  PCR negative EKG with atrial fibrillation  CT head.  CT C-spine and CTA no acute abnormality Chest x-ray mild cardiomegaly with possible mild central pulmonary vascular congestion MRI brain no evidence of acute intracranial abnormality, no evidence of acute infarct, mild cerebral white matter chronic small vessel ischemic disease, paranasal sinus mucosal thickening  Started on empiric meningitis coverage with vancomycin, ceftriaxone, ampicillin and as acyclovir  4/10 requiring intubation, CVC and  art line placement   4/10 tte unremarkable 4/10 duplex of LE - negative for DVT   4/11 lumbar puncture was attempted with trace fluid return, no fluid collection for analysis  4/11 MRI lumbar spine posterior endplate marrow edema on the right at L2-3 with subtle enhancement and fluid signal in the disc on STIR sequences. Subtle enhancement within the disc space is suspected. Findings are concerning for  early discitis/osteomyelitis. Mild enhancing paraspinous soft tissue at the L2-3 disc level is likely reactive.  ROS: Unavailable due to intubated   Past Medical History:  Diagnosis Date   Asthma    Hypertension    Past Surgical History:  Procedure Laterality Date   ABDOMINAL HYSTERECTOMY     Scheduled Meds:  arformoterol  15 mcg Nebulization BID   budesonide (PULMICORT) nebulizer solution  0.25 mg Nebulization BID   Chlorhexidine Gluconate Cloth  6 each Topical Daily   cyanocobalamin  1,000 mcg Intramuscular Daily   famotidine  20 mg Per Tube BID   feeding supplement (PROSource TF20)  60 mL Per Tube BID   insulin aspart  0-9 Units Subcutaneous Q4H   mouth rinse  15 mL Mouth Rinse Q2H   polyethylene glycol  17 g Per Tube Daily   sodium chloride HYPERTONIC  4 mL Nebulization BID   thiamine (VITAMIN B1) injection  100 mg Intravenous Daily   Continuous Infusions:  acyclovir Stopped (05/27/23 2248)   ampicillin (OMNIPEN) IV Stopped (05/28/23 0235)   cefTRIAXone (ROCEPHIN)  IV Stopped (05/27/23 2120)   feeding supplement (VITAL 1.5 CAL) 45 mL/hr at 05/28/23 0700   fentaNYL infusion INTRAVENOUS 25 mcg/hr (05/28/23 0700)   heparin 1,200 Units/hr (05/28/23 0700)   lactated ringers 50 mL/hr at 05/28/23 0700   norepinephrine (LEVOPHED) Adult infusion 3 mcg/min (05/28/23 0700)   propofol (DIPRIVAN) infusion Stopped (05/28/23 0602)   vancomycin 150 mL/hr at 05/28/23 0700   PRN Meds:.acetaminophen, docusate, fentaNYL, metoprolol tartrate, mouth rinse  Allergies  Allergen Reactions   Zithromax [Azithromycin] Itching   Social History   Socioeconomic History   Marital status: Widowed    Spouse name: Not on file   Number of children: Not on file   Years of education: Not on file   Highest education level: Not on file  Occupational History   Not on file  Tobacco Use   Smoking status: Never   Smokeless tobacco: Not on file  Vaping Use   Vaping status: Never Used  Substance  and Sexual Activity   Alcohol use: No   Drug use: No   Sexual activity: Yes  Other Topics Concern   Not on file  Social History Narrative   Not on file   Social Drivers of Health   Financial Resource Strain: Not on file  Food Insecurity: Patient Unable To Answer (05/26/2023)   Hunger Vital Sign    Worried About Running Out of Food in the Last Year: Patient unable to answer    Ran Out of Food in the Last Year: Patient unable to answer  Transportation Needs: Patient Unable To Answer (05/26/2023)   PRAPARE - Transportation    Lack of Transportation (Medical): Patient unable to answer    Lack of Transportation (Non-Medical): Patient unable to answer  Physical Activity: Not on file  Stress: Not on file  Social Connections: Patient Unable To Answer (05/26/2023)   Social Connection and Isolation Panel [NHANES]    Frequency of Communication with Friends and Family: Patient unable to answer    Frequency of Social  Gatherings with Friends and Family: Patient unable to answer    Attends Religious Services: Patient unable to answer    Active Member of Clubs or Organizations: Patient unable to answer    Attends Club or Organization Meetings: Patient unable to answer    Marital Status: Patient unable to answer  Intimate Partner Violence: Patient Unable To Answer (05/26/2023)   Humiliation, Afraid, Rape, and Kick questionnaire    Fear of Current or Ex-Partner: Patient unable to answer    Emotionally Abused: Patient unable to answer    Physically Abused: Patient unable to answer    Sexually Abused: Patient unable to answer   Family History  Problem Relation Age of Onset   Asthma Mother    Asthma Brother    Asthma Son    Colon cancer Neg Hx    Colon polyps Neg Hx    Vitals BP (!) 113/57   Pulse 80   Temp 98.8 F (37.1 C) (Core)   Resp (!) 21   Ht 5\' 1"  (1.549 m)   Wt 93 kg   SpO2 100%   BMI 38.74 kg/m    Physical Exam Constitutional: Critically ill obese adult female lying in the  bed    Comments: Pupils bilaterally symmetrical  Cardiovascular:     Rate and Rhythm: Normal rate and regular rhythm.     Heart sounds:  Pulmonary:     Effort: Pulmonary effort is normal on vent    Comments: Coarse breath sounds bilaterally  Abdominal:     Palpations: Abdomen is soft.     Tenderness: Nondistended  Musculoskeletal:        General: No swelling or warmth or redness in peripheral joints  Skin:    Comments: No rashes or wounds including back  Neurological:     General: Intubated, sedation was stopped around 6 AM, does not follows commands, neck feels rigid    Pertinent Microbiology Results for orders placed or performed during the hospital encounter of 05/25/23  Resp panel by RT-PCR (RSV, Flu A&B, Covid) Anterior Nasal Swab     Status: None   Collection Time: 05/25/23  5:50 PM   Specimen: Anterior Nasal Swab  Result Value Ref Range Status   SARS Coronavirus 2 by RT PCR NEGATIVE NEGATIVE Final   Influenza A by PCR NEGATIVE NEGATIVE Final   Influenza B by PCR NEGATIVE NEGATIVE Final    Comment: (NOTE) The Xpert Xpress SARS-CoV-2/FLU/RSV plus assay is intended as an aid in the diagnosis of influenza from Nasopharyngeal swab specimens and should not be used as a sole basis for treatment. Nasal washings and aspirates are unacceptable for Xpert Xpress SARS-CoV-2/FLU/RSV testing.  Fact Sheet for Patients: BloggerCourse.com  Fact Sheet for Healthcare Providers: SeriousBroker.it  This test is not yet approved or cleared by the [place]  FDA and has been authorized for detection and/or diagnosis of SARS-CoV-2 by FDA under an Emergency Use Authorization (EUA). This EUA will remain in effect (meaning this test can be used) for the duration of the COVID-19 declaration under Section 564(b)(1) of the Act, 21 U.S.C. section 360bbb-3(b)(1), unless the authorization is terminated or revoked.     Resp Syncytial  Virus by PCR NEGATIVE NEGATIVE Final    Comment: (NOTE) Fact Sheet for Patients: BloggerCourse.com  Fact Sheet for Healthcare Providers: SeriousBroker.it  This test is not yet approved or cleared by the [place]  FDA and has been authorized for detection and/or diagnosis of SARS-CoV-2 by FDA under an Emergency Use Authorization (EUA). This EUA  will remain in effect (meaning this test can be used) for the duration of the COVID-19 declaration under Section 564(b)(1) of the Act, 21 U.S.C. section 360bbb-3(b)(1), unless the authorization is terminated or revoked.  Performed at [facility], [address] [place], [place] [address]   Respiratory (~20 pathogens) panel by PCR     Status: Abnormal   Collection Time: 05/25/23  5:50 PM   Specimen: Anterior Nasal Swab; Respiratory  Result Value Ref Range Status   Adenovirus NOT DETECTED NOT DETECTED Final   Coronavirus 229E NOT DETECTED NOT DETECTED Final    Comment: (NOTE) The Coronavirus on the Respiratory Panel, DOES NOT test for the novel  Coronavirus (2019 nCoV)    Coronavirus HKU1 NOT DETECTED NOT DETECTED Final   Coronavirus NL63 NOT DETECTED NOT DETECTED Final   Coronavirus OC43 NOT DETECTED NOT DETECTED Final   Metapneumovirus NOT DETECTED NOT DETECTED Final   Rhinovirus / Enterovirus DETECTED (A) NOT DETECTED Final   Influenza A NOT DETECTED NOT DETECTED Final   Influenza B NOT DETECTED NOT DETECTED Final   Parainfluenza Virus 1 NOT DETECTED NOT DETECTED Final   Parainfluenza Virus 2 NOT DETECTED NOT DETECTED Final   Parainfluenza Virus 3 NOT DETECTED NOT DETECTED Final   Parainfluenza Virus 4 NOT DETECTED NOT DETECTED Final   Respiratory Syncytial Virus NOT DETECTED NOT DETECTED Final   Bordetella pertussis NOT DETECTED NOT DETECTED Final   Bordetella Parapertussis NOT DETECTED NOT DETECTED Final   Chlamydophila pneumoniae NOT DETECTED NOT DETECTED Final    Mycoplasma pneumoniae NOT DETECTED NOT DETECTED Final    Comment: Performed at [facility], [address], [place], [place] [address]  MRSA Next Gen by PCR, Nasal     Status: None   Collection Time: 05/26/23  3:20 AM   Specimen: Nasal Mucosa; Nasal Swab  Result Value Ref Range Status   MRSA by PCR Next Gen NOT DETECTED NOT DETECTED Final    Comment: (NOTE) The GeneXpert MRSA Assay (FDA approved for NASAL specimens only), is one component of a comprehensive MRSA colonization surveillance program. It is not intended to diagnose MRSA infection nor to guide or monitor treatment for MRSA infections. Test performance is not FDA approved in patients less than 11 years old. Performed at [facility], [address], [place], [place] [address]   Culture, blood (Routine X 2) w Reflex to ID Panel     Status: None (Preliminary result)   Collection Time: 05/26/23  5:46 AM   Specimen: BLOOD LEFT HAND  Result Value Ref Range Status   Specimen Description BLOOD LEFT HAND  Final   Special Requests   Final    BOTTLES DRAWN AEROBIC AND ANAEROBIC Blood Culture adequate volume   Culture   Final    NO GROWTH 2 DAYS Performed at [facility], [address], [place], [place] [address]    Report Status PENDING  Incomplete  Culture, blood (Routine X 2) w Reflex to ID Panel     Status: None (Preliminary result)   Collection Time: 05/26/23 10:06 AM   Specimen: BLOOD RIGHT HAND  Result Value Ref Range Status   Specimen Description BLOOD RIGHT HAND  Final   Special Requests   Final    AEROBIC BOTTLE ONLY Blood Culture results may not be optimal due to an inadequate volume of blood received in culture bottles   Culture   Final    NO GROWTH 2 DAYS Performed at [facility], [address], [place], [place] [address]    Report Status PENDING  Incomplete   Pertinent Lab seen by me:    Latest Ref Rng & Units 05/28/2023    4:21 AM 05/27/2023    4:12 AM 05/27/2023    4:09 AM  CBC   WBC 4.0 - 10.5 K/uL 21.5   24.4   Hemoglobin 12.0 - 15.0 g/dL 60.4  54.0  98.1   Hematocrit 36.0 - 46.0 % 32.8  39.0  37.4   Platelets 150 - 400 K/uL 121   152       Latest Ref Rng & Units 05/28/2023    4:21 AM 05/27/2023    4:12 AM 05/27/2023    4:09 AM  CMP  Glucose 70 - 99 mg/dL 191   99   BUN 8 - 23 mg/dL 28   24   Creatinine 4.78 - 1.00 mg/dL 2.95   6.21   Sodium 308 - 145 mmol/L 138  137  138   Potassium 3.5 - 5.1 mmol/L 3.5  4.8  4.8   Chloride 98 - 111 mmol/L 104   107   CO2 22 - 32 mmol/L 26   23   Calcium 8.9 - 10.3 mg/dL 7.7   7.7   Total Protein 6.5 - 8.1 g/dL   5.7   Total Bilirubin 0.0 - 1.2 mg/dL   0.5   Alkaline Phos 38 - 126 U/L   70   AST 15 - 41 U/L   30   ALT 0 - 44 U/L   15      Pertinent Imagings/Other Imagings Plain films and CT images have been personally visualized and interpreted; radiology reports have been reviewed. Decision making incorporated into the Impression / Recommendations.  MR Lumbar Spine W Wo Contrast Result Date: 05/27/2023 CLINICAL DATA:  Lumbar radiculopathy, infection suspected. EXAM: MRI LUMBAR SPINE WITHOUT AND WITH CONTRAST TECHNIQUE: Multiplanar and multiecho pulse sequences of the lumbar spine were obtained without and with intravenous contrast. CONTRAST:  9mL GADAVIST GADOBUTROL 1 MMOL/ML IV SOLN COMPARISON:  None Available. FINDINGS: Segmentation: 5 non rib-bearing lumbar type vertebral bodies are present. The lowest fully formed vertebral body is L5. Alignment: 4 mm grade 1 anterolisthesis is present at L4-5. No other significant listhesis is present. Lumbar lordosis is preserved. Vertebrae: Posterior endplate marrow edema is present on the right at L2-3. Subtle enhancement is associated in the same area. There is some fluid signal in the disc on STIR sequences. Subtle enhancement within the disc space is suspected. Marrow signal is somewhat heterogeneous without other focal lesions or enhancement. Conus medullaris and cauda equina:  Conus extends to the L1-2 level. Conus and cauda equina appear normal. Paraspinal and other soft tissues: Hepatic cysts are again noted. Mild enhancing paraspinous soft tissue is present at the L2-3 disc level. No other significant paraspinous soft tissue abnormality is present. Disc levels: T12-L1: Normal disc signal and height is present. No focal protrusion or stenosis is present. L1-2: Normal disc signal and height is present. No focal protrusion or stenosis is present. L2-3: A left paramedian disc protrusion and asymmetric left-sided facet hypertrophy result in moderate left subarticular narrowing. Moderate foraminal stenosis is worse left than right. L3-4: A bilateral lateral disc protrusions are present, right greater than left. Moderate facet hypertrophy is noted bilaterally. Mild right subarticular stenosis is present. Moderate foraminal narrowing is worse on the right. L4-5: Uncovering of a broad-based disc protrusion results in severe central canal stenosis with crowding  of the nerve roots. Moderate right and mild left foraminal stenosis is present. L5-S1: Chronic loss of disc height and mild disc bulging is present. Mild facet hypertrophy is noted bilaterally without significant disc protrusion or stenosis. IMPRESSION: 1. Posterior endplate marrow edema on the right at L2-3 with subtle enhancement and fluid signal in the disc on STIR sequences. Subtle enhancement within the disc space is suspected. Findings are concerning for early discitis/osteomyelitis. 2. Mild enhancing paraspinous soft tissue at the L2-3 disc level is likely reactive. 3. Multilevel spondylosis of the lumbar spine as described. 4. Moderate left subarticular narrowing and moderate foraminal stenosis at L2-3. 5. Mild right subarticular stenosis and moderate foraminal narrowing at L3-4 is worse on the right. 6. Severe central canal stenosis with crowding of the nerve roots at L4-5. 7. Moderate right and mild left foraminal stenosis at  L4-5. 8. Chronic loss of disc height and mild disc bulging at L5-S1 without significant disc protrusion or stenosis. The above was relayed via text pager to Dr. Marchelle Folks on 05/27/2023 at 19:18 . Electronically Signed   By: Marin Roberts M.D.   On: 05/27/2023 19:20   DG FL GUIDED LUMBAR PUNCTURE Result Date: 05/27/2023 CLINICAL DATA:  73 year old female who presented with acute encephalopathy after being found down at her home. Stroke workup without any acute findings. Started on empiric antibiotics for possible meningitis. Request for lumbar puncture. EXAM: LUMBAR PUNCTURE UNDER FLUOROSCOPY PROCEDURE: An appropriate skin entry site was determined fluoroscopically. Operator donned sterile gloves and mask. Skin site was marked, then prepped with Betadine, draped in usual sterile fashion, and infiltrated locally with 1% lidocaine. A 20 gauge 5 inch spinal needle advanced into the thecal sac at L5-S1 from a right interlaminar approach. There was a small amount of CSF return into the hub of the needle. The table was tilted to aid in return of fluid, but the fluid would not advance past the hub. A second attempt was then made at the level of L2-3 from a right interlaminar approach with minimal CSF return. Dr. Lowella Dandy was called into the room for additional assistance. A 20 gauge 6 inch spinal needle was advanced into the thecal sac at L5-S1 from a left interlaminar approach with no fluid return. A second attempt was then made at L2-3 from a left interlaminar approach with no fluid return. A final attempt was made at L4-5 from a right interlaminar approach again without any fluid return. Imaging confirmed appropriate needle placement at each level, but no CSF was able to be collected. The needle was then removed. The patient tolerated the procedure well and there were no complications. FLUOROSCOPY: Radiation Exposure Index (as provided by the fluoroscopic device): 53.2 mGy Kerma IMPRESSION: Lumbar puncture  under fluoroscopy with trace fluid return. No fluid was collected for analysis. This exam was performed by Sherlene Shams, PA-C and Dr. Jacqualine Code. Electronically Signed   By: Richarda Overlie M.D.   On: 05/27/2023 15:49   DG CHEST PORT 1 VIEW Result Date: 05/27/2023 CLINICAL DATA:  Endotracheal tube. EXAM: PORTABLE CHEST 1 VIEW COMPARISON:  May 26, 2023. FINDINGS: Stable cardiomegaly. Endotracheal and nasogastric tubes are unchanged. Left internal jugular catheter is unchanged. Minimal bibasilar subsegmental atelectasis is noted. Bony thorax is unremarkable. IMPRESSION: Stable support apparatus. Minimal bibasilar subsegmental atelectasis. Electronically Signed   By: Lupita Raider M.D.   On: 05/27/2023 08:30   VAS [place] LOWER EXTREMITY VENOUS (DVT) Result Date: 05/26/2023  Lower Venous DVT Study Patient Name:  [facility]  Date of Exam:   05/26/2023 Medical Rec #: [id number]      Accession #:    [id number] Date of Birth: November 24, 1950       Patient Gender: F Patient Age:   78 years Exam Location:  [facility] Procedure:      VAS [place] LOWER EXTREMITY VENOUS (DVT) Referring Phys: [facility]VA --------------------------------------------------------------------------------  Indications: Swelling, and Edema.  Risk Factors: Immobility obesity and past pregnancy. Limitations: Body habitus and poor ultrasound/tissue interface. Comparison Study: None. Performing Technologist: Shona Simpson  Examination Guidelines: A complete evaluation includes B-mode imaging, spectral Doppler, color Doppler, and power Doppler as needed of all accessible portions of each vessel. Bilateral testing is considered an integral part of a complete examination. Limited examinations for reoccurring indications may be performed as noted. The reflux portion of the exam is performed with the patient in reverse Trendelenburg.  +---------+---------------+---------+-----------+----------+-------------------+ RIGHT     CompressibilityPhasicitySpontaneityPropertiesThrombus Aging      +---------+---------------+---------+-----------+----------+-------------------+ CFV      Full           Yes      Yes                                      +---------+---------------+---------+-----------+----------+-------------------+ SFJ      Full                                                             +---------+---------------+---------+-----------+----------+-------------------+ FV Prox  Full                                                             +---------+---------------+---------+-----------+----------+-------------------+ FV Mid   Full                                                             +---------+---------------+---------+-----------+----------+-------------------+ FV DistalFull                                                             +---------+---------------+---------+-----------+----------+-------------------+ PFV      Full                                                             +---------+---------------+---------+-----------+----------+-------------------+ POP      Full           Yes      Yes                                      +---------+---------------+---------+-----------+----------+-------------------+  PTV      Full                    Yes                  Not well visualized +---------+---------------+---------+-----------+----------+-------------------+ PERO     Full                    Yes                  Not well visualized +---------+---------------+---------+-----------+----------+-------------------+   +---------+---------------+---------+-----------+----------+-------------------+ LEFT     CompressibilityPhasicitySpontaneityPropertiesThrombus Aging      +---------+---------------+---------+-----------+----------+-------------------+ CFV      Full           Yes      Yes                                       +---------+---------------+---------+-----------+----------+-------------------+ SFJ      Full                                                             +---------+---------------+---------+-----------+----------+-------------------+ FV Prox  Full                                                             +---------+---------------+---------+-----------+----------+-------------------+ FV Mid   Full                                                             +---------+---------------+---------+-----------+----------+-------------------+ FV DistalFull                                                             +---------+---------------+---------+-----------+----------+-------------------+ PFV      Full                                                             +---------+---------------+---------+-----------+----------+-------------------+ POP      Full           Yes      Yes                                      +---------+---------------+---------+-----------+----------+-------------------+ PTV      Full                    Yes  Not well visualized +---------+---------------+---------+-----------+----------+-------------------+ PERO     Full                    Yes                  Not well visualized +---------+---------------+---------+-----------+----------+-------------------+     Summary: BILATERAL: - No evidence of deep vein thrombosis seen in the lower extremities, bilaterally. -No evidence of popliteal cyst, bilaterally.   *See table(s) above for measurements and observations. Electronically signed by Carolynn Sayers on 05/26/2023 at 4:53:01 PM.    Final    EEG adult Result Date: 05/26/2023 Charlsie Quest, MD     05/26/2023  4:04 PM Patient Name: Marien Manship MRN: [id number] Epilepsy Attending: Charlsie Quest Referring Physician/Provider: Mathews Argyle, NP Date: 05/26/2023 Duration: 23.20 mins Patient history: 73 y.o. female with  acute encephalopathy of unclear origin. EEG to evaluate for seizure Level of alertness: comatose, AEDs during EEG study: LEV, propofol Technical aspects: This EEG study was done with scalp electrodes positioned according to the 10-20 International system of electrode placement. Electrical activity was reviewed with band pass filter of 1-70Hz , sensitivity of 7 uV/mm, display speed of 28mm/sec with a 60Hz  notched filter applied as appropriate. EEG data were recorded continuously and digitally stored.  Video monitoring was available and reviewed as appropriate. Description: EEG showed continuous generalized low amplitude 3-5Hz  theta-delta slowing admixed with 12-14hz  beta activity. Hyperventilation and photic stimulation were not performed.    ABNORMALITY - Continuous slow, generalized  IMPRESSION: This study is suggestive of severe diffuse encephalopathy. No seizures or epileptiform discharges were seen throughout the recording.  Charlsie Quest   ECHOCARDIOGRAM COMPLETE Result Date: 05/26/2023    ECHOCARDIOGRAM REPORT   Patient Name:   Raylie Maddison Date of Exam: 05/26/2023 Medical Rec #:  [id number]     Height:       61.0 in Accession #:    [id number]    Weight:       194.7 lb Date of Birth:  02-15-1951      BSA:          1.867 m Patient Age:    72 years      BP:           159/98 mmHg Patient Gender: F             HR:           130 bpm. Exam Location:  Inpatient Procedure: 2D Echo, Cardiac Doppler and Color Doppler (Both Spectral and Color            Flow Doppler were utilized during procedure). Indications:    Atrial Fibrillation  History:        Patient has no prior history of Echocardiogram examinations.                 Arrythmias:Atrial Fibrillation; Risk Factors:Hypertension.  Sonographer:    Karma Ganja Referring Phys: Lovie Macadamia  Sonographer Comments: Technically challenging study due to limited acoustic windows and echo performed with patient supine and on artificial respirator. IMPRESSIONS  1. Left  ventricular ejection fraction, by estimation, is 20 to 25%. The left ventricle has severely decreased function. The left ventricle demonstrates global hypokinesis. There is mild left ventricular hypertrophy. Left ventricular diastolic parameters  are indeterminate.  2. Right ventricular systolic function is normal. The right ventricular size is normal. There is normal pulmonary artery systolic pressure.  3. Left atrial size was severely dilated.  4. The  mitral valve is myxomatous. Trivial mitral valve regurgitation. No evidence of mitral stenosis.  5. The aortic valve is calcified. Aortic valve regurgitation is not visualized. Aortic valve sclerosis/calcification is present, without any evidence of aortic stenosis.  6. The inferior vena cava is normal in size with greater than 50% respiratory variability, suggesting right atrial pressure of 3 mmHg. Comparison(s): No prior Echocardiogram. FINDINGS  Left Ventricle: Left ventricular ejection fraction, by estimation, is 20 to 25%. The left ventricle has severely decreased function. The left ventricle demonstrates global hypokinesis. The left ventricular internal cavity size was normal in size. There is mild left ventricular hypertrophy. Left ventricular diastolic parameters are indeterminate. Right Ventricle: The right ventricular size is normal. No increase in right ventricular wall thickness. Right ventricular systolic function is normal. There is normal pulmonary artery systolic pressure. Left Atrium: Left atrial size was severely dilated. Right Atrium: Right atrial size was normal in size. Pericardium: There is no evidence of pericardial effusion. Presence of epicardial fat layer. Mitral Valve: The mitral valve is myxomatous. Trivial mitral valve regurgitation. No evidence of mitral valve stenosis. Tricuspid Valve: The tricuspid valve is normal in structure. Tricuspid valve regurgitation is not demonstrated. No evidence of tricuspid stenosis. Aortic Valve: The  aortic valve is calcified. Aortic valve regurgitation is not visualized. Aortic valve sclerosis/calcification is present, without any evidence of aortic stenosis. Aortic valve mean gradient measures 3.7 mmHg. Aortic valve peak gradient measures 6.7 mmHg. Aortic valve area, by VTI measures 1.74 cm. Pulmonic Valve: The pulmonic valve was normal in structure. Pulmonic valve regurgitation is not visualized. No evidence of pulmonic stenosis. Aorta: The aortic root is normal in size and structure. Venous: The inferior vena cava is normal in size with greater than 50% respiratory variability, suggesting right atrial pressure of 3 mmHg. IAS/Shunts: No atrial level shunt detected by color flow Doppler.  LEFT VENTRICLE PLAX 2D LVIDd:         5.80 cm      Diastology LVIDs:         5.40 cm      LV e' medial:    10.20 cm/s LV PW:         1.10 cm      LV E/e' medial:  6.9 LV IVS:        1.00 cm      LV e' lateral:   10.60 cm/s LVOT diam:     1.90 cm      LV E/e' lateral: 6.7 LV SV:         27 LV SV Index:   14 LVOT Area:     2.84 cm  LV Volumes (MOD) LV vol d, MOD A2C: 136.0 ml LV vol d, MOD A4C: 126.0 ml LV vol s, MOD A2C: 105.0 ml LV vol s, MOD A4C: 101.0 ml LV SV MOD A2C:     31.0 ml LV SV MOD A4C:     126.0 ml LV SV MOD BP:      30.5 ml RIGHT VENTRICLE             IVC RV Basal diam:  3.90 cm     IVC diam: 1.70 cm RV S prime:     12.40 cm/s TAPSE (M-mode): 1.6 cm LEFT ATRIUM              Index        RIGHT ATRIUM           Index LA diam:  5.20 cm  2.78 cm/m   RA Area:     16.70 cm LA Vol (A2C):   133.0 ml 71.23 ml/m  RA Volume:   44.10 ml  23.62 ml/m LA Vol (A4C):   102.0 ml 54.63 ml/m LA Biplane Vol: 122.0 ml 65.34 ml/m  AORTIC VALVE AV Area (Vmax):    1.22 cm AV Area (Vmean):   1.18 cm AV Area (VTI):     1.74 cm AV Vmax:           129.67 cm/s AV Vmean:          90.833 cm/s AV VTI:            0.154 m AV Peak Grad:      6.7 mmHg AV Mean Grad:      3.7 mmHg LVOT Vmax:         55.70 cm/s LVOT Vmean:         37.900 cm/s LVOT VTI:          0.094 m LVOT/AV VTI ratio: 0.61  AORTA Ao Root diam: 2.70 cm MITRAL VALVE MV Area (PHT): 9.10 cm    SHUNTS MV Decel Time: 83 msec     Systemic VTI:  0.09 m MV E velocity: 70.77 cm/s  Systemic Diam: 1.90 cm Kardie Tobb DO Electronically signed by Jerryl Morin DO Signature Date/Time: 05/26/2023/2:19:32 PM    Final    DG CHEST PORT 1 VIEW Result Date: 05/26/2023 CLINICAL DATA:  Central line placement. EXAM: PORTABLE CHEST 1 VIEW COMPARISON:  Same day. FINDINGS: Interval placement of left internal jugular catheter with distal tip in expected position of right atrium. Endotracheal and nasogastric tubes are again noted. No pneumothorax is noted. IMPRESSION: Interval placement of left internal jugular catheter with distal tip in expected position of right atrium. Electronically Signed   By: Rosalene Colon M.D.   On: 05/26/2023 12:04   Portable Chest x-ray Result Date: 05/26/2023 CLINICAL DATA:  Status post intubation. EXAM: PORTABLE CHEST 1 VIEW COMPARISON:  05/25/23 FINDINGS: The ETT tip is 9 mm above the carina. Enteric tube tip is in the gastric fundus. The side port is at the level of the GE junction. Asymmetric elevation of left hemidiaphragm. Pulmonary vascular congestion. Decreased aeration to the left base which may reflect atelectasis or airspace disease. IMPRESSION: 1. ETT tip is 9 mm above the carina. 2. Enteric tube tip is in the gastric fundus. The side port is at the level of the GE junction. Consider further advancing of the enteric tube. 3. Pulmonary vascular congestion. 4. Decreased aeration to the left base which may reflect atelectasis or airspace disease. Electronically Signed   By: Kimberley Penman M.D.   On: 05/26/2023 06:50   MR Brain Wo Contrast (neuro protocol) Result Date: 05/25/2023 CLINICAL DATA:  Provided history: Neuro deficit, acute, stroke suspected. Patient found minimally responsive on floor at home. EXAM: MRI HEAD WITHOUT CONTRAST TECHNIQUE: Multiplanar,  multiecho pulse sequences of the brain and surrounding structures were obtained without intravenous contrast. COMPARISON:  Non-contrast head CT and CT angiogram head/neck 05/25/2023. FINDINGS: Intermittently motion degraded examination (with up to moderate motion degradation of the heart sequences). Brain: No age-advanced or lobar predominant cerebral atrophy. Multifocal T2 FLAIR hyperintense signal abnormality within the cerebral white matter, nonspecific but compatible with mild chronic small vessel ischemic disease. No cortical encephalomalacia is identified. There is no acute infarct. No evidence of an intracranial mass. No chronic intracranial blood products. No extra-axial fluid collection. No midline shift. Vascular:  Maintained flow voids within the proximal large arterial vessels. Skull and upper cervical spine: No focal worrisome marrow lesion. Sinuses/Orbits: No mass or acute finding within the imaged orbits. Moderate mucosal thickening within the bilateral maxillary, right sphenoid, bilateral ethmoid and bilateral frontal sinuses. IMPRESSION: 1. Intermittently motion degraded examination. 2. No evidence of an acute intracranial abnormality. The diffusion-weighted imaging is of good quality and there is no evidence of an acute infarct. 3. Mild cerebral white matter chronic small vessel ischemic disease. 4. Paranasal sinus mucosal thickening as described. Electronically Signed   By: Bascom Lily D.O.   On: 05/25/2023 16:42   DG Chest Port 1 View Result Date: 05/25/2023 CLINICAL DATA:  Altered mental status. EXAM: PORTABLE CHEST 1 VIEW COMPARISON:  July 26, 2012. FINDINGS: Mild cardiomegaly is noted. Possible mild central pulmonary vascular congestion is noted. No consolidative process is noted. Bony thorax is unremarkable. IMPRESSION: Mild cardiomegaly with possible mild central pulmonary vascular congestion. Electronically Signed   By: Rosalene Colon M.D.   On: 05/25/2023 15:54   CT C-SPINE NO  CHARGE Result Date: 05/25/2023 CLINICAL DATA:  Syncope EXAM: CT CERVICAL SPINE WITHOUT CONTRAST TECHNIQUE: Multidetector CT imaging of the cervical spine was performed without intravenous contrast. Multiplanar CT image reconstructions were also generated. RADIATION DOSE REDUCTION: This exam was performed according to the departmental dose-optimization program which includes automated exposure control, adjustment of the mA and/or kV according to patient size and/or use of iterative reconstruction technique. COMPARISON:  Same day CT head. FINDINGS: Alignment: Alignment is maintained. No listhesis. No facet subluxation or dislocation. Skull base and vertebrae: No compression fracture or displaced fracture in the cervical spine. No suspicious osseous lesion. Soft tissues and spinal canal: No prevertebral fluid or swelling. No visible canal hematoma. Disc levels: Disc space narrowing at C5-6 and C6-7. Disc osteophyte complexes at C5-6 and C6-7 resulting in mild spinal canal stenosis. Facet arthrosis at multiple levels. Foraminal narrowing is most pronounced on the left at C5-6 and C6-7. Upper chest: Negative. Other: None. IMPRESSION: No acute fracture or traumatic malalignment of the cervical spine. Electronically Signed   By: Denny Flack M.D.   On: 05/25/2023 14:16   CT ANGIO HEAD NECK W WO CM W PERF (CODE STROKE) Result Date: 05/25/2023 CLINICAL DATA:  Neuro deficit, concern for stroke, syncope. EXAM: CT ANGIOGRAPHY HEAD AND NECK CT PERFUSION BRAIN TECHNIQUE: Multidetector CT imaging of the head and neck was performed using the standard protocol during bolus administration of intravenous contrast. Multiplanar CT image reconstructions and MIPs were obtained to evaluate the vascular anatomy. Carotid stenosis measurements (when applicable) are obtained utilizing NASCET criteria, using the distal internal carotid diameter as the denominator. Multiphase CT imaging of the brain was performed following IV bolus contrast  injection. Subsequent parametric perfusion maps were calculated using RAPID software. RADIATION DOSE REDUCTION: This exam was performed according to the departmental dose-optimization program which includes automated exposure control, adjustment of the mA and/or kV according to patient size and/or use of iterative reconstruction technique. CONTRAST:  100mL OMNIPAQUE IOHEXOL 350 MG/ML SOLN COMPARISON:  Same-day head CT. FINDINGS: CT Brain Perfusion Findings: ASPECTS: 10 CBF (<30%) Volume: 0mL Perfusion (Tmax>6.0s) volume: 6mL Mismatch Volume: 6mL Infarction Location:Infinite CTA NECK FINDINGS Aortic arch: Standard configuration of the aortic arch. Imaged portion shows no evidence of aneurysm or dissection. Mild atherosclerosis of the aortic arch. No significant stenosis of the major arch vessel origins. Pulmonary arteries: As permitted by contrast timing, there are no filling defects in the visualized pulmonary arteries.  Subclavian arteries: The subclavian arteries are patent bilaterally. Right carotid system: Patent. Mild atherosclerosis at the carotid bifurcation without hemodynamically significant stenosis. No evidence of dissection. Left carotid system: No evidence of dissection, stenosis (50% or greater), or occlusion. Vertebral arteries: Codominant. No evidence of dissection, stenosis (50% or greater), or occlusion. Skeleton: No acute findings. Degenerative changes in the cervical spine. Other neck: Redemonstrated prominence of the bilateral optic nerves and possible tortuosity of the optic nerves. The visualized airway is patent. No cervical lymphadenopathy. Upper chest: Visualized lung apices are clear. Review of the MIP images confirms the above findings CTA HEAD FINDINGS ANTERIOR CIRCULATION: The intracranial ICAs are patent bilaterally. Atherosclerosis of the carotid siphons resulting in mild stenosis of the bilateral cavernous ICAs. No high-grade stenosis, proximal occlusion, aneurysm, or vascular  malformation. MCAs: The middle cerebral arteries are patent bilaterally. ACAs: The anterior cerebral arteries are patent bilaterally. POSTERIOR CIRCULATION: No significant stenosis, proximal occlusion, aneurysm, or vascular malformation. PCAs: The posterior cerebral arteries are patent bilaterally. Pcomm: The posterior communicating arteries are visualized bilaterally. SCAs: The superior cerebellar arteries are patent bilaterally. Basilar artery: Patent AICAs: Patent PICAs: Patent Vertebral arteries: The intracranial vertebral arteries are patent. Venous sinuses: As permitted by contrast timing, patent. Anatomic variants: None Review of the MIP images confirms the above findings IMPRESSION: No large vessel occlusion. No core infarct identified on CT perfusion. Small focus of elevated T-max in left frontal lobe is likely artifactual. No high-grade stenosis of the carotid and vertebral arteries in the neck. Atherosclerosis resulting in mild stenosis of the bilateral cavernous ICAs. Otherwise no stenosis of the intracranial arterial vasculature. Prominence of the bilateral optic nerves. Consider correlation with fundoscopic exam. Aortic Atherosclerosis (ICD10-I70.0). These results were called by telephone at the time of interpretation on 05/25/2023 at 11:26 am to provider Dr. Bonnita Buttner, Who verbally acknowledged these results. Electronically Signed   By: Denny Flack M.D.   On: 05/25/2023 11:41   CT HEAD CODE STROKE WO CONTRAST Result Date: 05/25/2023 CLINICAL DATA:  Code stroke.  Neuro deficit, syncope. EXAM: CT HEAD WITHOUT CONTRAST TECHNIQUE: Contiguous axial images were obtained from the base of the skull through the vertex without intravenous contrast. RADIATION DOSE REDUCTION: This exam was performed according to the departmental dose-optimization program which includes automated exposure control, adjustment of the mA and/or kV according to patient size and/or use of iterative reconstruction technique. COMPARISON:   None Available. FINDINGS: Brain: No acute intracranial hemorrhage. No CT evidence of acute infarct. Nonspecific hypoattenuation in the periventricular and subcortical white matter favored to reflect chronic microvascular ischemic changes. No edema, mass effect, or midline shift. The basilar cisterns are patent. Ventricles: The ventricles are normal. Vascular: Atherosclerotic calcifications of the carotid siphons. No hyperdense vessel. Skull: No acute or aggressive finding. Orbits: Orbits are symmetric. There is suggestion of prominence of the bilateral optic nerves. Sinuses: Mucosal thickening throughout the visualized paranasal sinuses. No air-fluid levels. Other: Mastoid air cells are clear. ASPECTS [facility] Stroke Program Early CT Score) - Ganglionic level infarction (caudate, lentiform nuclei, internal capsule, insula, M1-M3 cortex): 7 - Supraganglionic infarction (M4-M6 cortex): 3 Total score (0-10 with 10 being normal): 10 IMPRESSION: 1. No CT evidence of acute intracranial abnormality. 2. ASPECTS is 10 These results were communicated to Dr. Wilhelmenia Harada At 11:16 am on 05/25/2023 by telephone call. Electronically Signed   By: Denny Flack M.D.   On: 05/25/2023 11:21     I have personally spent 85 minutes involved in face-to-face and non-face-to-face activities for this patient on  the day of the visit. Professional time spent includes the following activities: Preparing to see the patient (review of tests), Obtaining and/or reviewing separately obtained history (admission/discharge record), Performing a medically appropriate examination and/or evaluation , Ordering medications/tests/procedures, referring and communicating with other health care professionals, Documenting clinical information in the EMR, Independently interpreting results (not separately reported), Communicating results to the patient/family/caregiver, Counseling and educating the patient/family/caregiver and Care coordination (not separately  reported).  Electronically signed by:   Plan d/w requesting provider as well as ID pharm D  Of note, portions of this note may have been created with voice recognition software. While this note has been edited for accuracy, occasional wrong-word or 'sound-a-like' substitutions may have occurred due to the inherent limitations of voice recognition software.   Terre Ferri, MD Infectious Disease Physician [facility] Pager: [phone]

## 2023-05-29 ENCOUNTER — Inpatient Hospital Stay (HOSPITAL_COMMUNITY)

## 2023-05-29 DIAGNOSIS — G9340 Encephalopathy, unspecified: Secondary | ICD-10-CM | POA: Diagnosis not present

## 2023-05-29 DIAGNOSIS — R509 Fever, unspecified: Secondary | ICD-10-CM | POA: Diagnosis not present

## 2023-05-29 LAB — BASIC METABOLIC PANEL WITH GFR
Anion gap: 8 (ref 5–15)
BUN: 27 mg/dL — ABNORMAL HIGH (ref 8–23)
CO2: 26 mmol/L (ref 22–32)
Calcium: 7.8 mg/dL — ABNORMAL LOW (ref 8.9–10.3)
Chloride: 104 mmol/L (ref 98–111)
Creatinine, Ser: 0.69 mg/dL (ref 0.44–1.00)
GFR, Estimated: >60 mL/min (ref >60)
Glucose, Bld: 116 mg/dL — ABNORMAL HIGH (ref 70–99)
Potassium: 3.5 mmol/L (ref 3.5–5.1)
Sodium: 138 mmol/L (ref 135–145)

## 2023-05-29 LAB — CBC
HCT: 31 % — ABNORMAL LOW (ref 36.0–46.0)
Hemoglobin: 10.2 g/dL — ABNORMAL LOW (ref 12.0–15.0)
MCH: 28.3 pg (ref 26.0–34.0)
MCHC: 32.9 g/dL (ref 30.0–36.0)
MCV: 86.1 fL (ref 80.0–100.0)
Platelets: 137 10*3/uL — ABNORMAL LOW (ref 150–400)
RBC: 3.6 MIL/uL — ABNORMAL LOW (ref 3.87–5.11)
RDW: 17.1 % — ABNORMAL HIGH (ref 11.5–15.5)
WBC: 12.3 10*3/uL — ABNORMAL HIGH (ref 4.0–10.5)
nRBC: 0.7 % — ABNORMAL HIGH (ref 0.0–0.2)

## 2023-05-29 LAB — HEPARIN LEVEL (UNFRACTIONATED)
Heparin Unfractionated: <0.1 [IU]/mL — ABNORMAL LOW (ref 0.30–0.70)
Heparin Unfractionated: 0.14 [IU]/mL — ABNORMAL LOW (ref 0.30–0.70)
Heparin Unfractionated: 0.28 [IU]/mL — ABNORMAL LOW (ref 0.30–0.70)

## 2023-05-29 LAB — GLUCOSE, CAPILLARY
Glucose-Capillary: 106 mg/dL — ABNORMAL HIGH (ref 70–99)
Glucose-Capillary: 113 mg/dL — ABNORMAL HIGH (ref 70–99)
Glucose-Capillary: 88 mg/dL (ref 70–99)
Glucose-Capillary: 95 mg/dL (ref 70–99)
Glucose-Capillary: 97 mg/dL (ref 70–99)
Glucose-Capillary: 99 mg/dL (ref 70–99)

## 2023-05-29 LAB — PHOSPHORUS: Phosphorus: 2.3 mg/dL — ABNORMAL LOW (ref 2.5–4.6)

## 2023-05-29 LAB — MAGNESIUM: Magnesium: 2.4 mg/dL (ref 1.7–2.4)

## 2023-05-29 MED ORDER — FUROSEMIDE 10 MG/ML IJ SOLN
40.0000 mg | Freq: Once | INTRAMUSCULAR | Status: AC
  Administered 2023-05-29: 40 mg via INTRAVENOUS
  Filled 2023-05-29: qty 4

## 2023-05-29 MED ORDER — POTASSIUM & SODIUM PHOSPHATES 280-160-250 MG PO PACK
2.0000 | PACK | ORAL | Status: AC
  Administered 2023-05-29 (×3): 2
  Filled 2023-05-29 (×3): qty 2

## 2023-05-29 NOTE — Progress Notes (Signed)
 PHARMACY - ANTICOAGULATION CONSULT NOTE  Pharmacy Consult for heparin Indication: atrial fibrillation  Allergies  Allergen Reactions   Zithromax [Azithromycin] Itching    Patient Measurements: Height: 5\' 1"  (154.9 cm) Weight: 93 kg (205 lb 0.4 oz) IBW/kg (Calculated) : 47.8 HEPARIN DW (KG): 68.3  Vital Signs: Temp: 98.6 F (37 C) (04/13 1129) Temp Source: Oral (04/13 1129) Pulse Rate: 72 (04/13 1300)  Labs: Recent Labs    05/27/23 0409 05/27/23 0412 05/27/23 0715 05/27/23 0914 05/28/23 0211 05/28/23 0421 05/28/23 2231 05/29/23 0240 05/29/23 1212  HGB 12.4 13.3  --   --   --  10.9*  --  10.2*  --   HCT 37.4 39.0  --   --   --  32.8*  --  31.0*  --   PLT 152  --   --   --   --  121*  --  137*  --   HEPARINUNFRC  --   --   --   --  <0.10*  --   --  <0.10* 0.14*  CREATININE 1.06*  --   --   --   --  1.02* 0.74 0.69  --   TROPONINIHS  --   --  92* 81*  --   --   --   --   --     Estimated Creatinine Clearance: 65.2 mL/min (by C-G formula based on SCr of 0.69 mg/dL).   Medical History: Past Medical History:  Diagnosis Date   Asthma    Hypertension     Medications:  Scheduled:   arformoterol  15 mcg Nebulization BID   budesonide (PULMICORT) nebulizer solution  0.25 mg Nebulization BID   Chlorhexidine Gluconate Cloth  6 each Topical Daily   famotidine  20 mg Per Tube BID   feeding supplement (PROSource TF20)  60 mL Per Tube BID   insulin aspart  0-9 Units Subcutaneous Q4H   mouth rinse  15 mL Mouth Rinse Q2H   polyethylene glycol  17 g Per Tube Daily   potassium & sodium phosphates  2 packet Per Tube Q4H   thiamine (VITAMIN B1) injection  100 mg Intravenous Daily    Assessment: 72 yom presenting with AMS. No AC PTA. Found to be in Afib this admission, heparin per pharmacy ordered.  Heparin level remains low but now trending up, avoiding bolus with recent LP. No bleeding issues.   Goal of Therapy:  Heparin level 0.3-0.7 units/ml Monitor platelets by  anticoagulation protocol: Yes   Plan:  Increase heparin gtt to 1600 units/h  Check heparin level in 8 hours    Levin Reamer, PharmD, [place], [facility] Clinical Pharmacist [phone] Please check AMION for all [facility] Pharmacy numbers 05/29/2023

## 2023-05-29 NOTE — Progress Notes (Signed)
 PHARMACY - ANTICOAGULATION CONSULT NOTE  Pharmacy Consult for heparin Indication: atrial fibrillation  Allergies  Allergen Reactions   Zithromax [Azithromycin] Itching    Patient Measurements: Height: 5\' 1"  (154.9 cm) Weight: 93 kg (205 lb 0.4 oz) IBW/kg (Calculated) : 47.8 HEPARIN DW (KG): 68.3  Vital Signs: Temp: 98.4 F (36.9 C) (04/12 2308) Temp Source: Esophageal (04/12 2308) Pulse Rate: 70 (04/13 0245)  Labs: Recent Labs    05/27/23 0409 05/27/23 0412 05/27/23 0715 05/27/23 0914 05/28/23 0211 05/28/23 0421 05/28/23 2231 05/29/23 0240  HGB 12.4 13.3  --   --   --  10.9*  --  10.2*  HCT 37.4 39.0  --   --   --  32.8*  --  31.0*  PLT 152  --   --   --   --  121*  --  137*  HEPARINUNFRC  --   --   --   --  <0.10*  --   --  <0.10*  CREATININE 1.06*  --   --   --   --  1.02* 0.74  --   TROPONINIHS  --   --  92* 81*  --   --   --   --     Estimated Creatinine Clearance: 65.2 mL/min (by C-G formula based on SCr of 0.74 mg/dL).   Medical History: Past Medical History:  Diagnosis Date   Asthma    Hypertension     Medications:  Scheduled:   arformoterol  15 mcg Nebulization BID   budesonide (PULMICORT) nebulizer solution  0.25 mg Nebulization BID   Chlorhexidine Gluconate Cloth  6 each Topical Daily   famotidine  20 mg Per Tube BID   feeding supplement (PROSource TF20)  60 mL Per Tube BID   insulin aspart  0-9 Units Subcutaneous Q4H   mouth rinse  15 mL Mouth Rinse Q2H   polyethylene glycol  17 g Per Tube Daily   sodium chloride HYPERTONIC  4 mL Nebulization BID   thiamine (VITAMIN B1) injection  100 mg Intravenous Daily    Assessment: 72 yom presenting with AMS. No AC PTA. Found to be in Afib this admission, heparin per pharmacy ordered.  Heparin infusion held this am for LP, ok to resume with no bolus at 1830 per CCM.  AM: heparin level undetectable on 1200 units/hr. Per RN, no signs/symptoms of bleeding or issues with the heparin running  continuously. CBC low, stable.   Goal of Therapy:  Heparin level 0.3-0.7 units/ml Monitor platelets by anticoagulation protocol: Yes   Plan:  Increase heparin gtt to 1400 units/h  Check heparin level in 8 hours CBC, heparin level daily  Young Hensen, PharmD. Clinical Pharmacist 05/29/2023 3:21 AM

## 2023-05-29 NOTE — Progress Notes (Signed)
 NEUROLOGY CONSULT FOLLOW UP NOTE   Date of service: May 29, 2023 Patient Name: Piera Downs MRN:  [id number] DOB:  1950/09/02  Interval Hx/subjective   No significant change  Vitals   Vitals:   05/29/23 0645 05/29/23 0800 05/29/23 0900 05/29/23 1000  BP:      Pulse: 76 88 73 81  Resp: (!) 24 19 (!) 21 (!) 26  Temp:      TempSrc:      SpO2: 99% 94% 99% 99%  Weight:      Height:         Body mass index is 38.74 kg/m.  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, blinks to eyelid stimulation bilaterally Motor: Withdraws to stimulation bilaterally Sensory: As above  Medications  Current Facility-Administered Medications:    acetaminophen (TYLENOL) suppository 650 mg, 650 mg, Rectal, Q8H PRN, Sheree Dieter, MD, 650 mg at 05/26/23 0231   acyclovir (ZOVIRAX) 700 mg in dextrose 5 % 100 mL IVPB, 700 mg, Intravenous, Q8H, Bitonti, Michael T, RPH, Paused at 05/29/23 0601   ampicillin (OMNIPEN) 2 g in sodium chloride 0.9 % 100 mL IVPB, 2 g, Intravenous, Q4H, Bitonti, Michael T, RPH, Stopped at 05/29/23 0919   arformoterol (BROVANA) nebulizer solution 15 mcg, 15 mcg, Nebulization, BID, Joesph Mussel, DO, 15 mcg at 05/29/23 0747   budesonide (PULMICORT) nebulizer solution 0.25 mg, 0.25 mg, Nebulization, BID, Joesph Mussel, DO, 0.25 mg at 05/29/23 0747   cefTRIAXone (ROCEPHIN) 2 g in sodium chloride 0.9 % 100 mL IVPB, 2 g, Intravenous, Q12H, Jackolyn Masker, MD, Last Rate: 200 mL/hr at 05/29/23 0812, 2 g at 05/29/23 8657   Chlorhexidine Gluconate Cloth 2 % PADS 6 each, 6 each, Topical, Daily, Bevelyn Bryant, MD, 6 each at 05/29/23 1110   docusate (COLACE) 50 MG/5ML liquid 100 mg, 100 mg, Per Tube, BID PRN, Alva, Rakesh V, MD   famotidine (PEPCID) tablet 20 mg, 20 mg, Per Tube, BID,  Alva, Rakesh V, MD, 20 mg at 05/29/23 1108   feeding supplement (PROSource TF20) liquid 60 mL, 60 mL, Per Tube, BID, Clark, Laura P, DO, 60 mL at 05/29/23 1108   feeding supplement (VITAL 1.5 CAL) liquid 1,000 mL, 1,000 mL, Per Tube, Continuous, Joesph Mussel, DO, Last Rate: 45 mL/hr at 05/29/23 1000, Infusion Verify at 05/29/23 1000   fentaNYL (SUBLIMAZE) bolus via infusion 25-100 mcg, 25-100 mcg, Intravenous, Q30 min PRN, Bitonti, Michael T, RPH, 100 mcg at 05/27/23 1320   fentaNYL in NS (2mcg/ml) infusion-PREMIX, 0-400 mcg/hr, Intravenous, Continuous, Gleason, Patt Boozer, PA-C, Last Rate: 5 mL/hr at 05/29/23 1000, 50 mcg/hr at 05/29/23 1000   heparin ADULT infusion 100 units/mL (25000 units/250mL), 1,400 Units/hr, Intravenous, Continuous, Young Hensen, RPH, Last Rate: 14 mL/hr at 05/29/23 1000, 1,400 Units/hr at 05/29/23 1000   insulin aspart (novoLOG) injection 0-9 Units, 0-9 Units, Subcutaneous, Q4H, Joesph Mussel, DO, 1 Units at 05/27/23 2340   lactated ringers infusion, , Intravenous, Continuous, Gleason, Patt Boozer, PA-C, Last Rate: 50 mL/hr at 05/29/23 1000, Infusion Verify at 05/29/23 1000   metoprolol tartrate (LOPRESSOR) injection 2.5-5 mg, 2.5-5 mg, Intravenous, Q3H PRN, Alva, Rakesh V, MD, 5 mg at 05/26/23 0721   norepinephrine (LEVOPHED) 4mg  in (0.016 mg/mL) premix infusion, 0-40 mcg/min, Intravenous, Titrated, Gleason, Patt Boozer,  PA-C, Stopped at 05/28/23 1900   Oral care mouth rinse, 15 mL, Mouth Rinse, Q2H, Celene Coins V, MD, 15 mL at 05/29/23 1109   Oral care mouth rinse, 15 mL, Mouth Rinse, PRN, Alva, Rakesh V, MD   polyethylene glycol (MIRALAX / GLYCOLAX) packet 17 g, 17 g, Per Tube, Daily, Alva, Rakesh V, MD, 17 g at 05/29/23 1108   potassium & sodium phosphates (PHOS-NAK) 280-160-250 MG packet 2 packet, 2 packet, Per Tube, Q4H, Paliwal, Aditya, MD, 2 packet at 05/29/23 1110   propofol (DIPRIVAN) 1000 MG/100ML infusion, 0-50 mcg/kg/min, Intravenous, Continuous,  Celene Coins V, MD, Last Rate: 5.3 mL/hr at 05/29/23 1000, 10 mcg/kg/min at 05/29/23 1000   thiamine (VITAMIN B1) injection 100 mg, 100 mg, Intravenous, Daily, Gleason, Laura R, PA-C, 100 mg at 05/29/23 1108   vancomycin (VANCOREADY) IVPB 750 mg/150 mL, 750 mg, Intravenous, Q12H, Ozie Bo, RPH, Stopped at 05/29/23 0910  Labs and Diagnostic Imaging  Her initial procalcitonin was 94  Imaging(Personally reviewed): MRI of the brain is negative CT angio without LVO  Assessment   Avalyn Molino is a 73 y.o. female with acute encephalopathy of unclear origin.  Her mental status worsened overnight, and she was intubated.  She has been started on broad-spectrum meningitic coverage and given that she is febrile without source, and densely encephalopathic, I agree with having done this.    She does feel like she has some degree of meningismus, so I do continue to have high concern for CNS infection.  No evidence of ongoing seizure activity.  Her B12 is low, though I do not think it was the primary etiology of her presentation, I certainly think it should be repleted and therefore I have started her on B12 shots.  It appears that she may have osteomyelitis, and it is possible especially in the setting of her low B12, that this represents delirium in the setting of other infection, but especially given neck stiffness that I have appreciated I would favor ruling out meningitis if possible, or empiric treatment if not.  We could check serum HSV antibodies and stop acyclovir if negative, my suspicion for viral infection is actually fairly low.  LP was unable to be obtained at the bedside, and therefore multiple providers tried under fluoroscopy all of which were unable to be done.  After MRI of the L-spine, it was reattempted with a C arm, and bloody fluid was obtained and sent to the lab where it was found to be completely clotted and therefore felt to be blood not bloody CSF.  She is not making  any progress, she had an overnight cerebellum and negative 20-minute EEG, but has not had an overnight 23-lead EEG.  I think it may be useful to completely exclude intermittent seizures with overnight EEG, but if this is negative then I think the neck step would simply be giving her time to awaken.  With such a high procalcitonin, I think the differential remains meningitis versus septic encephalopathy.  Recommendations  I would treat empirically for bacterial meningitis Would consider stopping acyclovir if serum HSV antibodies are negative Overnight EEG Appreciate ID involvement Neurology will continue to follow ______________________________________________________________________  Ann Keto, MD [facility]   If 7pm- 7am, please page neurology on call as listed in AMION. 05/29/2023  11:11 AM

## 2023-05-29 NOTE — Progress Notes (Signed)
 LTM EEG hooked up and running - no initial skin breakdown - push button tested - [facility] monitoring.

## 2023-05-29 NOTE — Plan of Care (Signed)
  Problem: Clinical Measurements: Goal: Ability to maintain clinical measurements within normal limits will improve Outcome: Not Progressing Goal: Will remain free from infection Outcome: Not Progressing Goal: Diagnostic test results will improve Outcome: Not Progressing Goal: Cardiovascular complication will be avoided Outcome: Not Progressing   

## 2023-05-29 NOTE — Progress Notes (Addendum)
 ID brief note  Afebrile     Latest Ref Rng & Units 05/29/2023    2:40 AM 05/28/2023    4:21 AM 05/27/2023    4:12 AM  CBC  WBC 4.0 - 10.5 K/uL 12.3  21.5    Hemoglobin 12.0 - 15.0 g/dL 78.2  95.6  21.3   Hematocrit 36.0 - 46.0 % 31.0  32.8  39.0   Platelets 150 - 400 K/uL 137  121        Latest Ref Rng & Units 05/29/2023    2:40 AM 05/28/2023   10:31 PM 05/28/2023    4:21 AM  CMP  Glucose 70 - 99 mg/dL 086  578  469   BUN 8 - 23 mg/dL 27  27  28    Creatinine 0.44 - 1.00 mg/dL 6.29  5.28  4.13   Sodium 135 - 145 mmol/L 138  139  138   Potassium 3.5 - 5.1 mmol/L 3.5  3.7  3.5   Chloride 98 - 111 mmol/L 104  108  104   CO2 22 - 32 mmol/L 26  25  26    Calcium 8.9 - 10.3 mg/dL 7.8  7.7  7.7     Results for orders placed or performed during the hospital encounter of 05/25/23  Resp panel by RT-PCR (RSV, Flu A&B, Covid) Anterior Nasal Swab     Status: None   Collection Time: 05/25/23  5:50 PM   Specimen: Anterior Nasal Swab  Result Value Ref Range Status   SARS Coronavirus 2 by RT PCR NEGATIVE NEGATIVE Final   Influenza A by PCR NEGATIVE NEGATIVE Final   Influenza B by PCR NEGATIVE NEGATIVE Final    Comment: (NOTE) The Xpert Xpress SARS-CoV-2/FLU/RSV plus assay is intended as an aid in the diagnosis of influenza from Nasopharyngeal swab specimens and should not be used as a sole basis for treatment. Nasal washings and aspirates are unacceptable for Xpert Xpress SARS-CoV-2/FLU/RSV testing.  Fact Sheet for Patients: BloggerCourse.com  Fact Sheet for Healthcare Providers: SeriousBroker.it  This test is not yet approved or cleared by the [place]  FDA and has been authorized for detection and/or diagnosis of SARS-CoV-2 by FDA under an Emergency Use Authorization (EUA). This EUA will remain in effect (meaning this test can be used) for the duration of the COVID-19 declaration under Section 564(b)(1) of the Act, 21  U.S.C. section 360bbb-3(b)(1), unless the authorization is terminated or revoked.     Resp Syncytial Virus by PCR NEGATIVE NEGATIVE Final    Comment: (NOTE) Fact Sheet for Patients: BloggerCourse.com  Fact Sheet for Healthcare Providers: SeriousBroker.it  This test is not yet approved or cleared by the [place]  FDA and has been authorized for detection and/or diagnosis of SARS-CoV-2 by FDA under an Emergency Use Authorization (EUA). This EUA will remain in effect (meaning this test can be used) for the duration of the COVID-19 declaration under Section 564(b)(1) of the Act, 21 U.S.C. section 360bbb-3(b)(1), unless the authorization is terminated or revoked.  Performed at [facility], [address] [place], [place] [address]   Respiratory (~20 pathogens) panel by PCR     Status: Abnormal   Collection Time: 05/25/23  5:50 PM   Specimen: Anterior Nasal Swab; Respiratory  Result Value Ref Range Status   Adenovirus NOT DETECTED NOT DETECTED Final   Coronavirus 229E NOT DETECTED NOT DETECTED Final    Comment: (NOTE) The Coronavirus on the Respiratory Panel, DOES NOT test for the novel  Coronavirus (2019 nCoV)  Coronavirus HKU1 NOT DETECTED NOT DETECTED Final   Coronavirus NL63 NOT DETECTED NOT DETECTED Final   Coronavirus OC43 NOT DETECTED NOT DETECTED Final   Metapneumovirus NOT DETECTED NOT DETECTED Final   Rhinovirus / Enterovirus DETECTED (A) NOT DETECTED Final   Influenza A NOT DETECTED NOT DETECTED Final   Influenza B NOT DETECTED NOT DETECTED Final   Parainfluenza Virus 1 NOT DETECTED NOT DETECTED Final   Parainfluenza Virus 2 NOT DETECTED NOT DETECTED Final   Parainfluenza Virus 3 NOT DETECTED NOT DETECTED Final   Parainfluenza Virus 4 NOT DETECTED NOT DETECTED Final   Respiratory Syncytial Virus NOT DETECTED NOT DETECTED Final   Bordetella pertussis NOT DETECTED NOT DETECTED Final   Bordetella  Parapertussis NOT DETECTED NOT DETECTED Final   Chlamydophila pneumoniae NOT DETECTED NOT DETECTED Final   Mycoplasma pneumoniae NOT DETECTED NOT DETECTED Final    Comment: Performed at [facility], [address] [place], [place] [address]  MRSA Next Gen by PCR, Nasal     Status: None   Collection Time: 05/26/23  3:20 AM   Specimen: Nasal Mucosa; Nasal Swab  Result Value Ref Range Status   MRSA by PCR Next Gen NOT DETECTED NOT DETECTED Final    Comment: (NOTE) The GeneXpert MRSA Assay (FDA approved for NASAL specimens only), is one component of a comprehensive MRSA colonization surveillance program. It is not intended to diagnose MRSA infection nor to guide or monitor treatment for MRSA infections. Test performance is not FDA approved in patients less than 38 years old. Performed at [facility], [address], [place], [place] [address]   Culture, blood (Routine X 2) w Reflex to ID Panel     Status: None (Preliminary result)   Collection Time: 05/26/23  5:46 AM   Specimen: BLOOD LEFT HAND  Result Value Ref Range Status   Specimen Description BLOOD LEFT HAND  Final   Special Requests   Final    BOTTLES DRAWN AEROBIC AND ANAEROBIC Blood Culture adequate volume   Culture   Final    NO GROWTH 3 DAYS Performed at [facility], [address] [place], [place] [address]    Report Status PENDING  Incomplete  Culture, blood (Routine X 2) w Reflex to ID Panel     Status: None (Preliminary result)   Collection Time: 05/26/23 10:06 AM   Specimen: BLOOD RIGHT HAND  Result Value Ref Range Status   Specimen Description BLOOD RIGHT HAND  Final   Special Requests   Final    AEROBIC BOTTLE ONLY Blood Culture results may not be optimal due to an inadequate volume of blood received in culture bottles   Culture   Final    NO GROWTH 3 DAYS Performed at [facility], [address], [place], [place] [address]    Report Status PENDING  Incomplete    S/p LP yesterday  by IR  7 mL of bloody fluid, likely reflecting traumatic tap. Unable to collect more than 7 mL volume. Unable to measure accurate CSF pressure.  CSF analysis, gram stain and cx, meningitis and encephalitis panel all pending at this time   Off vasopressors Remains on acyclovir, vancomycin, ceftriaxone, ampicillin for empiric bacterial plus viral meningitis coverage  Plan to de-escalate pending CSF findings as well as need for disc aspiration and biopsy Monitor CBC and CMP Dr Ernie Heal to follow starting 4/14  Addendum 4: 05pm Per Neurology, CSF sample clot and unable to run meningitis/encephalitis  studies. HSV serology has been sent and may help in determining further continuation of acyclovir. I have re consulted IR for lumbar disc aspiration and biopsy. Her encephalopathy seems to be profound to be caused by lumbar infection alone. May consider repeat MRI brain. D/w Neurology and PCCM.   Terre Ferri, MD Infectious Disease Physician [facility] [address] [place], [place] [address] Phone: [phone]  Fax: [phone]

## 2023-05-29 NOTE — Progress Notes (Signed)
 Called sister Bari Boos, no answer. Did not leave a message since it sounded like a work/ non-private answering machine.  Brother Aletta Andreas called to provide update. All questions answered. He understands our concern that she may not be able to recover, and if she does, it will be slow. This will be a life-altering experience for her. Prognosis is very uncertain at this time.  Moved to her housing about 2-3 months ago. Old house, he wonders if she has been exposed to mold. No one has lived in this house for 20+ years. She was cleaning up the house when this started.   Added on fungitell blood test.  Joesph Mussel, DO 05/29/23 5:28 PM [facility]

## 2023-05-29 NOTE — Progress Notes (Signed)
 NAME:  Morgan Morgan, MRN:  [id number], DOB:  1951/01/02, LOS: 4 ADMISSION DATE:  05/25/2023, CONSULTATION DATE:  05/29/2023  REFERRING MD:  Sonia Durand, CHIEF COMPLAINT: Altered mental status  History of Present Illness:  Patient is unresponsive, history obtained via chart review. 73 year old woman BIBEMS as a code stroke after being found down by her grandson.  Initial neuroexam she was obtunded with disconjugate gaze, grimaces to pain, withdrawing both lower extremities and localizing in uppers.  Head CT and CT angio was negative as well as UDS Initial labs significant for mild leukocytosis and thrombocytopenia with lactate of 3.3 and CK of 672 with hypokalemia.  Respiratory viral panel was positive for rhinovirus. She subsequently developed fever She was empirically treated with ceftriaxone, vancomycin and ampicillin with acyclovir Repeat labs showed lactate rising to 4.2, PCCM consulted due to concern for ability to maintain airway EKG showed atrial fibrillation MRI brain was negative  Pertinent  Medical History  Asthma Hypertension  Significant Hospital Events: Including procedures, antibiotic start and stop dates in addition to other pertinent events   4/9 admitted with AMS, worsened overnight requiring intubation and ICU txfr 4/11 remains intubated with mental status precluding extubation, plan for LP with IR today  Interim History / Subjective:  No new findings. Coughs and gags frequently when not on sedation, but not waking up or following commands.  LP was too bloody to process.   Objective   Blood pressure (!) 113/57, pulse 69, temperature 98.6 F (37 C), temperature source Oral, resp. rate 20, height 5\' 1"  (1.549 m), weight 93 kg, SpO2 98%. CVP:  [3 mmHg-11 mmHg] 6 mmHg  Vent Mode: PSV;CPAP FiO2 (%):  [40 %] 40 % Set Rate:  [20 bmp] 20 bmp Vt Set:  [380 mL] 380 mL PEEP:  [5 cmH20] 5 cmH20 Pressure Support:  [5 cmH20] 5 cmH20 Plateau Pressure:  [14 cmH20] 14 cmH20    Intake/Output Summary (Last 24 hours) at 05/29/2023 1501 Last data filed at 05/29/2023 1400 Gross per 24 hour  Intake 3593.57 ml  Output 1350 ml  Net 2243.57 ml   Filed Weights   05/27/23 0515 05/28/23 0425 05/29/23 0244  Weight: 92.3 kg 93 kg 93 kg     General: critically ill appearing woman lying in bed in NAD HEENT: [place]/A, eyes anicteric Neuro: examined on propofol and fentanyl; bites on tube, moderate cough reflex. Not opening eyes or following commands. Dysconjugate gaze-- left eye slightly outward.  CV: S1S2, RRR PULM:  breathing comfortably on MV, clear ETT secretions GI: obese, soft, NT Extremities: no significant edema, no cyanosis Skin: warm, dry, no rashes  Labs: BUN 27 Cr 0.69 WBC 12.3 H/H 10.2/31 Platelets 137  Micro: 4/9 Resp pathogen panel +rhinovirus/enterovirus 4/10 Bcx2> 4/9 Covid/flu neg  Abx: Acyclovir 4/9- Ampicillin 4/9 Ceftriaxone 4/9- Vancomycin 4/9-  Tubes/lines/drains  4/9 ETT 4/10 L rad art line, L internal jugular CVC 4/11 foley   Resolved Hospital Problem list   Hypokalemia Hypomagnesemia    Assessment & Plan:   Acute encephalopathy- septic vs due to CNS infection. Low suspicion for drugs or meds. No obvious metabolic causes.  TSH & ammonia WNL. Unable to get CSF after 4 providers have attempted.   -appreciate neurology's management-- strong suspicion for CNS infection despite inability to confirm this at present -check serum HSV IgG, IgM (IgM is a [facility] send-out) -repeat 24h EEG to r/o seizures, although we have not seen seizure activity to suggest this -PAD protocol -Empiric meningitis antibiotics, which should cover almost  everything else. Would avoid cefepime due to encephalopathy risk. -Overall very worried about unknown source of encephalopathy and being 5 days and with no significant improvement.  Palliative care consulted due to uncertain path forward without a clear diagnosis to help prognosticate.  Septic shock   -con't antibiotics -vasopressors as required to maintain MAP >65 -ID requesting IR do disc biopsy to r/o infection; this does not seem like a significant enough infection alone to present with such dramatic encephalopathy -stop LR  Acute hypoxic respiratory failure due to encephalopathy requiring MV -Low tidal volume ventilation - VAP prevention protocol - PAD protocol for sedation - Daily SAT and SBT as appropriate.  Currently mental status precludes extubation unless for palliative intent.  Rhinovirus infection -Supportive care with droplet precautions  Atrial fibrillation, new onset HFrEF, EF 20-25%> possibly due to sepsis Type II MI/ trop leak of critical illness Family reports that she has had a "mild heart attack" in the past - Continue amiodarone - Continue heparin, pharmacy to dose - Eventually needs GDMT when more stable.  AKI, improved - Strict I's/O - Renally dose meds and avoid nephrotoxic meds - Continue to monitor renal function and urine output -1 dose of Lasix today due to net positive volume status  Acute anemia, likely dilutional Thrombocytopenia due to sepsis> improving -Continue to monitor, no current indications for transfusion.   No family at bedside today. Yesterday her sister was updated. [name] lives with her 21 year old grandson, who is with other nearby family members currently.  Family wants all aggressive care measures done.   Best Practice (right click and "Reselect all SmartList Selections" daily)   Diet/type: TF DVT prophylaxis: heparin Pressure ulcer(s): N/A GI prophylaxis: Pepcid Lines: Central line Foley:  yes and still needed  Code Status:  full code Last date of multidisciplinary goals of care discussion [4/12- sister ]  Labs   CBC: Recent Labs  Lab 05/25/23 1224 05/25/23 2028 05/26/23 0545 05/26/23 0657 05/26/23 1006 05/27/23 0409 05/27/23 0412 05/28/23 0421 05/29/23 0240  WBC 15.6*   < > 21.7*  --  23.1* 24.4*  --   21.5* 12.3*  NEUTROABS 14.4*  --  19.1*  --   --   --   --   --   --   HGB 14.8   < > 15.0   < > 14.8 12.4 13.3 10.9* 10.2*  HCT 45.0   < > 44.1   < > 44.4 37.4 39.0 32.8* 31.0*  MCV 86.2   < > 83.4  --  84.4 85.2  --  85.4 86.1  PLT 143*   < > 197  --  178 152  --  121* 137*   < > = values in this interval not displayed.    Basic Metabolic Panel: Recent Labs  Lab 05/26/23 1006 05/26/23 2045 05/27/23 0409 05/27/23 0412 05/28/23 0421 05/28/23 2231 05/29/23 0240  NA 133* 138 138 137 138 139 138  K 4.1 3.4* 4.8 4.8 3.5 3.7 3.5  CL 99 105 107  --  104 108 104  CO2 20* 23 23  --  26 25 26   GLUCOSE 253* 107* 99  --  136* 105* 116*  BUN 16 21 24*  --  28* 27* 27*  CREATININE 1.19* 1.05* 1.06*  --  1.02* 0.74 0.69  CALCIUM 8.1* 7.8* 7.7*  --  7.7* 7.7* 7.8*  MG 1.6* 2.7* 2.6*  --  2.5*  --  2.4  PHOS  --   --  3.9  --  3.0  --  2.3*   GFR: Estimated Creatinine Clearance: 65.2 mL/min (by C-G formula based on SCr of 0.69 mg/dL). Recent Labs  Lab 05/26/23 0400 05/26/23 0545 05/26/23 0546 05/26/23 1006 05/26/23 1525 05/27/23 0409 05/28/23 0421 05/29/23 0240  PROCALCITON  --   --   --  94.45  --   --   --   --   WBC  --    < >  --  23.1*  --  24.4* 21.5* 12.3*  LATICACIDVEN 4.0*  --  3.2* 3.0* 1.6  --   --   --    < > = values in this interval not displayed.     This patient is critically ill with multiple organ system failure which requires frequent high complexity decision making, assessment, support, evaluation, and titration of therapies. This was completed through the application of advanced monitoring technologies and extensive interpretation of multiple databases. During this encounter critical care time was devoted to patient care services described in this note for 35 minutes.  Joesph Mussel, DO 05/29/23 3:24 PM [facility]  For contact information, see Amion. If no response to pager, please call PCCM consult pager. After hours, 7PM- 7AM,  please call Elink.

## 2023-05-29 NOTE — Progress Notes (Signed)
 PHARMACY - ANTICOAGULATION CONSULT NOTE  Pharmacy Consult for heparin Indication: atrial fibrillation  Allergies  Allergen Reactions   Zithromax [Azithromycin] Itching    Patient Measurements: Height: 5\' 1"  (154.9 cm) Weight: 93 kg (205 lb 0.4 oz) IBW/kg (Calculated) : 47.8 HEPARIN DW (KG): 68.3  Vital Signs: Temp: 96.6 F (35.9 C) (04/13 2115) Temp Source: Esophageal (04/13 1945) Pulse Rate: 72 (04/13 2115)  Labs: Recent Labs    05/27/23 0409 05/27/23 0412 05/27/23 0715 05/27/23 0914 05/28/23 0211 05/28/23 0421 05/28/23 2231 05/29/23 0240 05/29/23 1212 05/29/23 2132  HGB 12.4 13.3  --   --   --  10.9*  --  10.2*  --   --   HCT 37.4 39.0  --   --   --  32.8*  --  31.0*  --   --   PLT 152  --   --   --   --  121*  --  137*  --   --   HEPARINUNFRC  --   --   --   --    < >  --   --  <0.10* 0.14* 0.28*  CREATININE 1.06*  --   --   --   --  1.02* 0.74 0.69  --   --   TROPONINIHS  --   --  92* 81*  --   --   --   --   --   --    < > = values in this interval not displayed.    Estimated Creatinine Clearance: 65.2 mL/min (by C-G formula based on SCr of 0.69 mg/dL).   Medical History: Past Medical History:  Diagnosis Date   Asthma    Hypertension     Medications:  Scheduled:   arformoterol  15 mcg Nebulization BID   budesonide (PULMICORT) nebulizer solution  0.25 mg Nebulization BID   Chlorhexidine Gluconate Cloth  6 each Topical Daily   famotidine  20 mg Per Tube BID   feeding supplement (PROSource TF20)  60 mL Per Tube BID   insulin aspart  0-9 Units Subcutaneous Q4H   mouth rinse  15 mL Mouth Rinse Q2H   polyethylene glycol  17 g Per Tube Daily   thiamine (VITAMIN B1) injection  100 mg Intravenous Daily    Assessment: 72 yom presenting with AMS. No AC PTA. Found to be in Afib this admission, heparin per pharmacy ordered.  Heparin level trending up but still subtherapeutic on 1600 units/hr, avoiding bolus with recent LP. No bleeding issues per  RN.   Goal of Therapy:  Heparin level 0.3-0.7 units/ml Monitor platelets by anticoagulation protocol: Yes   Plan:  Increase heparin gtt to 1750 units/h  Check heparin level with AM labs CBC, heparin level daily  Young Hensen, PharmD. Clinical Pharmacist 05/29/2023 10:41 PM

## 2023-05-29 NOTE — Progress Notes (Signed)
 [facility] ADULT ICU REPLACEMENT PROTOCOL   The patient does apply for the [facility] Adult ICU Electrolyte Replacment Protocol based on the criteria listed below:   1.Exclusion criteria: TCTS, ECMO, Dialysis, and Myasthenia Gravis patients 2. Is GFR >/= 30 ml/min? Yes.    Patient's GFR today is >60 3. Is SCr </= 2? Yes.   Patient's SCr is 0.69 mg/dL 4. Did SCr increase >/= 0.5 in 24 hours? No. 5.Pt's weight >40kg  Yes.   6. Abnormal electrolyte(s): Phos 2.3, K+ 3.5  7. Electrolytes replaced per protocol 8.  Call MD STAT for K+ </= 2.5, Phos </= 1, or Mag </= 1 Physician:  Dr. Silva Drone, Doloris Freund 05/29/2023 5:55 AM

## 2023-05-30 ENCOUNTER — Inpatient Hospital Stay (HOSPITAL_COMMUNITY)

## 2023-05-30 DIAGNOSIS — D72829 Elevated white blood cell count, unspecified: Secondary | ICD-10-CM | POA: Diagnosis not present

## 2023-05-30 DIAGNOSIS — R569 Unspecified convulsions: Secondary | ICD-10-CM | POA: Diagnosis not present

## 2023-05-30 DIAGNOSIS — M4640 Discitis, unspecified, site unspecified: Secondary | ICD-10-CM

## 2023-05-30 DIAGNOSIS — I5020 Unspecified systolic (congestive) heart failure: Secondary | ICD-10-CM | POA: Diagnosis not present

## 2023-05-30 DIAGNOSIS — M4626 Osteomyelitis of vertebra, lumbar region: Secondary | ICD-10-CM | POA: Diagnosis not present

## 2023-05-30 DIAGNOSIS — M2569 Stiffness of other specified joint, not elsewhere classified: Secondary | ICD-10-CM

## 2023-05-30 DIAGNOSIS — G9340 Encephalopathy, unspecified: Secondary | ICD-10-CM | POA: Diagnosis not present

## 2023-05-30 DIAGNOSIS — M4646 Discitis, unspecified, lumbar region: Secondary | ICD-10-CM | POA: Diagnosis not present

## 2023-05-30 DIAGNOSIS — R509 Fever, unspecified: Secondary | ICD-10-CM | POA: Diagnosis not present

## 2023-05-30 DIAGNOSIS — I4891 Unspecified atrial fibrillation: Secondary | ICD-10-CM | POA: Diagnosis not present

## 2023-05-30 DIAGNOSIS — E538 Deficiency of other specified B group vitamins: Secondary | ICD-10-CM

## 2023-05-30 DIAGNOSIS — J9601 Acute respiratory failure with hypoxia: Secondary | ICD-10-CM | POA: Diagnosis not present

## 2023-05-30 LAB — BASIC METABOLIC PANEL WITH GFR
Anion gap: 7 (ref 5–15)
BUN: 23 mg/dL (ref 8–23)
CO2: 30 mmol/L (ref 22–32)
Calcium: 7.6 mg/dL — ABNORMAL LOW (ref 8.9–10.3)
Chloride: 105 mmol/L (ref 98–111)
Creatinine, Ser: 0.74 mg/dL (ref 0.44–1.00)
GFR, Estimated: >60 mL/min (ref >60)
Glucose, Bld: 110 mg/dL — ABNORMAL HIGH (ref 70–99)
Potassium: 3.7 mmol/L (ref 3.5–5.1)
Sodium: 142 mmol/L (ref 135–145)

## 2023-05-30 LAB — TRIGLYCERIDES: Triglycerides: 63 mg/dL (ref <150)

## 2023-05-30 LAB — CBC
HCT: 28.8 % — ABNORMAL LOW (ref 36.0–46.0)
Hemoglobin: 9.5 g/dL — ABNORMAL LOW (ref 12.0–15.0)
MCH: 28.7 pg (ref 26.0–34.0)
MCHC: 33 g/dL (ref 30.0–36.0)
MCV: 87 fL (ref 80.0–100.0)
Platelets: 164 10*3/uL (ref 150–400)
RBC: 3.31 MIL/uL — ABNORMAL LOW (ref 3.87–5.11)
RDW: 17.2 % — ABNORMAL HIGH (ref 11.5–15.5)
WBC: 11.3 10*3/uL — ABNORMAL HIGH (ref 4.0–10.5)
nRBC: 0.2 % (ref 0.0–0.2)

## 2023-05-30 LAB — MAGNESIUM: Magnesium: 2.2 mg/dL (ref 1.7–2.4)

## 2023-05-30 LAB — PROCALCITONIN: Procalcitonin: 4.73 ng/mL

## 2023-05-30 LAB — GLUCOSE, CAPILLARY
Glucose-Capillary: 116 mg/dL — ABNORMAL HIGH (ref 70–99)
Glucose-Capillary: 110 mg/dL — ABNORMAL HIGH (ref 70–99)
Glucose-Capillary: 131 mg/dL — ABNORMAL HIGH (ref 70–99)
Glucose-Capillary: 133 mg/dL — ABNORMAL HIGH (ref 70–99)
Glucose-Capillary: 75 mg/dL (ref 70–99)
Glucose-Capillary: 102 mg/dL — ABNORMAL HIGH (ref 70–99)

## 2023-05-30 LAB — PHOSPHORUS: Phosphorus: 3.7 mg/dL (ref 2.5–4.6)

## 2023-05-30 LAB — HEPARIN LEVEL (UNFRACTIONATED)
Heparin Unfractionated: 0.44 [IU]/mL (ref 0.30–0.70)
Heparin Unfractionated: 0.28 [IU]/mL — ABNORMAL LOW (ref 0.30–0.70)
Heparin Unfractionated: 0.63 [IU]/mL (ref 0.30–0.70)

## 2023-05-30 LAB — HSV 1 ANTIBODY, IGG: HSV 1 Glycoprotein G Ab, IgG: NONREACTIVE

## 2023-05-30 MED ORDER — MIDAZOLAM HCL 2 MG/2ML IJ SOLN
4.0000 mg | Freq: Once | INTRAMUSCULAR | Status: DC
  Filled 2023-05-30: qty 4

## 2023-05-30 MED ORDER — FUROSEMIDE 10 MG/ML IJ SOLN
40.0000 mg | Freq: Three times a day (TID) | INTRAMUSCULAR | Status: DC
  Administered 2023-05-30 – 2023-06-01 (×6): 40 mg via INTRAVENOUS
  Filled 2023-05-30 (×6): qty 4

## 2023-05-30 MED ORDER — POTASSIUM CHLORIDE 20 MEQ PO PACK
40.0000 meq | PACK | Freq: Once | ORAL | Status: AC
  Administered 2023-05-30: 40 meq
  Filled 2023-05-30: qty 2

## 2023-05-30 MED ORDER — LACTATED RINGERS IV SOLN: INTRAVENOUS | Status: DC

## 2023-05-30 MED ORDER — GADOBUTROL 1 MMOL/ML IV SOLN
9.0000 mL | Freq: Once | INTRAVENOUS | Status: AC | PRN
  Administered 2023-05-30: 9 mL via INTRAVENOUS

## 2023-05-30 MED ORDER — FENTANYL CITRATE PF 50 MCG/ML IJ SOSY
100.0000 ug | PREFILLED_SYRINGE | Freq: Once | INTRAMUSCULAR | Status: AC
  Administered 2023-05-30: 100 ug via INTRAVENOUS
  Filled 2023-05-30: qty 2

## 2023-05-30 MED ORDER — MIDAZOLAM HCL 2 MG/2ML IJ SOLN
INTRAMUSCULAR | Status: AC
  Filled 2023-05-30: qty 2

## 2023-05-30 MED ORDER — FENTANYL CITRATE PF 50 MCG/ML IJ SOSY
50.0000 ug | PREFILLED_SYRINGE | INTRAMUSCULAR | Status: DC | PRN
  Administered 2023-05-30: 100 ug via INTRAVENOUS
  Administered 2023-05-30: 50 ug via INTRAVENOUS
  Administered 2023-05-31 – 2023-06-02 (×6): 100 ug via INTRAVENOUS
  Filled 2023-05-30 (×10): qty 2

## 2023-05-30 NOTE — Procedures (Addendum)
 Patient Name: Morgan Morgan  MRN: [id number]  Epilepsy Attending: Arleene Lack  Referring Physician/Provider: Joesph Mussel, DO  Duration: 05/29/2023 1100 to 05/30/2023 1245  Patient history: 73 y.o. female with acute encephalopathy of unclear origin. EEG to evaluate for seizure   Level of alertness:  comatose  AEDs during EEG study: Propofol  Technical aspects: This EEG study was done with scalp electrodes positioned according to the 10-20 International system of electrode placement. Electrical activity was reviewed with band pass filter of 1-70Hz , sensitivity of 7 uV/mm, display speed of 37mm/sec with a 60Hz  notched filter applied as appropriate. EEG data were recorded continuously and digitally stored.  Video monitoring was available and reviewed as appropriate.  Description:  EEG showed continuous generalized 3-5Hz  theta-delta slowing, at times with triphasic moprhology admixed with 12-14hz  beta activity. Hyperventilation and photic stimulation were not performed.     EEG was not recorded between 05/30/2023 0730 to 0914 due to technical issues.    ABNORMALITY - Continuous slow, generalized   IMPRESSION: This study is suggestive of severe diffuse encephalopathy. No seizures or epileptiform discharges were seen throughout the recording.   Ashante Snelling O Danna Sewell

## 2023-05-30 NOTE — Progress Notes (Signed)
   NAME:  Morgan Morgan, MRN:  [id number], DOB:  Nov 23, 1950, LOS: 5 ADMISSION DATE:  05/25/2023, CONSULTATION DATE:  05/30/2023  REFERRING MD:  Sonia Durand, CHIEF COMPLAINT: Altered mental status  History of Present Illness:  Patient is unresponsive, history obtained via chart review. 73 year old woman BIBEMS as a code stroke after being found down by her grandson.  Initial neuroexam she was obtunded with disconjugate gaze, grimaces to pain, withdrawing both lower extremities and localizing in uppers.  Head CT and CT angio was negative as well as UDS Initial labs significant for mild leukocytosis and thrombocytopenia with lactate of 3.3 and CK of 672 with hypokalemia.  Respiratory viral panel was positive for rhinovirus. She subsequently developed fever She was empirically treated with ceftriaxone, vancomycin and ampicillin with acyclovir Repeat labs showed lactate rising to 4.2, PCCM consulted due to concern for ability to maintain airway EKG showed atrial fibrillation MRI brain was negative  Pertinent  Medical History  Asthma Hypertension  Significant Hospital Events: Including procedures, antibiotic start and stop dates in addition to other pertinent events   4/9 admitted with AMS, worsened overnight requiring intubation and ICU txfr 4/11 remains intubated with mental status precluding extubation, plan for LP with IR today  Interim History / Subjective:  No events. Remains very encephalopathic.  Objective   Blood pressure (!) 96/57, pulse 75, temperature 98.4 F (36.9 C), resp. rate 20, height 5\' 1"  (1.549 m), weight 93 kg, SpO2 100%. CVP:  [4 mmHg-9 mmHg] 6 mmHg  Vent Mode: PRVC FiO2 (%):  [40 %] 40 % Set Rate:  [20 bmp] 20 bmp Vt Set:  [380 mL] 380 mL PEEP:  [5 cmH20] 5 cmH20 Pressure Support:  [5 cmH20] 5 cmH20 Plateau Pressure:  [10 cmH20-13 cmH20] 13 cmH20   Intake/Output Summary (Last 24 hours) at 05/30/2023 0942 Last data filed at 05/30/2023 0900 Gross per 24 hour   Intake 3737.01 ml  Output 2501 ml  Net 1236.01 ml   Filed Weights   05/28/23 0425 05/29/23 0244 05/30/23 0135  Weight: 93 kg 93 kg 93 kg   Poorly responsive on vent Opens eyes to voice Localizes to pain Brainstem reflexes are intact Ext warm Lungs sound okay, good lung mechanics +murmur  K being repleted  Assessment & Plan:  Encephalopathy Query L2 disciitis Severely elevated Pct Afib, Type II MRI, HFrEF- thought to be NOMI process (reactive) Rhinovirus  Overall sepsis picture improving Unable to get CSF despite multiple LP attempts Remains profoundly encephalopathic- EEG neg for seizures GOC discussion today, if ongoing aggressive care desired will get MRI w/ contrast to look for signs of encephalitis on contrast-enhanced flair sequence Do we do bone biopsies here?: will discuss with IR Continue broad spectrum abx, f/u HSV IgM, fungitell Rare case where I want to do LR + lasix to reduce acyclovir crystallopathy risk while addressing fluid overload Depending on neuro trajectory may warrant ischemic workup  Best Practice (right click and "Reselect all SmartList Selections" daily)   Diet/type: TF DVT prophylaxis: heparin Pressure ulcer(s): N/A GI prophylaxis: Pepcid Lines: Central line Foley:  yes and still needed  Code Status:  full code Last date of multidisciplinary goals of care discussion [4/12- sister ]  34 minutes cc time Ardelle Kos MD PCCM

## 2023-05-30 NOTE — Progress Notes (Signed)
 Subjective:  intubated   Antibiotics:  Anti-infectives (From admission, onward)    Start     Dose/Rate Route Frequency Ordered Stop   05/28/23 1400  acyclovir (ZOVIRAX) 700 mg in dextrose 5 % 100 mL IVPB  Status:  Discontinued        700 mg 114 mL/hr over 60 Minutes Intravenous Every 8 hours 05/28/23 1114 05/30/23 1100   05/28/23 1200  ampicillin (OMNIPEN) 2 g in sodium chloride 0.9 % 100 mL IVPB        2 g 300 mL/hr over 20 Minutes Intravenous Every 4 hours 05/28/23 1114     05/27/23 1930  vancomycin (VANCOREADY) IVPB 750 mg/150 mL        750 mg 150 mL/hr over 60 Minutes Intravenous Every 12 hours 05/27/23 1827     05/27/23 1345  ampicillin (OMNIPEN) 2 g in sodium chloride 0.9 % 100 mL IVPB  Status:  Discontinued        2 g 300 mL/hr over 20 Minutes Intravenous Every 6 hours 05/27/23 1343 05/28/23 1114   05/26/23 0900  vancomycin (VANCOREADY) IVPB 2000 mg/400 mL  Status:  Discontinued        2,000 mg 200 mL/hr over 120 Minutes Intravenous  Once 05/25/23 1921 05/25/23 1943   05/26/23 0800  cefTRIAXone (ROCEPHIN) 2 g in sodium chloride 0.9 % 100 mL IVPB  Status:  Discontinued        2 g 200 mL/hr over 30 Minutes Intravenous Once 05/25/23 1921 05/26/23 0201   05/26/23 0800  cefTRIAXone (ROCEPHIN) 2 g in sodium chloride 0.9 % 100 mL IVPB        2 g 200 mL/hr over 30 Minutes Intravenous Every 12 hours 05/26/23 0201     05/26/23 0600  ampicillin (OMNIPEN) 2 g in sodium chloride 0.9 % 100 mL IVPB  Status:  Discontinued        2 g 300 mL/hr over 20 Minutes Intravenous Every 8 hours 05/25/23 1921 05/27/23 1343   05/26/23 0400  vancomycin (VANCOREADY) IVPB 750 mg/150 mL  Status:  Discontinued        750 mg 150 mL/hr over 60 Minutes Intravenous Every 12 hours 05/25/23 1947 05/27/23 1827   05/25/23 2000  acyclovir (ZOVIRAX) 1,000 mg in dextrose 5 % 250 mL IVPB  Status:  Discontinued        1,000 mg 270 mL/hr over 60 Minutes Intravenous Every 12 hours 05/25/23 1814 05/25/23  1822   05/25/23 2000  acyclovir (ZOVIRAX) 700 mg in dextrose 5 % 100 mL IVPB  Status:  Discontinued        700 mg 114 mL/hr over 60 Minutes Intravenous Every 12 hours 05/25/23 1822 05/28/23 1114   05/25/23 1430  cefTRIAXone (ROCEPHIN) 2 g in sodium chloride 0.9 % 100 mL IVPB        2 g 200 mL/hr over 30 Minutes Intravenous Once 05/25/23 1419 05/25/23 1940   05/25/23 1430  vancomycin (VANCOCIN) IVPB 1000 mg/200 mL premix  Status:  Discontinued        1,000 mg 200 mL/hr over 60 Minutes Intravenous  Once 05/25/23 1419 05/25/23 1426   05/25/23 1430  ampicillin (OMNIPEN) 2 g in sodium chloride 0.9 % 100 mL IVPB        2 g 300 mL/hr over 20 Minutes Intravenous  Once 05/25/23 1419 05/25/23 1726   05/25/23 1430  vancomycin (VANCOREADY) IVPB 2000 mg/400 mL        2,000 mg  200 mL/hr over 120 Minutes Intravenous  Once 05/25/23 1426 05/25/23 1826       Medications: Scheduled Meds:  arformoterol  15 mcg Nebulization BID   budesonide (PULMICORT) nebulizer solution  0.25 mg Nebulization BID   Chlorhexidine Gluconate Cloth  6 each Topical Daily   famotidine  20 mg Per Tube BID   feeding supplement (PROSource TF20)  60 mL Per Tube BID   furosemide  40 mg Intravenous Q8H   insulin aspart  0-9 Units Subcutaneous Q4H   mouth rinse  15 mL Mouth Rinse Q2H   polyethylene glycol  17 g Per Tube Daily   thiamine (VITAMIN B1) injection  100 mg Intravenous Daily   Continuous Infusions:  ampicillin (OMNIPEN) IV Stopped (05/30/23 1148)   cefTRIAXone (ROCEPHIN)  IV Stopped (05/30/23 0901)   feeding supplement (VITAL 1.5 CAL) 45 mL/hr at 05/30/23 1400   heparin 1,850 Units/hr (05/30/23 1400)   propofol (DIPRIVAN) infusion Stopped (05/30/23 0845)   vancomycin Stopped (05/30/23 0729)   PRN Meds:.acetaminophen, docusate, fentaNYL (SUBLIMAZE) injection, metoprolol tartrate, mouth rinse    Objective: Weight change: 0 kg  Intake/Output Summary (Last 24 hours) at 05/30/2023 1511 Last data filed at 05/30/2023  1400 Gross per 24 hour  Intake 3450.59 ml  Output 2380 ml  Net 1070.59 ml   Blood pressure (!) 98/47, pulse 76, temperature 98.4 F (36.9 C), resp. rate 20, height 5\' 1"  (1.549 m), weight 93 kg, SpO2 99%. Temp:  [95.7 F (35.4 C)-98.8 F (37.1 C)] 98.4 F (36.9 C) (04/14 1345) Pulse Rate:  [69-92] 76 (04/14 1345) Resp:  [14-26] 20 (04/14 1345) BP: (89-104)/(41-66) 98/47 (04/14 1345) SpO2:  [95 %-100 %] 99 % (04/14 1345) Arterial Line BP: (96-141)/(42-64) 119/55 (04/14 1200) FiO2 (%):  [40 %] 40 % (04/14 1412) Weight:  [93 kg] 93 kg (04/14 0135)  Physical Exam: Physical Exam Constitutional:      Interventions: She is intubated.  Eyes:     General:        Right eye: No discharge.        Left eye: No discharge.  Cardiovascular:     Rate and Rhythm: Regular rhythm.     Heart sounds: No murmur heard.    No gallop.  Pulmonary:     Effort: She is intubated.  Abdominal:     General: There is no distension.  Musculoskeletal:        General: Normal range of motion.  Skin:    General: Skin is warm and dry.      CBC:    BMET Recent Labs    05/29/23 0240 05/30/23 0500  NA 138 142  K 3.5 3.7  CL 104 105  CO2 26 30  GLUCOSE 116* 110*  BUN 27* 23  CREATININE 0.69 0.74  CALCIUM 7.8* 7.6*     Liver Panel  No results for input(s): "PROT", "ALBUMIN", "AST", "ALT", "ALKPHOS", "BILITOT", "BILIDIR", "IBILI" in the last 72 hours.     Sedimentation Rate No results for input(s): "ESRSEDRATE" in the last 72 hours. C-Reactive Protein No results for input(s): "CRP" in the last 72 hours.  Micro Results: Recent Results (from the past 720 hours)  Resp panel by RT-PCR (RSV, Flu A&B, Covid) Anterior Nasal Swab     Status: None   Collection Time: 05/25/23  5:50 PM   Specimen: Anterior Nasal Swab  Result Value Ref Range Status   SARS Coronavirus 2 by RT PCR NEGATIVE NEGATIVE Final   Influenza A by PCR NEGATIVE NEGATIVE  Final   Influenza B by PCR NEGATIVE NEGATIVE Final     Comment: (NOTE) The Xpert Xpress SARS-CoV-2/FLU/RSV plus assay is intended as an aid in the diagnosis of influenza from Nasopharyngeal swab specimens and should not be used as a sole basis for treatment. Nasal washings and aspirates are unacceptable for Xpert Xpress SARS-CoV-2/FLU/RSV testing.  Fact Sheet for Patients: BloggerCourse.com  Fact Sheet for Healthcare Providers: SeriousBroker.it  This test is not yet approved or cleared by the [place]  FDA and has been authorized for detection and/or diagnosis of SARS-CoV-2 by FDA under an Emergency Use Authorization (EUA). This EUA will remain in effect (meaning this test can be used) for the duration of the COVID-19 declaration under Section 564(b)(1) of the Act, 21 U.S.C. section 360bbb-3(b)(1), unless the authorization is terminated or revoked.     Resp Syncytial Virus by PCR NEGATIVE NEGATIVE Final    Comment: (NOTE) Fact Sheet for Patients: BloggerCourse.com  Fact Sheet for Healthcare Providers: SeriousBroker.it  This test is not yet approved or cleared by the [place]  FDA and has been authorized for detection and/or diagnosis of SARS-CoV-2 by FDA under an Emergency Use Authorization (EUA). This EUA will remain in effect (meaning this test can be used) for the duration of the COVID-19 declaration under Section 564(b)(1) of the Act, 21 U.S.C. section 360bbb-3(b)(1), unless the authorization is terminated or revoked.  Performed at [facility], [address], [place], [place] [address]   Respiratory (~20 pathogens) panel by PCR     Status: Abnormal   Collection Time: 05/25/23  5:50 PM   Specimen: Anterior Nasal Swab; Respiratory  Result Value Ref Range Status   Adenovirus NOT DETECTED NOT DETECTED Final   Coronavirus 229E NOT DETECTED NOT DETECTED Final    Comment: (NOTE) The Coronavirus on the  Respiratory Panel, DOES NOT test for the novel  Coronavirus (2019 nCoV)    Coronavirus HKU1 NOT DETECTED NOT DETECTED Final   Coronavirus NL63 NOT DETECTED NOT DETECTED Final   Coronavirus OC43 NOT DETECTED NOT DETECTED Final   Metapneumovirus NOT DETECTED NOT DETECTED Final   Rhinovirus / Enterovirus DETECTED (A) NOT DETECTED Final   Influenza A NOT DETECTED NOT DETECTED Final   Influenza B NOT DETECTED NOT DETECTED Final   Parainfluenza Virus 1 NOT DETECTED NOT DETECTED Final   Parainfluenza Virus 2 NOT DETECTED NOT DETECTED Final   Parainfluenza Virus 3 NOT DETECTED NOT DETECTED Final   Parainfluenza Virus 4 NOT DETECTED NOT DETECTED Final   Respiratory Syncytial Virus NOT DETECTED NOT DETECTED Final   Bordetella pertussis NOT DETECTED NOT DETECTED Final   Bordetella Parapertussis NOT DETECTED NOT DETECTED Final   Chlamydophila pneumoniae NOT DETECTED NOT DETECTED Final   Mycoplasma pneumoniae NOT DETECTED NOT DETECTED Final    Comment: Performed at [facility], [address] [place], [place] [address]  MRSA Next Gen by PCR, Nasal     Status: None   Collection Time: 05/26/23  3:20 AM   Specimen: Nasal Mucosa; Nasal Swab  Result Value Ref Range Status   MRSA by PCR Next Gen NOT DETECTED NOT DETECTED Final    Comment: (NOTE) The GeneXpert MRSA Assay (FDA approved for NASAL specimens only), is one component of a comprehensive MRSA colonization surveillance program. It is not intended to diagnose MRSA infection nor to guide or monitor treatment for MRSA infections. Test performance is not FDA approved in patients less than 1 years old. Performed at [facility], [address], [place], [place] [address]   Culture, blood (Routine X 2) w Reflex to ID Panel     Status: None (Preliminary result)   Collection Time: 05/26/23  5:46 AM   Specimen: BLOOD LEFT HAND  Result Value Ref Range Status   Specimen Description BLOOD LEFT HAND  Final   Special Requests   Final     BOTTLES DRAWN AEROBIC AND ANAEROBIC Blood Culture adequate volume   Culture   Final    NO GROWTH 4 DAYS Performed at [facility], [address], [place], [place] [address]    Report Status PENDING  Incomplete  Culture, blood (Routine X 2) w Reflex to ID Panel     Status: None (Preliminary result)   Collection Time: 05/26/23 10:06 AM   Specimen: BLOOD RIGHT HAND  Result Value Ref Range Status   Specimen Description BLOOD RIGHT HAND  Final   Special Requests   Final    AEROBIC BOTTLE ONLY Blood Culture results may not be optimal due to an inadequate volume of blood received in culture bottles   Culture   Final    NO GROWTH 4 DAYS Performed at [facility], [address], [place], [place] [address]    Report Status PENDING  Incomplete    Studies/Results: Overnight EEG with video Result Date: 05/30/2023 Charlsie Quest, MD     05/30/2023  9:46 AM Patient Name: Morgan Morgan MRN: [id number] Epilepsy Attending: Charlsie Quest Referring Physician/Provider: Steffanie Dunn, DO Duration: 05/29/2023 1100 to 05/30/2023 0930 Patient history: 73 y.o. female with acute encephalopathy of unclear origin. EEG to evaluate for seizure Level of alertness:  comatose AEDs during EEG study: Propofol Technical aspects: This EEG study was done with scalp electrodes positioned according to the 10-20 International system of electrode placement. Electrical activity was reviewed with band pass filter of 1-70Hz , sensitivity of 7 uV/mm, display speed of 77mm/sec with a 60Hz  notched filter applied as appropriate. EEG data were recorded continuously and digitally stored.  Video monitoring was available and reviewed as appropriate. Description:  EEG showed continuous generalized 3-5Hz  theta-delta slowing, at times with triphasic moprhology admixed with 12-14hz  beta activity. Hyperventilation and photic stimulation were not performed.   EEG was not recorded between 05/30/2023 0730 to 0914 due to technical issues.   ABNORMALITY - Continuous slow, generalized  IMPRESSION: This study is suggestive of severe diffuse encephalopathy. No seizures or epileptiform discharges were seen throughout the recording.  Charlsie Quest   DG Abd Portable 1V Result Date: 05/29/2023 CLINICAL DATA:  [id number] Encounter for orogastric (OG) tube placement [id number] EXAM: PORTABLE ABDOMEN - 1 VIEW COMPARISON:  None Available. FINDINGS: Orogastric tube extends to the gastric fundus. Side port below the GE junction. IMPRESSION: Gastric tube in stomach. Electronically Signed   By: Genevive Bi M.D.   On: 05/29/2023 12:41      Assessment/Plan:  INTERVAL HISTORY:  patient afebrile   Principal Problem:   Lumbar discitis Active Problems:   Altered mental status   Acute encephalopathy   Rhinovirus   Septic shock (HCC)   Osteomyelitis of lumbar spine (HCC)   On mechanically assisted ventilation (HCC)   Acute respiratory failure with hypoxia (HCC)   Diskitis    Morgan Morgan is a 73 y.o. female send to the hospital with high fevers over 103 and septic physiology encephalopathy.  There were attempts made to LP but ultimately they were unsuccessful except for one by IR but the sample was not able  to be processed as it was clotted she had MRI of the lumbar supine which shows evidence of lumbar vertebral osteomyelitis discitis at L2-L3 with paraspinous soft tissue enhancement as well.  Been on broad-spectrum antibiotics in the form of vancomycin and ceftriaxone ampicillin and acyclovir  #1 Likely diskitis vertebral osteomyelitis with sepsis:  Blood cultures on admission negative but this was after she had received several doses of antibiotics on the 9th and 10th  Picture is not at all consistent with herpes encephalitis and I have discontinued the acyclovir  Also think that Listeria is highly unlikely and would be a poor choice to unify her multiple problems.  Will discontinue this and continue her on vancomycin and  ceftriaxone.  I think at this point aspiration of the disc is of extremely poor yield and complicated by the fact that she is anticoagulated  #2 IP: droplets for rhinovirus  #3 GOC: she is not making progress neurologically   CRITICAL CARE Performed by: Deberah [place]   Total critical care time: 32  minutes  Critical care time was exclusive of separately billable procedures and treating other patients.  Critical care was necessary to treat or prevent imminent or life-threatening deterioration.  Critical care was time spent personally by me on the following activities: development of treatment plan with patient and/or surrogate as well as nursing, discussions with consultants, evaluation of patient's response to treatment, examination of patient, obtaining history from patient or surrogate, ordering and performing treatments and interventions, ordering and review of laboratory studies, ordering and review of radiographic studies, pulse oximetry and re-evaluation of patient's condition.  Evaluation of the patient requires complex antimicrobial therapy evaluation, counseling , isolation needs to reduce disease transmission and risk assessment and mitigation.     LOS: 5 days   Sheela Denmark 05/30/2023, 3:11 PM

## 2023-05-30 NOTE — Consult Note (Signed)
 Palliative Medicine Inpatient Consult Note  Consulting Provider: Steffanie Dunn, DO   Reason for consult:   Palliative Care Consult Services Palliative Medicine Consult  Reason for Consult? making no progress, prognosis not looking good   05/30/2023  HPI:  Per intake H&P -->  73 year old woman BIBEMS as a code stroke after being found down by her grandson. Workup for encephalopathy is ongoing considerations inclusive of diskitis. Given the lack of neurological improvement since admission the PMT has been asked to have further goals of care conversations.   Clinical Assessment/Goals of Care:  *Please note that this is a verbal dictation therefore any spelling or grammatical errors are due to the "Dragon Medical One" system interpretation.  I have reviewed medical records including EPIC notes, labs and imaging, received report from bedside RN, assessed the patient.    I met with *** to further discuss diagnosis prognosis, GOC, EOL wishes, disposition and options.   I introduced Palliative Medicine as specialized medical care for people living with serious illness. It focuses on providing relief from the symptoms and stress of a serious illness. The goal is to improve quality of life for both the patient and the family.  Medical History Review and Understanding:    Social History:    Functional and Nutritional State:    Palliative Symptoms:    Advance Directives: A detailed discussion was had today regarding advanced directives.    Code Status: Concepts specific to code status, artifical feeding and hydration, continued IV antibiotics and rehospitalization was had.  The difference between a aggressive medical intervention path  and a palliative comfort care path for this patient at this time was had.   Encouraged patient/family to consider DNR/DNI status understanding evidenced based poor outcomes in similar hospitalized patient, as the cause of arrest is likely associated  with advanced chronic/terminal illness rather than an easily reversible acute cardio-pulmonary event. I explained that DNR/DNI does not change the medical plan and it only comes into effect after a person has arrested (died).  It is a protective measure to keep [place] from harming the patient in their last moments of life. *** was agreeable to DNR/DNI with understanding that patient would not receive CPR, defibrillation, ACLS medications, or intubation.   Discussion:    Discussed the importance of continued conversation with family and their  medical providers regarding overall plan of care and treatment options, ensuring decisions are within the context of the patients values and GOCs.  Provided *** "Hard Choices for Loving People" booklet.   Provided *** "Gone From My Site" booklet.  Decision Maker:  SUMMARY OF RECOMMENDATIONS    Code Status/Advance Care Planning: FULL CODE  DNAR/DNI  Modified CODE   Symptom Management:   Palliative Prophylaxis:  Aspiration, Bowel Regimen, Delirium Protocol, Frequent Pain Assessment, Oral Care, Palliative Wound Care, and Turn Reposition  Additional Recommendations (Limitations, Scope, Preferences): Avoid Hospitalization, Full Scope Treatment, No Artificial Feeding, No Surgical Procedures, and No Tracheostomy  Psycho-social/Spiritual:  Desire for further Chaplaincy support:  Additional Recommendations:    Prognosis:   Discharge Planning:   ROS  Oral Intake %:   I/O:   Bowel Movements:   Mobility:   Vitals:   05/30/23 1330 05/30/23 1345  BP: (!) 96/41 (!) 98/47  Pulse: 79 76  Resp: (!) 23 20  Temp: 98.8 F (37.1 C) 98.4 F (36.9 C)  SpO2: 98% 99%    Intake/Output Summary (Last 24 hours) at 05/30/2023 1531 Last data filed at 05/30/2023 1400 Gross per 24  hour  Intake 3450.59 ml  Output 2380 ml  Net 1070.59 ml   Last Weight  Most recent update: 05/30/2023  1:35 AM    Weight  93 kg (205 lb 0.4 oz)             Gen:   NAD HEENT: moist mucous membranes CV: Regular rate and rhythm, no murmurs rubs or gallops PULM: clear to auscultation bilaterally. No wheezes/rales/rhonchi*** ABD: soft/nontender/nondistended/normal bowel sounds*** EXT: No edema*** Neuro: Alert and oriented x3***  PPS:   This conversation/these recommendations were discussed with patient primary care team, Dr. Aaron Aas  Time In: Time Out: Total Time: ***  Billing based on MDM: ***  {Problems Addressed:304933}  {Amount and/or Complexity of AOZH:086578}  {Risks:304936} ______________________________________________________ [facility] [place] Palliative Medicine Team Team Cell Phone: [phone] Please utilize secure chat with additional questions, if there is no response within 30 minutes please call the above phone number  Palliative Medicine Team providers are available by phone from 7am to 7pm daily and can be reached through the team cell phone.  Should this patient require assistance outside of these hours, please call the patient's attending physician.

## 2023-05-30 NOTE — Progress Notes (Signed)
 NEUROLOGY CONSULT FOLLOW UP NOTE   Date of service: May 30, 2023 Patient Name: Morgan Morgan MRN:  [id number] DOB:  10/01/1950  Interval Hx/subjective   No significant change  Coughed out OG tube with sedation held yesterday, remains on propofol and fentanyl but prop paused for 45 min prior to my exam  Vitals   Vitals:   05/30/23 0815 05/30/23 0830 05/30/23 0835 05/30/23 0845  BP:   (!) 96/57   Pulse: 75 75 73 73  Resp: 20 (!) 23 20 20   Temp: 98.2 F (36.8 C) (!) 97.5 F (36.4 C) 98.1 F (36.7 C) 97.9 F (36.6 C)  TempSrc:      SpO2: 97% 97% 97% 97%  Weight:      Height:         Body mass index is 38.74 kg/m.  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, blinks to eyelid stimulation bilaterally Motor/sensory: Withdraws to stimulation bilaterally, weakly, equally   Medications  Current Facility-Administered Medications:    acetaminophen (TYLENOL) suppository 650 mg, 650 mg, Rectal, Q8H PRN, Lovie Macadamia, MD, 650 mg at 05/26/23 0231   acyclovir (ZOVIRAX) 700 mg in dextrose 5 % 100 mL IVPB, 700 mg, Intravenous, Q8H, Mosetta Anis, RPH, Paused at 05/30/23 0601   ampicillin (OMNIPEN) 2 g in sodium chloride 0.9 % 100 mL IVPB, 2 g, Intravenous, Q4H, Mosetta Anis, RPH, Stopped at 05/30/23 0344   arformoterol (BROVANA) nebulizer solution 15 mcg, 15 mcg, Nebulization, BID, Steffanie Dunn, DO, 15 mcg at 05/30/23 0748   budesonide (PULMICORT) nebulizer solution 0.25 mg, 0.25 mg, Nebulization, BID, Karie Fetch P, DO, 0.25 mg at 05/30/23 0747   cefTRIAXone (ROCEPHIN) 2 g in sodium chloride 0.9 % 100 mL IVPB, 2 g, Intravenous, Q12H, Gwenevere Abbot, MD, Last Rate: 200 mL/hr at 05/30/23 0831, 2 g at 05/30/23 0831   Chlorhexidine Gluconate Cloth 2 % PADS 6 each, 6 each, Topical,  Daily, Dickie La, MD, 6 each at 05/29/23 1110   docusate (COLACE) 50 MG/5ML liquid 100 mg, 100 mg, Per Tube, BID PRN, Oretha Milch, MD   famotidine (PEPCID) tablet 20 mg, 20 mg, Per Tube, BID, Cyril Mourning V, MD, 20 mg at 05/29/23 2042   feeding supplement (PROSource TF20) liquid 60 mL, 60 mL, Per Tube, BID, Steffanie Dunn, DO, 60 mL at 05/29/23 2042   feeding supplement (VITAL 1.5 CAL) liquid 1,000 mL, 1,000 mL, Per Tube, Continuous, Steffanie Dunn, DO, Last Rate: 45 mL/hr at 05/30/23 0800, Infusion Verify at 05/30/23 0800   fentaNYL (SUBLIMAZE) bolus via infusion 25-100 mcg, 25-100 mcg, Intravenous, Q30 min PRN, Mosetta Anis, RPH, 100 mcg at 05/27/23 1320   fentaNYL in NS (34mcg/ml) infusion-PREMIX, 0-400 mcg/hr, Intravenous, Continuous, Gleason, Darcella Gasman, PA-C, Last Rate: 5 mL/hr at 05/30/23 0845, 50 mcg/hr at 05/30/23 0845   heparin ADULT infusion 100 units/mL (25000 units/275mL), 1,750 Units/hr, Intravenous, Continuous, Arabella Merles, RPH, Last Rate: 17.5 mL/hr at 05/30/23 0800, 1,750 Units/hr at 05/30/23 0800   insulin aspart (novoLOG) injection 0-9 Units, 0-9 Units, Subcutaneous, Q4H, ClarkVirl Axe, DO, 1 Units at 05/27/23 2340   metoprolol tartrate (LOPRESSOR) injection 2.5-5 mg, 2.5-5 mg, Intravenous, Q3H PRN, Oretha Milch, MD, 5 mg at 05/26/23 0721   norepinephrine (LEVOPHED) 4mg  in (0.016  mg/mL) premix infusion, 0-40 mcg/min, Intravenous, Titrated, Gleason, Darcella Gasman, PA-C, Stopped at 05/28/23 1900   Oral care mouth rinse, 15 mL, Mouth Rinse, Q2H, Oretha Milch, MD, 15 mL at 05/30/23 1610   Oral care mouth rinse, 15 mL, Mouth Rinse, PRN, Oretha Milch, MD   polyethylene glycol (MIRALAX / GLYCOLAX) packet 17 g, 17 g, Per Tube, Daily, Oretha Milch, MD, 17 g at 05/29/23 1108   propofol (DIPRIVAN) 1000 MG/100ML infusion, 0-50 mcg/kg/min, Intravenous, Continuous, Oretha Milch, MD, Stopped at 05/30/23 0845   thiamine (VITAMIN B1) injection 100 mg, 100 mg,  Intravenous, Daily, Gleason, Darcella Gasman, PA-C, 100 mg at 05/29/23 1108   vancomycin (VANCOREADY) IVPB 750 mg/150 mL, 750 mg, Intravenous, Q12H, Kendal Hymen, RPH, Stopped at 05/30/23 0729  Labs and Diagnostic Imaging  Her initial procalcitonin was 94   Basic Metabolic Panel: Recent Labs  Lab 05/26/23 2045 05/27/23 0409 05/27/23 0412 05/28/23 0421 05/28/23 2231 05/29/23 0240 05/30/23 0500  NA 138 138 137 138 139 138 142  K 3.4* 4.8 4.8 3.5 3.7 3.5 3.7  CL 105 107  --  104 108 104 105  CO2 23 23  --  26 25 26 30   GLUCOSE 107* 99  --  136* 105* 116* 110*  BUN 21 24*  --  28* 27* 27* 23  CREATININE 1.05* 1.06*  --  1.02* 0.74 0.69 0.74  CALCIUM 7.8* 7.7*  --  7.7* 7.7* 7.8* 7.6*  MG 2.7* 2.6*  --  2.5*  --  2.4 2.2  PHOS  --  3.9  --  3.0  --  2.3* 3.7    CBC: Recent Labs  Lab 05/25/23 1224 05/25/23 2028 05/26/23 0545 05/26/23 0657 05/26/23 1006 05/27/23 0409 05/27/23 0412 05/28/23 0421 05/29/23 0240 05/30/23 0500  WBC 15.6*   < > 21.7*  --  23.1* 24.4*  --  21.5* 12.3* 11.3*  NEUTROABS 14.4*  --  19.1*  --   --   --   --   --   --   --   HGB 14.8   < > 15.0   < > 14.8 12.4 13.3 10.9* 10.2* 9.5*  HCT 45.0   < > 44.1   < > 44.4 37.4 39.0 32.8* 31.0* 28.8*  MCV 86.2   < > 83.4  --  84.4 85.2  --  85.4 86.1 87.0  PLT 143*   < > 197  --  178 152  --  121* 137* 164   < > = values in this interval not displayed.    Coagulation Studies: No results for input(s): "LABPROT", "INR" in the last 72 hours.    Imaging(Personally reviewed): MRI of the brain is negative CT angio without LVO  Assessment   Mckenlee Mangham is a 73 y.o. female with acute encephalopathy of unclear origin.  Her mental status worsened overnight, and she was intubated.  She has been started on broad-spectrum meningitic coverage and given that she is febrile without source, and densely encephalopathic, this has been continued without any significant change in mental status though leukocytosis has been  improving  LP was unable to be obtained at the bedside, and therefore multiple providers tried under fluoroscopy all of which were unable to be done.  After MRI of the L-spine, it was reattempted with a C arm, and bloody fluid was obtained and sent to the lab where it was found to be completely clotted and therefore felt to be blood not bloody  CSF.  She is not making any progress, she had an overnight cerebellum and negative 20-minute EEG, but has not had an overnight 23-lead EEG.  I think it may be useful to completely exclude intermittent seizures with overnight EEG, but if this is negative then I think the neck step would simply be giving her time to awaken.  Her B12 is low, though I do not think it was the primary etiology of her presentation, I certainly think it should be repleted and therefore I have started her on B12 shots.  It appears that she may have osteomyelitis, and it is possible especially in the setting of her low B12, that this represents delirium in the setting of other infection, but especially given neck stiffness that I have appreciated I would favor ruling out meningitis if possible, or empiric treatment if not.  Serum HSV antibodies pending, consider stopping acyclovir if negative, my colleague's suspicion for viral infection was fairly low.  With such a high procalcitonin, I agree the differential remains meningitis versus septic encephalopathy.  MRI brain w/ and w/o would be reasonable to repeat after EEG completed, for diagnostic clarity, if within goals of care  Recommendations  Continue empiric treatment for bacterial meningitis Would consider stopping acyclovir if serum HSV antibodies are negative (still pending) Overnight EEG report pending, plan to stop if negative  Consider MRI brain w/ and w/o given limited initial exam due to motion and w/o contrast pending goals of care (not yet ordered, will need to wait for EEG d/c and goals of care discussion) Appreciate  ID involvement and plan for possible disc aspiration/biopsy  Appreciate CCM management of comorbidities and potential planned family meeting at 10 AM  Discussed with CCM via phone, neurology will follow peripherally at this time ______________________________________________________________________  Baldwin Levee MD-PhD [facility] [phone]  Available 7 AM to 7 PM, outside these hours please contact Neurologist on call listed on AMION   CRITICAL CARE Performed by: Ronnette Coke   Total critical care time: 35 minutes  Critical care time was exclusive of separately billable procedures and treating other patients.  Critical care was necessary to treat or prevent imminent or life-threatening deterioration.  Critical care was time spent personally by me on the following activities: development of treatment plan with patient and/or surrogate as well as nursing, discussions with consultants, evaluation of patient's response to treatment, examination of patient, obtaining history from patient or surrogate, ordering and performing treatments and interventions, ordering and review of laboratory studies, ordering and review of radiographic studies, pulse oximetry and re-evaluation of patient's condition.  Baldwin Levee MD-PhD [facility] [phone]  If 7pm- 7am, please page neurology on call as listed in AMION. 05/30/2023  9:23 AM

## 2023-05-30 NOTE — Progress Notes (Signed)
 Nutrition Follow-up  DOCUMENTATION CODES:   Not applicable  INTERVENTION:   Continue tube feeding via OG tube: Vital 1.5 at 45 ml/h (1080 ml per day). Prosource TF20 60 ml BID. Provides 1780 kcal, 113 gm protein, 825 ml free water daily.  NUTRITION DIAGNOSIS:   Inadequate oral intake related to inability to eat as evidenced by NPO status.  GOAL:   Patient will meet greater than or equal to 90% of their needs  MONITOR:   Vent status, TF tolerance  REASON FOR ASSESSMENT:   Consult Enteral/tube feeding initiation and management  ASSESSMENT:   73 yo female admitted with AMS, acute encephalopathy after being found down at home. Patient decompensated 4/10 early AM and required transfer to the ICU and intubated later in the day. PMH includes asthma, HTN.  Cortrak tube was placed today; tip is gastric. Patient is currently receiving Vital 1.5 at 45 ml/h with Prosource TF20 60 ml BID to meet 100% of estimated nutrition needs.   Multiple attempts at LP were unsuccessful. MD is discussing GOC with family. May require MRI brain to help determine cause of encephalopathy.   Patient remains intubated on ventilator support MV: 7.6 L/min Temp (24hrs), Avg:97.7 F (36.5 C), Min:95.7 F (35.4 C), Max:98.8 F (37.1 C)  Propofol: off  Labs reviewed.  CBG: 116-110-131  Medications reviewed and include pepcid, lasix, novolog, miralax, klor-con, thiamine, IV antibiotics. Propofol and levophed were stopped today.  IVF discontinued today.  I/O +10.9 L since admission UOP 2200 ml + 2 unmeasured occurrences x 24 hours.   Admit weight: 99.4 kg Weight 4/14: 93 kg  Diet Order:   Diet Order             Diet NPO time specified  Diet effective now                   EDUCATION NEEDS:   No education needs have been identified at this time  Skin:  Skin Assessment: Reviewed RN Assessment (L pretibial blister)  Last BM:  4/13 type 7  Height:   Ht Readings from Last 1  Encounters:  05/26/23 5\' 1"  (1.549 m)    Weight:   Wt Readings from Last 1 Encounters:  05/30/23 93 kg    BMI:  Body mass index is 38.74 kg/m.  Estimated Nutritional Needs:   Kcal:  1600-1800  Protein:  95-115 gm  Fluid:  1.6-1.8 L   Barnet Boots RD, LDN, CNSC Contact via secure chat. If unavailable, use group chat "RD Inpatient."

## 2023-05-30 NOTE — Progress Notes (Signed)
 eLink Physician-Brief Progress Note Patient Name: Morgan Morgan DOB: 15-Nov-1950 MRN: [id number]   Date of Service  05/30/2023  HPI/Events of Note  Notified by radiology on MRI findings consistent with meningitis On vancomyinand ceftriaxone Has received ampicillin and acyclovir Blood culture NG 4 days  eICU Interventions  Continue current therapies IDS following and will defer further antibiotic adjustments     Intervention Category Intermediate Interventions: Diagnostic test evaluation  Morgan Morgan 05/30/2023, 9:23 PM

## 2023-05-30 NOTE — Progress Notes (Signed)
 05/30/2023 No events, Discussed with ID: vanc/ceftriaxone, repeat MRI with IV contrast Discussion with family as noted below DC LTVEEG If no progress over next several days we may have to revisit GOC. Continue diuresis.  Ardelle Kos MD PCCM

## 2023-05-30 NOTE — Progress Notes (Signed)
 PHARMACY - ANTICOAGULATION CONSULT NOTE  Pharmacy Consult for heparin Indication: atrial fibrillation  Allergies  Allergen Reactions   Zithromax [Azithromycin] Itching    Patient Measurements: Height: 5\' 1"  (154.9 cm) Weight: 93 kg (205 lb 0.4 oz) IBW/kg (Calculated) : 47.8 HEPARIN DW (KG): 68.3  Vital Signs: Temp: 98.5 F (36.9 C) (04/14 1947) Temp Source: Axillary (04/14 1947) BP: 116/51 (04/14 2015) Pulse Rate: 76 (04/14 2015)  Labs: Recent Labs    05/28/23 0421 05/28/23 2231 05/29/23 0240 05/29/23 1212 05/30/23 0500 05/30/23 1027 05/30/23 2142  HGB 10.9*  --  10.2*  --  9.5*  --   --   HCT 32.8*  --  31.0*  --  28.8*  --   --   PLT 121*  --  137*  --  164  --   --   HEPARINUNFRC  --   --  <0.10*   < > 0.44 0.28* 0.63  CREATININE 1.02* 0.74 0.69  --  0.74  --   --    < > = values in this interval not displayed.    Estimated Creatinine Clearance: 65.2 mL/min (by C-G formula based on SCr of 0.74 mg/dL).   Medical History: Past Medical History:  Diagnosis Date   Asthma    Hypertension     Medications:  Scheduled:   arformoterol  15 mcg Nebulization BID   budesonide (PULMICORT) nebulizer solution  0.25 mg Nebulization BID   Chlorhexidine Gluconate Cloth  6 each Topical Daily   famotidine  20 mg Per Tube BID   feeding supplement (PROSource TF20)  60 mL Per Tube BID   furosemide  40 mg Intravenous Q8H   insulin aspart  0-9 Units Subcutaneous Q4H   midazolam  4 mg Intravenous Once   mouth rinse  15 mL Mouth Rinse Q2H   polyethylene glycol  17 g Per Tube Daily   thiamine (VITAMIN B1) injection  100 mg Intravenous Daily    Assessment: 72 yom presenting with AMS. No AC PTA. Found to be in Afib this admission, heparin per pharmacy ordered.  -heparin level 0.63 and at goal on 1850 units/hr   Goal of Therapy:  Heparin level 0.3-0.7 units/ml Monitor platelets by anticoagulation protocol: Yes   Plan:  -Continue hepatin at 1850 units/hr -Heparin level  and CBC in am  Baxter Limber, PharmD Clinical Pharmacist **Pharmacist phone directory can now be found on [email or web address] ([email or web address]).  Listed under [facility].

## 2023-05-30 NOTE — Progress Notes (Signed)
 LTM EEG disconnected - no skin breakdown at [facility].

## 2023-05-30 NOTE — Progress Notes (Signed)
 PHARMACY - ANTICOAGULATION CONSULT NOTE  Pharmacy Consult for heparin Indication: atrial fibrillation  Labs: Recent Labs    05/27/23 0715 05/27/23 0914 05/28/23 0211 05/28/23 0421 05/28/23 2231 05/29/23 0240 05/29/23 1212 05/29/23 2132 05/30/23 0500  HGB  --   --    < > 10.9*  --  10.2*  --   --  9.5*  HCT  --   --   --  32.8*  --  31.0*  --   --  28.8*  PLT  --   --   --  121*  --  137*  --   --  164  HEPARINUNFRC  --   --    < >  --   --  <0.10* 0.14* 0.28* 0.44  CREATININE  --   --   --  1.02* 0.74 0.69  --   --   --   TROPONINIHS 92* 81*  --   --   --   --   --   --   --    < > = values in this interval not displayed.   Assessment/Plan:  73yo female therapeutic on heparin after rate changes. Will continue infusion at current rate of 1750 units/hr and confirm stable with additional level.  Lonnie Roberts, PharmD, BCPS 05/30/2023 5:45 AM

## 2023-05-30 NOTE — Progress Notes (Signed)
 LTM maint complete - no skin breakdown under:  Fp2, Fz

## 2023-05-30 NOTE — Procedures (Signed)
 Cortrak  Person Inserting Tube:  Edwena Graham D, RD Tube Type:  Cortrak - 43 inches Tube Size:  10 Tube Location:  Left nare Initial Placement:  Stomach Secured by: Bridle Technique Used to Measure Tube Placement:  Marking at nare/corner of mouth Cortrak Secured At:  61 cm Procedure Comments:  Cortrak Tube Team Note:  Consult received to place a Cortrak feeding tube.   No x-ray is required. RN may begin using tube.   If the tube becomes dislodged please keep the tube and contact the Cortrak team at [email or web address] for replacement.  If after hours and replacement cannot be delayed, place a NG tube and confirm placement with an abdominal x-ray.    Edwena Graham, RD, LDN Registered Dietitian II Please reach out via secure chat

## 2023-05-30 NOTE — Progress Notes (Signed)
 PHARMACY - ANTICOAGULATION CONSULT NOTE  Pharmacy Consult for heparin Indication: atrial fibrillation  Allergies  Allergen Reactions   Zithromax [Azithromycin] Itching    Patient Measurements: Height: 5\' 1"  (154.9 cm) Weight: 93 kg (205 lb 0.4 oz) IBW/kg (Calculated) : 47.8 HEPARIN DW (KG): 68.3  Vital Signs: Temp: 98.6 F (37 C) (04/14 1100) Temp Source: Esophageal (04/14 0800) BP: 99/45 (04/14 1100) Pulse Rate: 73 (04/14 1100)  Labs: Recent Labs    05/28/23 0421 05/28/23 2231 05/29/23 0240 05/29/23 1212 05/29/23 2132 05/30/23 0500 05/30/23 1027  HGB 10.9*  --  10.2*  --   --  9.5*  --   HCT 32.8*  --  31.0*  --   --  28.8*  --   PLT 121*  --  137*  --   --  164  --   HEPARINUNFRC  --   --  <0.10*   < > 0.28* 0.44 0.28*  CREATININE 1.02* 0.74 0.69  --   --  0.74  --    < > = values in this interval not displayed.    Estimated Creatinine Clearance: 65.2 mL/min (by C-G formula based on SCr of 0.74 mg/dL).   Medical History: Past Medical History:  Diagnosis Date   Asthma    Hypertension     Medications:  Scheduled:   arformoterol  15 mcg Nebulization BID   budesonide (PULMICORT) nebulizer solution  0.25 mg Nebulization BID   Chlorhexidine Gluconate Cloth  6 each Topical Daily   famotidine  20 mg Per Tube BID   feeding supplement (PROSource TF20)  60 mL Per Tube BID   furosemide  40 mg Intravenous Q8H   insulin aspart  0-9 Units Subcutaneous Q4H   mouth rinse  15 mL Mouth Rinse Q2H   polyethylene glycol  17 g Per Tube Daily   thiamine (VITAMIN B1) injection  100 mg Intravenous Daily    Assessment: 72 yom presenting with AMS. No AC PTA. Found to be in Afib this admission, heparin per pharmacy ordered.  4/14: Heparin level slightly subtherapeutic at 0.28 while on 1750 units/hr. Hgb down slightly to 9.5, PLT stable at 164. No issues with heparin infusion. There was some bleeding at infusion site, however has not continued bleeding after dressing change.    Goal of Therapy:  Heparin level 0.3-0.7 units/ml Monitor platelets by anticoagulation protocol: Yes   Plan:  Increase heparin gtt to 1850 units/h  Check heparin level in 8 hours CBC, heparin level daily  Juleen Oakland, PharmD PGY1 Pharmacy Resident 05/30/2023 11:49 AM

## 2023-05-30 NOTE — IPAL (Signed)
  Interdisciplinary Goals of Care Family Meeting   Date carried out: 05/30/2023  Location of the meeting: Bedside  Member's involved: Physician, Bedside Registered Nurse, and Family Member or next of kin  Durable Power of Attorney or acting medical decision maker: brother and sister    Discussion: We discussed goals of care for Morgan Morgan .    Discussed concerning continued comatose state and how everything that is treatable is being addressed.  We discussed patient's baseline functional status which was pretty good.  Discussed potential irreversible nature of coma but will give some more time before we discuss potential transition to comfort.  Code status:   Code Status: Full Code   Disposition: Continue current acute care  Time spent for the meeting: 10 mins    Morgan Nigh, MD  05/30/2023, 11:58 AM

## 2023-05-31 ENCOUNTER — Telehealth (HOSPITAL_COMMUNITY): Payer: Self-pay | Admitting: Pharmacy Technician

## 2023-05-31 ENCOUNTER — Other Ambulatory Visit (HOSPITAL_COMMUNITY): Payer: Self-pay

## 2023-05-31 DIAGNOSIS — G039 Meningitis, unspecified: Secondary | ICD-10-CM

## 2023-05-31 DIAGNOSIS — Q446 Cystic disease of liver: Secondary | ICD-10-CM

## 2023-05-31 DIAGNOSIS — I4891 Unspecified atrial fibrillation: Secondary | ICD-10-CM | POA: Diagnosis not present

## 2023-05-31 DIAGNOSIS — B348 Other viral infections of unspecified site: Secondary | ICD-10-CM

## 2023-05-31 DIAGNOSIS — Z7189 Other specified counseling: Secondary | ICD-10-CM | POA: Diagnosis not present

## 2023-05-31 DIAGNOSIS — R569 Unspecified convulsions: Secondary | ICD-10-CM | POA: Diagnosis not present

## 2023-05-31 DIAGNOSIS — Z515 Encounter for palliative care: Secondary | ICD-10-CM

## 2023-05-31 DIAGNOSIS — G9340 Encephalopathy, unspecified: Secondary | ICD-10-CM | POA: Diagnosis not present

## 2023-05-31 DIAGNOSIS — D72829 Elevated white blood cell count, unspecified: Secondary | ICD-10-CM | POA: Diagnosis not present

## 2023-05-31 DIAGNOSIS — M4646 Discitis, unspecified, lumbar region: Secondary | ICD-10-CM | POA: Diagnosis not present

## 2023-05-31 DIAGNOSIS — G009 Bacterial meningitis, unspecified: Secondary | ICD-10-CM | POA: Diagnosis not present

## 2023-05-31 DIAGNOSIS — E538 Deficiency of other specified B group vitamins: Secondary | ICD-10-CM | POA: Diagnosis not present

## 2023-05-31 LAB — CBC
HCT: 30.5 % — ABNORMAL LOW (ref 36.0–46.0)
Hemoglobin: 10 g/dL — ABNORMAL LOW (ref 12.0–15.0)
MCH: 28.5 pg (ref 26.0–34.0)
MCHC: 32.8 g/dL (ref 30.0–36.0)
MCV: 86.9 fL (ref 80.0–100.0)
Platelets: 240 10*3/uL (ref 150–400)
RBC: 3.51 MIL/uL — ABNORMAL LOW (ref 3.87–5.11)
RDW: 16.7 % — ABNORMAL HIGH (ref 11.5–15.5)
WBC: 14.4 10*3/uL — ABNORMAL HIGH (ref 4.0–10.5)
nRBC: 0.1 % (ref 0.0–0.2)

## 2023-05-31 LAB — GLUCOSE, CAPILLARY
Glucose-Capillary: 113 mg/dL — ABNORMAL HIGH (ref 70–99)
Glucose-Capillary: 121 mg/dL — ABNORMAL HIGH (ref 70–99)
Glucose-Capillary: 130 mg/dL — ABNORMAL HIGH (ref 70–99)
Glucose-Capillary: 119 mg/dL — ABNORMAL HIGH (ref 70–99)
Glucose-Capillary: 132 mg/dL — ABNORMAL HIGH (ref 70–99)
Glucose-Capillary: 97 mg/dL (ref 70–99)

## 2023-05-31 LAB — CULTURE, BLOOD (ROUTINE X 2)
Culture: NO GROWTH
Culture: NO GROWTH
Special Requests: ADEQUATE

## 2023-05-31 LAB — BASIC METABOLIC PANEL WITH GFR
Anion gap: 9 (ref 5–15)
BUN: 26 mg/dL — ABNORMAL HIGH (ref 8–23)
CO2: 30 mmol/L (ref 22–32)
Calcium: 8.2 mg/dL — ABNORMAL LOW (ref 8.9–10.3)
Chloride: 105 mmol/L (ref 98–111)
Creatinine, Ser: 0.85 mg/dL (ref 0.44–1.00)
GFR, Estimated: >60 mL/min (ref >60)
Glucose, Bld: 137 mg/dL — ABNORMAL HIGH (ref 70–99)
Potassium: 4.3 mmol/L (ref 3.5–5.1)
Sodium: 144 mmol/L (ref 135–145)

## 2023-05-31 LAB — PHOSPHORUS: Phosphorus: 3.4 mg/dL (ref 2.5–4.6)

## 2023-05-31 LAB — MAGNESIUM: Magnesium: 2.2 mg/dL (ref 1.7–2.4)

## 2023-05-31 LAB — HEPARIN LEVEL (UNFRACTIONATED): Heparin Unfractionated: 0.56 [IU]/mL (ref 0.30–0.70)

## 2023-05-31 LAB — VANCOMYCIN, TROUGH: Vancomycin Tr: 20 ug/mL (ref 15–20)

## 2023-05-31 MED ORDER — POTASSIUM CHLORIDE 20 MEQ PO PACK
40.0000 meq | PACK | Freq: Once | ORAL | Status: AC
  Administered 2023-05-31: 40 meq via ORAL
  Filled 2023-05-31: qty 2

## 2023-05-31 NOTE — Telephone Encounter (Signed)
 Patient Product/process development scientist completed.    The patient is insured through [facility]. Patient has Medicare and is not eligible for a copay card, but may be able to apply for patient assistance or Medicare RX Payment Plan (Patient Must reach out to their plan, if eligible for payment plan), if available.    Ran test claim for Eliquis 5 mg and the current 30 day co-pay is $12.15.  Ran test claim for Xarelto 20 mg and the current 30 day co-pay is $12.15.   This test claim was processed through [facility]- copay amounts may vary at other pharmacies due to pharmacy/plan contracts, or as the patient moves through the different stages of their insurance plan.     Roland Earl, CPHT Pharmacy Technician III Certified Patient Advocate [facility] Patient Advocate Team Direct Number: [phone]  Fax: [phone]

## 2023-05-31 NOTE — Procedures (Signed)
 Patient Name: Morgan Morgan  MRN: [id number]  Epilepsy Attending: Arleene Lack  Referring Physician/Provider: Joesph Mussel, DO  Duration: 05/30/2023 1245 to 05/30/2023 1640   Patient history: 73 y.o. female with acute encephalopathy of unclear origin. EEG to evaluate for seizure    Level of alertness:  comatose   AEDs during EEG study: Propofol   Technical aspects: This EEG study was done with scalp electrodes positioned according to the 10-20 International system of electrode placement. Electrical activity was reviewed with band pass filter of 1-70Hz , sensitivity of 7 uV/mm, display speed of 35mm/sec with a 60Hz  notched filter applied as appropriate. EEG data were recorded continuously and digitally stored.  Video monitoring was available and reviewed as appropriate.   Description:  EEG showed continuous generalized 3-5Hz  theta-delta slowing, at times with triphasic moprhology admixed with 12-14hz  beta activity. Hyperventilation and photic stimulation were not performed.     ABNORMALITY - Continuous slow, generalized   IMPRESSION: This study is suggestive of severe diffuse encephalopathy. No seizures or epileptiform discharges were seen throughout the recording.   Leverne Tessler O Khaylee Mcevoy

## 2023-05-31 NOTE — Progress Notes (Signed)
 Pharmacy Antibiotic Note  Morgan Morgan is a 73 y.o. female admitted on 05/25/2023 with acute encephalopathy and concern for meningitis.  Pharmacy has been consulted for vancomycin dosing.   WBC improved to 14.4, afeb, Scr 0.85 (improved). Repeat brain MRI was obtained on 4/14 and was positive for meningitis. ID is following and has discontinued ampicillin and acyclovir.   Vancomycin trough was obtained this morning and was 20. While this is higher end of therapeutic, will continue current dose given meningitis. LP was attempted four times and not able to be collected/run.  Plan: Continue vancomycin 750 mg IV every 12 hours Ceftriaxone per MD Monitor renal function, cultures, and clinical signs of improvement Monitor vancomycin levels and de-escalate antibiotics as appropriate  Height: 5\' 1"  (154.9 cm) Weight: 93 kg (205 lb 0.4 oz) IBW/kg (Calculated) : 47.8  Temp (24hrs), Avg:98.5 F (36.9 C), Min:97.5 F (36.4 C), Max:99.3 F (37.4 C)  Recent Labs  Lab 05/26/23 0203 05/26/23 0400 05/26/23 0403 05/26/23 0546 05/26/23 1006 05/26/23 1525 05/26/23 2045 05/27/23 0409 05/27/23 1530 05/28/23 0421 05/28/23 2231 05/29/23 0240 05/30/23 0500 05/31/23 0305 05/31/23 0650  WBC 15.9*  --    < >  --  23.1*  --   --  24.4*  --  21.5*  --  12.3* 11.3* 14.4*  --   CREATININE  --   --    < >  --  1.19*  --    < > 1.06*  --  1.02* 0.74 0.69 0.74 0.85  --   LATICACIDVEN 5.1* 4.0*  --  3.2* 3.0* 1.6  --   --   --   --   --   --   --   --   --   VANCOTROUGH  --   --   --   --   --   --   --   --  19  --   --   --   --   --  20   < > = values in this interval not displayed.    Estimated Creatinine Clearance: 61.3 mL/min (by C-G formula based on SCr of 0.85 mg/dL).    Allergies  Allergen Reactions   Zithromax [Azithromycin] Itching    Antimicrobials this admission: Acyclovir 4/9 >>4/14 Ampicillin 4/9 >> 4/14 Ceftriaxone 4/9 >>  Vanc 4/9 >>  Microbiology results: 4/9 RVP:  +rhinovirus 4/10 MRSA neg 4/10 Bcx: ngtd  4/10 sputum: pending  4/10 UA: neg 4/13 fungitell: pending  4/13 HSV IgG: non reactive  Thank you for allowing pharmacy to participate in this patient's care,  Juleen Oakland, PharmD PGY1 Pharmacy Resident 05/31/2023 8:21 AM

## 2023-05-31 NOTE — Progress Notes (Signed)
 NEUROLOGY CONSULT FOLLOW UP NOTE   Date of service: May 31, 2023 Patient Name: Morgan Morgan MRN:  [id number] DOB:  11-16-1950  Interval Hx/subjective   Acyclovir stopped MRI repeat c/w meningitis Off of sedation since 4/14 morning  No acute events overnight per nursing at bedside  Vitals   Vitals:   05/31/23 0415 05/31/23 0430 05/31/23 0445 05/31/23 0500  BP:    (!) 106/58  Pulse: 91 85 90 82  Resp: 20 (!) 25 (!) 25 (!) 24  Temp:      TempSrc:      SpO2: 98% 96% 97% 97%  Weight:      Height:         Body mass index is 38.74 kg/m.  Physical Exam   General: Intubated, triggering vent but on support    Neurologic Examination   MS: She does open eyes to noxious stimulation, but does not move any extremities to command CN: Pupils are reactive, she has dysconjugate gaze, partially suppressed VOR, intact cough, equal blink to eyelash brush Motor/sensory: Withdraws to stimulation in all four extremities, more briskly than yesterday    Medications  Current Facility-Administered Medications:    acetaminophen (TYLENOL) suppository 650 mg, 650 mg, Rectal, Q8H PRN, Morgan Dieter, Morgan, 650 mg at 05/26/23 0231   arformoterol (BROVANA) nebulizer solution 15 mcg, 15 mcg, Nebulization, BID, Morgan Mussel, DO, 15 mcg at 05/31/23 0727   budesonide (PULMICORT) nebulizer solution 0.25 mg, 0.25 mg, Nebulization, BID, Morgan Mussel, DO, 0.25 mg at 05/31/23 0731   cefTRIAXone (ROCEPHIN) 2 g in sodium chloride 0.9 % 100 mL IVPB, 2 g, Intravenous, Q12H, Morgan Masker, Morgan, Stopped at 05/30/23 2157   Chlorhexidine Gluconate Cloth 2 % PADS 6 each, 6 each, Topical, Daily, Bevelyn Bryant, Morgan, 6 each at 05/30/23 1000   docusate (COLACE) 50 MG/5ML liquid 100 mg, 100 mg, Per Tube, BID PRN, Morgan Morgan   famotidine (PEPCID) tablet 20 mg, 20 mg, Per Tube, BID, Morgan Morgan, 20 mg at 05/30/23 2158   feeding supplement (PROSource TF20) liquid 60 mL, 60 mL, Per Tube, BID, Morgan Mussel,  DO, 60 mL at 05/30/23 2158   feeding supplement (VITAL 1.5 CAL) liquid 1,000 mL, 1,000 mL, Per Tube, Continuous, Morgan Mussel, DO, Paused at 05/30/23 1740   fentaNYL (SUBLIMAZE) injection 50-100 mcg, 50-100 mcg, Intravenous, Q2H PRN, Morgan Nigh, Morgan, 50 mcg at 05/30/23 1507   furosemide (LASIX) injection 40 mg, 40 mg, Intravenous, Q8H, Morgan Nigh, Morgan, 40 mg at 05/31/23 0645   heparin ADULT infusion 100 units/mL (25000 units/250mL), 1,850 Units/hr, Intravenous, Continuous, Morgan Morgan, Last Rate: 18.5 mL/hr at 05/31/23 0700, 1,850 Units/hr at 05/31/23 0700   insulin aspart (novoLOG) injection 0-9 Units, 0-9 Units, Subcutaneous, Q4H, Morgan Connor, DO, 1 Units at 05/30/23 1700   metoprolol tartrate (LOPRESSOR) injection 2.5-5 mg, 2.5-5 mg, Intravenous, Q3H PRN, Morgan Morgan, 5 mg at 05/26/23 0721   midazolam (VERSED) injection 4 mg, 4 mg, Intravenous, Once, Morgan Nigh, Morgan   Oral care mouth rinse, 15 mL, Mouth Rinse, Q2H, Morgan Morgan, 15 mL at 05/31/23 0610   Oral care mouth rinse, 15 mL, Mouth Rinse, PRN, Morgan Morgan   polyethylene glycol (MIRALAX / GLYCOLAX) packet 17 g, 17 g, Per Tube, Daily, Morgan Morgan, 17 g at 05/29/23 1108   propofol (DIPRIVAN) 1000 MG/100ML infusion, 0-50 mcg/kg/min, Intravenous, Continuous, Morgan Repine, Morgan, Stopped at  05/30/23 0845   thiamine (VITAMIN B1) injection 100 mg, 100 mg, Intravenous, Daily, Gleason, Darcella Gasman, PA-C, 100 mg at 05/30/23 0944   vancomycin (VANCOREADY) IVPB 750 mg/150 mL, 750 mg, Intravenous, Q12H, Morgan Morgan, Morgan, Stopped at 05/30/23 2207  Labs and Diagnostic Imaging   Her initial procalcitonin was 94 --> 4.73 on 4/14 HIV negative  B12 153  Basic Metabolic Panel: Recent Labs  Lab 05/27/23 0409 05/27/23 0412 05/28/23 0421 05/28/23 2231 05/29/23 0240 05/30/23 0500 05/31/23 0305  NA 138   < > 138 139 138 142 144  K 4.8   < > 3.5 3.7 3.5 3.7 4.3  CL 107  --  104 108 104 105 105  CO2  23  --  26 25 26 30 30   GLUCOSE 99  --  136* 105* 116* 110* 137*  BUN 24*  --  28* 27* 27* 23 26*  CREATININE 1.06*  --  1.02* 0.74 0.69 0.74 0.85  CALCIUM 7.7*  --  7.7* 7.7* 7.8* 7.6* 8.2*  MG 2.6*  --  2.5*  --  2.4 2.2 2.2  PHOS 3.9  --  3.0  --  2.3* 3.7 3.4   < > = values in this interval not displayed.    CBC: Recent Labs  Lab 05/25/23 1224 05/25/23 2028 05/26/23 0545 05/26/23 0657 05/27/23 0409 05/27/23 0412 05/28/23 0421 05/29/23 0240 05/30/23 0500 05/31/23 0305  WBC 15.6*   < > 21.7*   < > 24.4*  --  21.5* 12.3* 11.3* 14.4*  NEUTROABS 14.4*  --  19.1*  --   --   --   --   --   --   --   HGB 14.8   < > 15.0   < > 12.4 13.3 10.9* 10.2* 9.5* 10.0*  HCT 45.0   < > 44.1   < > 37.4 39.0 32.8* 31.0* 28.8* 30.5*  MCV 86.2   < > 83.4   < > 85.2  --  85.4 86.1 87.0 86.9  PLT 143*   < > 197   < > 152  --  121* 137* 164 240   < > = values in this interval not displayed.    Coagulation Studies: No results for input(s): "LABPROT", "INR" in the last 72 hours.    Imaging(Personally reviewed): Initial MRI of the brain is negative Repeat MRI 05/30/2023 Findings consistent with meningitis. No complicating infarct or abscess. CT angio without LVO  Assessment   Morgan Morgan is a 74 y.o. woman with improving leukocytosis/thrombocytopenia (overall though slight uptrend on last re-check) and procalcitonin, who failed multiple attempts at LP, with EEG negative for seizure activities but repeat MRI brain now confirming clinical suspicion for meninginitis   Exam does seem to be slightly improving today (brisker responses) though still encephalopathic   Note, patients with meningitis are at high risk of cranial neuropathies including hearing loss and eye movement abnormalities -- will need to assess as mental status improves but this could affect her ability to follow commands as well (discussed with CCM)  Impression: Meningitis, likely bacterial given time course of imaging findings  (initial negative MRI, now c/w meningitis) Likely multifactorial delirium in the setting of meninigitis, sedation, intubation, hospitalization  Possible L2 discitis (did have LP attempts at L5-S1 and L2-3 so consider post procedural effect)  Afib on anticoagulation (in the setting of severe illness here, new diagnosis) Rhinovirus B12 deficiency being repleted; also on empiric thiamine BMI 38.74 Polycystic liver disease  Recommendations  Continue treatment for bacterial meningitis, appreciate ID management of antibiotics  Appreciate CCM management of comorbidities  Consider audiology eval if patient seems to have hearing loss as encephalopathy clears  Discussed with CCM at bedside, neurology will sign off at this time. Please do not hesitate to reach out if additional questions or concerns arise, including any further concern for seizures or new neurological deficits  ______________________________________________________________________  Baldwin Levee Morgan-PhD [facility] [phone] Available 7 AM to 7 PM, outside these hours please contact Neurologist on call listed on AMION    CRITICAL CARE Performed by: Ronnette Coke   Total critical care time: 35 minutes  Critical care time was exclusive of separately billable procedures and treating other patients.  Critical care was necessary to treat or prevent imminent or life-threatening deterioration.  Critical care was time spent personally by me on the following activities: development of treatment plan with patient and/or surrogate as well as nursing, discussions with consultants, evaluation of patient's response to treatment, examination of patient, obtaining history from patient or surrogate, ordering and performing treatments and interventions, ordering and review of laboratory studies, ordering and review of radiographic studies, pulse oximetry and re-evaluation of patient's condition.

## 2023-05-31 NOTE — Progress Notes (Signed)
 PHARMACY - ANTICOAGULATION CONSULT NOTE  Pharmacy Consult for heparin Indication: atrial fibrillation  Allergies  Allergen Reactions   Zithromax [Azithromycin] Itching    Patient Measurements: Height: 5\' 1"  (154.9 cm) Weight: 93 kg (205 lb 0.4 oz) IBW/kg (Calculated) : 47.8 HEPARIN DW (KG): 68.3  Vital Signs: Temp: 98.8 F (37.1 C) (04/15 0326) Temp Source: Axillary (04/15 0326) BP: 106/58 (04/15 0500) Pulse Rate: 82 (04/15 0500)  Labs: Recent Labs    05/29/23 0240 05/29/23 1212 05/30/23 0500 05/30/23 1027 05/30/23 2142 05/31/23 0305  HGB 10.2*  --  9.5*  --   --  10.0*  HCT 31.0*  --  28.8*  --   --  30.5*  PLT 137*  --  164  --   --  240  HEPARINUNFRC <0.10*   < > 0.44 0.28* 0.63 0.56  CREATININE 0.69  --  0.74  --   --  0.85   < > = values in this interval not displayed.    Estimated Creatinine Clearance: 61.3 mL/min (by C-G formula based on SCr of 0.85 mg/dL).   Medical History: Past Medical History:  Diagnosis Date   Asthma    Hypertension     Medications:  Scheduled:   arformoterol  15 mcg Nebulization BID   budesonide (PULMICORT) nebulizer solution  0.25 mg Nebulization BID   Chlorhexidine Gluconate Cloth  6 each Topical Daily   famotidine  20 mg Per Tube BID   feeding supplement (PROSource TF20)  60 mL Per Tube BID   furosemide  40 mg Intravenous Q8H   insulin aspart  0-9 Units Subcutaneous Q4H   midazolam  4 mg Intravenous Once   mouth rinse  15 mL Mouth Rinse Q2H   polyethylene glycol  17 g Per Tube Daily   thiamine (VITAMIN B1) injection  100 mg Intravenous Daily    Assessment: 72 yom presenting with AMS. No AC PTA. Found to be in Afib this admission, heparin per pharmacy ordered.  4/15 AM: Heparin level remains therapeutic at 0.56 while on 1850 units/hr. CBC stable. No issues with infusion overnight. RN reports minimal bleeding from PIV on R hand and a little pink tinged sputum. Will continue to monitor.  Goal of Therapy:  Heparin  level 0.3-0.7 units/ml Monitor platelets by anticoagulation protocol: Yes   Plan:  -Continue hepatin at 1850 units/hr -Heparin level and CBC daily  Juleen Oakland, PharmD PGY1 Pharmacy Resident 05/31/2023 6:28 AM

## 2023-05-31 NOTE — Progress Notes (Signed)
   NAME:  Morgan Morgan, MRN:  [id number], DOB:  Jul 24, 1950, LOS: 6 ADMISSION DATE:  05/25/2023, CONSULTATION DATE:  05/31/2023  REFERRING MD:  Rockne Coons, CHIEF COMPLAINT: Altered mental status  History of Present Illness:  Patient is unresponsive, history obtained via chart review. 73 year old woman BIBEMS as a code stroke after being found down by her grandson.  Initial neuroexam she was obtunded with disconjugate gaze, grimaces to pain, withdrawing both lower extremities and localizing in uppers.  Head CT and CT angio was negative as well as UDS Initial labs significant for mild leukocytosis and thrombocytopenia with lactate of 3.3 and CK of 672 with hypokalemia.  Respiratory viral panel was positive for rhinovirus. She subsequently developed fever She was empirically treated with ceftriaxone, vancomycin and ampicillin with acyclovir Repeat labs showed lactate rising to 4.2, PCCM consulted due to concern for ability to maintain airway EKG showed atrial fibrillation MRI brain was negative  Pertinent  Medical History  Asthma Hypertension  Significant Hospital Events: Including procedures, antibiotic start and stop dates in addition to other pertinent events   4/9 admitted with AMS, worsened overnight requiring intubation and ICU txfr 4/11 remains intubated with mental status precluding extubation, plan for LP with IR today  Interim History / Subjective:  No events. More brisk response to pain today  Objective   Blood pressure 104/60, pulse 78, temperature 98.2 F (36.8 C), temperature source Oral, resp. rate (!) 26, height 5\' 1"  (1.549 m), weight 93 kg, SpO2 99%. CVP:  [6 mmHg-44 mmHg] 9 mmHg  Vent Mode: PRVC FiO2 (%):  [40 %] 40 % Set Rate:  [20 bmp] 20 bmp Vt Set:  [380 mL] 380 mL PEEP:  [5 cmH20] 5 cmH20 Plateau Pressure:  [10 cmH20-14 cmH20] 10 cmH20   Intake/Output Summary (Last 24 hours) at 05/31/2023 4098 Last data filed at 05/31/2023 0800 Gross per 24 hour  Intake  1631.36 ml  Output 4560 ml  Net -2928.64 ml   Filed Weights   05/28/23 0425 05/29/23 0244 05/30/23 0135  Weight: 93 kg 93 kg 93 kg   Off sedation Pinpoint disconjugate pupils Trace edema Withdraws x 4 Not following commands Lungs more clear  Kidneys tolerating lasix  Assessment & Plan:  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 LP injury? Severely elevated Pct Afib, Type II MRI, HFrEF- thought to be NOMI process (reactive) Rhinovirus  Remains profoundly encephalopathic- EEG neg for seizures Continue abx as outlined by ID, seems to be improving on current regimen Current cranial nerve deficits (?disconjugate gaze, ?hearing) could be related to meningitis Overall give time, remains on vent, may need to discuss trach/PEG with family as in theory much of this should be reversible but it would need to line up with family wishes; will ask palliative to help support Continue diuresis as tolerated by renal function; GDMT and ?LHC on hold pending neuro recovery  Best Practice (right click and "Reselect all SmartList Selections" daily)   Diet/type: TF DVT prophylaxis: heparin Pressure ulcer(s): N/A GI prophylaxis: Pepcid Lines: Central line Foley:  yes and still needed  Code Status:  full code Last date of multidisciplinary goals of care discussion [4/14 see iPAL note]  33 minutes cc time Myrla Halsted MD PCCM

## 2023-06-01 DIAGNOSIS — B348 Other viral infections of unspecified site: Secondary | ICD-10-CM | POA: Diagnosis not present

## 2023-06-01 DIAGNOSIS — G9340 Encephalopathy, unspecified: Secondary | ICD-10-CM | POA: Diagnosis not present

## 2023-06-01 DIAGNOSIS — M4646 Discitis, unspecified, lumbar region: Secondary | ICD-10-CM | POA: Diagnosis not present

## 2023-06-01 DIAGNOSIS — G009 Bacterial meningitis, unspecified: Secondary | ICD-10-CM

## 2023-06-01 DIAGNOSIS — I4891 Unspecified atrial fibrillation: Secondary | ICD-10-CM | POA: Diagnosis not present

## 2023-06-01 DIAGNOSIS — Z7189 Other specified counseling: Secondary | ICD-10-CM | POA: Diagnosis not present

## 2023-06-01 DIAGNOSIS — M4626 Osteomyelitis of vertebra, lumbar region: Secondary | ICD-10-CM | POA: Diagnosis not present

## 2023-06-01 DIAGNOSIS — Z515 Encounter for palliative care: Secondary | ICD-10-CM | POA: Diagnosis not present

## 2023-06-01 LAB — GLUCOSE, CAPILLARY
Glucose-Capillary: 120 mg/dL — ABNORMAL HIGH (ref 70–99)
Glucose-Capillary: 123 mg/dL — ABNORMAL HIGH (ref 70–99)
Glucose-Capillary: 137 mg/dL — ABNORMAL HIGH (ref 70–99)
Glucose-Capillary: 138 mg/dL — ABNORMAL HIGH (ref 70–99)
Glucose-Capillary: 142 mg/dL — ABNORMAL HIGH (ref 70–99)
Glucose-Capillary: 80 mg/dL (ref 70–99)

## 2023-06-01 LAB — CBC
HCT: 30.4 % — ABNORMAL LOW (ref 36.0–46.0)
Hemoglobin: 10.1 g/dL — ABNORMAL LOW (ref 12.0–15.0)
MCH: 29 pg (ref 26.0–34.0)
MCHC: 33.2 g/dL (ref 30.0–36.0)
MCV: 87.4 fL (ref 80.0–100.0)
Platelets: 286 10*3/uL (ref 150–400)
RBC: 3.48 MIL/uL — ABNORMAL LOW (ref 3.87–5.11)
RDW: 16.4 % — ABNORMAL HIGH (ref 11.5–15.5)
WBC: 13.7 10*3/uL — ABNORMAL HIGH (ref 4.0–10.5)
nRBC: 0.5 % — ABNORMAL HIGH (ref 0.0–0.2)

## 2023-06-01 LAB — PHOSPHORUS: Phosphorus: 3.4 mg/dL (ref 2.5–4.6)

## 2023-06-01 LAB — BASIC METABOLIC PANEL WITH GFR
Anion gap: 8 (ref 5–15)
BUN: 31 mg/dL — ABNORMAL HIGH (ref 8–23)
CO2: 33 mmol/L — ABNORMAL HIGH (ref 22–32)
Calcium: 8.3 mg/dL — ABNORMAL LOW (ref 8.9–10.3)
Chloride: 104 mmol/L (ref 98–111)
Creatinine, Ser: 0.79 mg/dL (ref 0.44–1.00)
GFR, Estimated: >60 mL/min (ref >60)
Glucose, Bld: 154 mg/dL — ABNORMAL HIGH (ref 70–99)
Potassium: 3.9 mmol/L (ref 3.5–5.1)
Sodium: 145 mmol/L (ref 135–145)

## 2023-06-01 LAB — HEPARIN LEVEL (UNFRACTIONATED)
Heparin Unfractionated: 0.84 [IU]/mL — ABNORMAL HIGH (ref 0.30–0.70)
Heparin Unfractionated: 0.63 [IU]/mL (ref 0.30–0.70)

## 2023-06-01 LAB — MAGNESIUM: Magnesium: 2.3 mg/dL (ref 1.7–2.4)

## 2023-06-01 MED ORDER — POTASSIUM CHLORIDE 20 MEQ PO PACK
20.0000 meq | PACK | Freq: Once | ORAL | Status: AC
  Administered 2023-06-01: 20 meq via ORAL
  Filled 2023-06-01: qty 1

## 2023-06-01 MED ORDER — FUROSEMIDE 10 MG/ML IJ SOLN: 40.0000 mg | Freq: Every day | INTRAMUSCULAR | Status: DC

## 2023-06-01 NOTE — Progress Notes (Signed)
 Nutrition Brief Note  Review of medication list appeared TF was stopped on 4/14 for MRI but did not appear to have been resumed. Checked in with patient at bedside. TF infusing at goal rate without tolerance issues.   Spoke with RN who reports that MRI was performed late in the evening but TF resumed on night shift.   Will continue to monitor medical status and adjust nutrition interventions as appropriate. Please reach out in the meantime with any nutrition related questions or concerns.   Morgan Morgan, RDN, LDN Clinical Nutrition See AMiON for contact information.

## 2023-06-01 NOTE — Progress Notes (Signed)
 PHARMACY - ANTICOAGULATION CONSULT NOTE  Pharmacy Consult for heparin Indication: atrial fibrillation  Labs: Recent Labs    05/30/23 0500 05/30/23 1027 05/30/23 2142 05/31/23 0305 06/01/23 0401  HGB 9.5*  --   --  10.0* 10.1*  HCT 28.8*  --   --  30.5* 30.4*  PLT 164  --   --  240 286  HEPARINUNFRC 0.44   < > 0.63 0.56 0.84*  CREATININE 0.74  --   --  0.85 0.79   < > = values in this interval not displayed.   Assessment: 72yo female supratherapeutic on heparin after two levels at goal; no infusion issues or signs of bleeding per RN.  Goal of Therapy:  Heparin level 0.3-0.7 units/ml   Plan:  Decrease heparin infusion by 2-3 units/kg/hr to 1600 units/hr. Check level in 6 hours.   Lonnie Roberts, PharmD, BCPS 06/01/2023 5:32 AM

## 2023-06-01 NOTE — Progress Notes (Addendum)
 PHARMACY - ANTICOAGULATION CONSULT NOTE  Pharmacy Consult for heparin Indication: atrial fibrillation  Allergies  Allergen Reactions   Zithromax [Azithromycin] Itching    Patient Measurements: Height: 5\' 1"  (154.9 cm) Weight: 88.9 kg (195 lb 15.8 oz) IBW/kg (Calculated) : 47.8 HEPARIN DW (KG): 68.3  Vital Signs: Temp: 98.9 F (37.2 C) (04/16 0800) Temp Source: Oral (04/16 0800) BP: 107/59 (04/16 1000) Pulse Rate: 90 (04/16 1000)  Labs: Recent Labs    05/30/23 0500 05/30/23 1027 05/30/23 2142 05/31/23 0305 06/01/23 0401  HGB 9.5*  --   --  10.0* 10.1*  HCT 28.8*  --   --  30.5* 30.4*  PLT 164  --   --  240 286  HEPARINUNFRC 0.44   < > 0.63 0.56 0.84*  CREATININE 0.74  --   --  0.85 0.79   < > = values in this interval not displayed.    Estimated Creatinine Clearance: 63.5 mL/min (by C-G formula based on SCr of 0.79 mg/dL).   Medical History: Past Medical History:  Diagnosis Date   Asthma    Hypertension     Medications:  Scheduled:   arformoterol  15 mcg Nebulization BID   budesonide (PULMICORT) nebulizer solution  0.25 mg Nebulization BID   Chlorhexidine Gluconate Cloth  6 each Topical Daily   famotidine  20 mg Per Tube BID   feeding supplement (PROSource TF20)  60 mL Per Tube BID   [START ON 06/02/2023] furosemide  40 mg Intravenous Daily   insulin aspart  0-9 Units Subcutaneous Q4H   midazolam  4 mg Intravenous Once   mouth rinse  15 mL Mouth Rinse Q2H   polyethylene glycol  17 g Per Tube Daily   thiamine (VITAMIN B1) injection  100 mg Intravenous Daily    Assessment: 72 yom presenting with AMS. No AC PTA. Found to be in Afib this admission, heparin per pharmacy ordered.  Heparin level this morning came back therapeutic at 0.63, on 1600 units/hr. Hgb 10.1, plt 286. No s/sx of bleeding or infusion issues - oozing at IV site.  Goal of Therapy:  Heparin level 0.3-0.7 units/ml Monitor platelets by anticoagulation protocol: Yes   Plan:  -Continue  hepatin at 1850 units/hr -Heparin level and CBC daily  Thank you for allowing pharmacy to participate in this patient's care,  Nieves Bars, PharmD, BCCCP Clinical Pharmacist  Phone: [phone] 06/01/2023 11:51 AM  Please check AMION for all [facility] phone numbers After 10:00 PM, call Main Pharmacy [phone]

## 2023-06-01 NOTE — Progress Notes (Signed)
 Subjective:  Intubated   Antibiotics:  Anti-infectives (From admission, onward)    Start     Dose/Rate Route Frequency Ordered Stop   05/28/23 1400  acyclovir (ZOVIRAX) 700 mg in dextrose 5 % 100 mL IVPB  Status:  Discontinued        700 mg 114 mL/hr over 60 Minutes Intravenous Every 8 hours 05/28/23 1114 05/30/23 1100   05/28/23 1200  ampicillin (OMNIPEN) 2 g in sodium chloride 0.9 % 100 mL IVPB  Status:  Discontinued        2 g 300 mL/hr over 20 Minutes Intravenous Every 4 hours 05/28/23 1114 05/30/23 1529   05/27/23 1930  vancomycin (VANCOREADY) IVPB 750 mg/150 mL        750 mg 150 mL/hr over 60 Minutes Intravenous Every 12 hours 05/27/23 1827     05/27/23 1345  ampicillin (OMNIPEN) 2 g in sodium chloride 0.9 % 100 mL IVPB  Status:  Discontinued        2 g 300 mL/hr over 20 Minutes Intravenous Every 6 hours 05/27/23 1343 05/28/23 1114   05/26/23 0900  vancomycin (VANCOREADY) IVPB 2000 mg/400 mL  Status:  Discontinued        2,000 mg 200 mL/hr over 120 Minutes Intravenous  Once 05/25/23 1921 05/25/23 1943   05/26/23 0800  cefTRIAXone (ROCEPHIN) 2 g in sodium chloride 0.9 % 100 mL IVPB  Status:  Discontinued        2 g 200 mL/hr over 30 Minutes Intravenous Once 05/25/23 1921 05/26/23 0201   05/26/23 0800  cefTRIAXone (ROCEPHIN) 2 g in sodium chloride 0.9 % 100 mL IVPB        2 g 200 mL/hr over 30 Minutes Intravenous Every 12 hours 05/26/23 0201     05/26/23 0600  ampicillin (OMNIPEN) 2 g in sodium chloride 0.9 % 100 mL IVPB  Status:  Discontinued        2 g 300 mL/hr over 20 Minutes Intravenous Every 8 hours 05/25/23 1921 05/27/23 1343   05/26/23 0400  vancomycin (VANCOREADY) IVPB 750 mg/150 mL  Status:  Discontinued        750 mg 150 mL/hr over 60 Minutes Intravenous Every 12 hours 05/25/23 1947 05/27/23 1827   05/25/23 2000  acyclovir (ZOVIRAX) 1,000 mg in dextrose 5 % 250 mL IVPB  Status:  Discontinued        1,000 mg 270 mL/hr over 60 Minutes Intravenous Every  12 hours 05/25/23 1814 05/25/23 1822   05/25/23 2000  acyclovir (ZOVIRAX) 700 mg in dextrose 5 % 100 mL IVPB  Status:  Discontinued        700 mg 114 mL/hr over 60 Minutes Intravenous Every 12 hours 05/25/23 1822 05/28/23 1114   05/25/23 1430  cefTRIAXone (ROCEPHIN) 2 g in sodium chloride 0.9 % 100 mL IVPB        2 g 200 mL/hr over 30 Minutes Intravenous Once 05/25/23 1419 05/25/23 1940   05/25/23 1430  vancomycin (VANCOCIN) IVPB 1000 mg/200 mL premix  Status:  Discontinued        1,000 mg 200 mL/hr over 60 Minutes Intravenous  Once 05/25/23 1419 05/25/23 1426   05/25/23 1430  ampicillin (OMNIPEN) 2 g in sodium chloride 0.9 % 100 mL IVPB        2 g 300 mL/hr over 20 Minutes Intravenous  Once 05/25/23 1419 05/25/23 1726   05/25/23 1430  vancomycin (VANCOREADY) IVPB 2000 mg/400 mL  2,000 mg 200 mL/hr over 120 Minutes Intravenous  Once 05/25/23 1426 05/25/23 1826       Medications: Scheduled Meds:  arformoterol  15 mcg Nebulization BID   budesonide (PULMICORT) nebulizer solution  0.25 mg Nebulization BID   Chlorhexidine Gluconate Cloth  6 each Topical Daily   famotidine  20 mg Per Tube BID   feeding supplement (PROSource TF20)  60 mL Per Tube BID   [START ON 06/02/2023] furosemide  40 mg Intravenous Daily   insulin aspart  0-9 Units Subcutaneous Q4H   midazolam  4 mg Intravenous Once   mouth rinse  15 mL Mouth Rinse Q2H   polyethylene glycol  17 g Per Tube Daily   thiamine (VITAMIN B1) injection  100 mg Intravenous Daily   Continuous Infusions:  cefTRIAXone (ROCEPHIN)  IV Stopped (06/01/23 0851)   feeding supplement (VITAL 1.5 CAL) Stopped (05/30/23 1740)   heparin 1,600 Units/hr (06/01/23 1400)   vancomycin Stopped (06/01/23 0746)   PRN Meds:.acetaminophen, docusate, fentaNYL (SUBLIMAZE) injection, metoprolol tartrate, mouth rinse    Objective: Weight change:   Intake/Output Summary (Last 24 hours) at 06/01/2023 1516 Last data filed at 06/01/2023 1500 Gross per 24  hour  Intake 1054.97 ml  Output 3620 ml  Net -2565.03 ml   Blood pressure (!) 126/105, pulse 83, temperature 98.5 F (36.9 C), temperature source Oral, resp. rate (!) 25, height 5\' 1"  (1.549 m), weight 88.9 kg, SpO2 98%. Temp:  [97.7 F (36.5 C)-99.1 F (37.3 C)] 98.5 F (36.9 C) (04/16 1120) Pulse Rate:  [69-112] 83 (04/16 1500) Resp:  [16-32] 25 (04/16 1500) BP: (92-126)/(49-114) 126/105 (04/16 1500) SpO2:  [96 %-100 %] 98 % (04/16 1500) Arterial Line BP: (102-143)/(47-83) 130/64 (04/16 1500) FiO2 (%):  [40 %] 40 % (04/16 1412) Weight:  [88.9 kg] 88.9 kg (04/16 0421)  Physical Exam: Physical Exam Constitutional:      Appearance: She is obese. She is ill-appearing.     Interventions: She is intubated.  HENT:     Head: Normocephalic and atraumatic.  Eyes:     General:        Right eye: No discharge.        Left eye: No discharge.     Extraocular Movements: Extraocular movements intact.  Pulmonary:     Effort: No respiratory distress. She is intubated.     Breath sounds: Rhonchi present.  Abdominal:     General: There is no distension.  Musculoskeletal:        General: Normal range of motion.  Neurological:     General: No focal deficit present.      CBC:    BMET Recent Labs    05/31/23 0305 06/01/23 0401  NA 144 145  K 4.3 3.9  CL 105 104  CO2 30 33*  GLUCOSE 137* 154*  BUN 26* 31*  CREATININE 0.85 0.79  CALCIUM 8.2* 8.3*     Liver Panel  No results for input(s): "PROT", "ALBUMIN", "AST", "ALT", "ALKPHOS", "BILITOT", "BILIDIR", "IBILI" in the last 72 hours.     Sedimentation Rate No results for input(s): "ESRSEDRATE" in the last 72 hours. C-Reactive Protein No results for input(s): "CRP" in the last 72 hours.  Micro Results: Recent Results (from the past 720 hours)  Resp panel by RT-PCR (RSV, Flu A&B, Covid) Anterior Nasal Swab     Status: None   Collection Time: 05/25/23  5:50 PM   Specimen: Anterior Nasal Swab  Result Value Ref Range  Status   SARS Coronavirus  2 by RT PCR NEGATIVE NEGATIVE Final   Influenza A by PCR NEGATIVE NEGATIVE Final   Influenza B by PCR NEGATIVE NEGATIVE Final    Comment: (NOTE) The Xpert Xpress SARS-CoV-2/FLU/RSV plus assay is intended as an aid in the diagnosis of influenza from Nasopharyngeal swab specimens and should not be used as a sole basis for treatment. Nasal washings and aspirates are unacceptable for Xpert Xpress SARS-CoV-2/FLU/RSV testing.  Fact Sheet for Patients: BloggerCourse.com  Fact Sheet for Healthcare Providers: SeriousBroker.it  This test is not yet approved or cleared by the [place] FDA and has been authorized for detection and/or diagnosis of SARS-CoV-2 by FDA under an Emergency Use Authorization (EUA). This EUA will remain in effect (meaning this test can be used) for the duration of the COVID-19 declaration under Section 564(b)(1) of the Act, 21 U.S.C. section 360bbb-3(b)(1), unless the authorization is terminated or revoked.     Resp Syncytial Virus by PCR NEGATIVE NEGATIVE Final    Comment: (NOTE) Fact Sheet for Patients: BloggerCourse.com  Fact Sheet for Healthcare Providers: SeriousBroker.it  This test is not yet approved or cleared by the [place] FDA and has been authorized for detection and/or diagnosis of SARS-CoV-2 by FDA under an Emergency Use Authorization (EUA). This EUA will remain in effect (meaning this test can be used) for the duration of the COVID-19 declaration under Section 564(b)(1) of the Act, 21 U.S.C. section 360bbb-3(b)(1), unless the authorization is terminated or revoked.  Performed at [facility], [address] [place], [place] [address]   Respiratory (~20 pathogens) panel by PCR     Status: Abnormal   Collection Time: 05/25/23  5:50 PM   Specimen: Anterior Nasal Swab; Respiratory  Result Value Ref Range  Status   Adenovirus NOT DETECTED NOT DETECTED Final   Coronavirus 229E NOT DETECTED NOT DETECTED Final    Comment: (NOTE) The Coronavirus on the Respiratory Panel, DOES NOT test for the novel  Coronavirus (2019 nCoV)    Coronavirus HKU1 NOT DETECTED NOT DETECTED Final   Coronavirus NL63 NOT DETECTED NOT DETECTED Final   Coronavirus OC43 NOT DETECTED NOT DETECTED Final   Metapneumovirus NOT DETECTED NOT DETECTED Final   Rhinovirus / Enterovirus DETECTED (A) NOT DETECTED Final   Influenza A NOT DETECTED NOT DETECTED Final   Influenza B NOT DETECTED NOT DETECTED Final   Parainfluenza Virus 1 NOT DETECTED NOT DETECTED Final   Parainfluenza Virus 2 NOT DETECTED NOT DETECTED Final   Parainfluenza Virus 3 NOT DETECTED NOT DETECTED Final   Parainfluenza Virus 4 NOT DETECTED NOT DETECTED Final   Respiratory Syncytial Virus NOT DETECTED NOT DETECTED Final   Bordetella pertussis NOT DETECTED NOT DETECTED Final   Bordetella Parapertussis NOT DETECTED NOT DETECTED Final   Chlamydophila pneumoniae NOT DETECTED NOT DETECTED Final   Mycoplasma pneumoniae NOT DETECTED NOT DETECTED Final    Comment: Performed at [facility], [address] [place], [place] [address]  MRSA Next Gen by PCR, Nasal     Status: None   Collection Time: 05/26/23  3:20 AM   Specimen: Nasal Mucosa; Nasal Swab  Result Value Ref Range Status   MRSA by PCR Next Gen NOT DETECTED NOT DETECTED Final    Comment: (NOTE) The GeneXpert MRSA Assay (FDA approved for NASAL specimens only), is one component of a comprehensive MRSA colonization surveillance program. It is not intended to diagnose MRSA infection nor to guide or monitor treatment for MRSA infections. Test performance is not FDA approved in  patients less than 53 years old. Performed at [facility], [address], [place], [place] [address]   Culture, blood (Routine X 2) w Reflex to ID Panel     Status: None   Collection Time: 05/26/23  5:46 AM   Specimen:  BLOOD LEFT HAND  Result Value Ref Range Status   Specimen Description BLOOD LEFT HAND  Final   Special Requests   Final    BOTTLES DRAWN AEROBIC AND ANAEROBIC Blood Culture adequate volume   Culture   Final    NO GROWTH 5 DAYS Performed at [facility], [address], [place], [place] [address]    Report Status 05/31/2023 FINAL  Final  Culture, blood (Routine X 2) w Reflex to ID Panel     Status: None   Collection Time: 05/26/23 10:06 AM   Specimen: BLOOD RIGHT HAND  Result Value Ref Range Status   Specimen Description BLOOD RIGHT HAND  Final   Special Requests   Final    AEROBIC BOTTLE ONLY Blood Culture results may not be optimal due to an inadequate volume of blood received in culture bottles   Culture   Final    NO GROWTH 5 DAYS Performed at [facility], [address] [place], [place] [address]    Report Status 05/31/2023 FINAL  Final    Studies/Results: MR BRAIN W WO CONTRAST Result Date: 05/30/2023 CLINICAL DATA:  Headache, neuro deficit. EXAM: MRI HEAD WITHOUT AND WITH CONTRAST TECHNIQUE: Multiplanar, multiecho pulse sequences of the brain and surrounding structures were obtained without and with intravenous contrast. CONTRAST:  9mL GADAVIST GADOBUTROL 1 MMOL/ML IV SOLN COMPARISON:  Head MRI 05/25/2023 FINDINGS: Brain: There is new diffusion weighted signal abnormality and FLAIR hyperintensity involving cerebral sulci bilaterally, greatest near the vertex. There is no significant associated susceptibility to indicate subarachnoid hemorrhage. A small amount of diffusion restricting material is also present in the occipital horns of the lateral ventricles. There is mildly prominent leptomeningeal and pachymeningeal enhancement bilaterally. No sizable acute infarct, mass, midline shift, or extra-axial fluid collection is evident. Cerebral volume is normal with normal size of the ventricles. Scattered small T2 hyperintensities in the cerebral white matter bilaterally are  similar to the prior MRI and are nonspecific but compatible with mild chronic small vessel ischemic disease. Vascular: Major intracranial vascular flow voids are preserved. Skull and upper cervical spine: Unremarkable bone marrow signal. Sinuses/Orbits: Unremarkable orbits. Mild-to-moderate mucosal thickening in the paranasal sinuses. Trace right and small left mastoid effusions. Other: None. These results will be called to the ordering clinician or representative by the Radiologist Assistant, and communication documented in the PACS or [employer]. IMPRESSION: Findings consistent with meningitis. No complicating infarct or abscess. Electronically Signed   By: Aundra Lee M.D.   On: 05/30/2023 20:59      Assessment/Plan:  INTERVAL HISTORY: MRI of the brain showed leptomeningeal enhancement consistent with bacterial meningitis   Principal Problem:   Lumbar discitis Active Problems:   Altered mental status   Acute encephalopathy   Rhinovirus   Septic shock (HCC)   Osteomyelitis of lumbar spine (HCC)   On mechanically assisted ventilation (HCC)   Acute respiratory failure with hypoxia (HCC)   Diskitis    Morgan Morgan is a 73 y.o. female send to the hospital with high fevers over 103 and septic physiology encephalopathy.  There were attempts made to LP but ultimately they were unsuccessful except for one by IR but the sample was not able  to be processed as it was clotted she had MRI of the lumbar supine which shows evidence of lumbar vertebral osteomyelitis discitis at L2-L3 with paraspinous soft tissue enhancement as well.  Been on broad-spectrum antibiotics in the form of vancomycin and ceftriaxone ampicillin and acyclovir  I dc the acyclovir and amp  MRI of the brain now showed new diffusion weighted signal abnormalities and FLAIR hyperintensity along the cerebral sulci bilaterally with findings consistent with meningitis.  There is no abscess however seen.  #1 Meningitis,  Diskitis:  Undoubtedly a bacterial process.  I do not think Listeria is a player here and I had already stopped her ampicillin I would have expected more focal findings on MRI of the brain if she had Listeria infection I would not of expected typically a discitis to be present.  I think currently with her vancomycin and ceftriaxone she is appropriately covered.  #2 GOC: She is going to proceed with one-way extubation on Friday.  If she is not able to handle secretions and would require intubation again plan is to go to comfort care if she is able to successfully maintain her airway we will continue with current therapies.  #3 Rhinovirus: droplet precautions  CRITICAL CARE Performed by: Deberah [place]   Total critical care time: 30 minutes  Critical care time was exclusive of separately billable procedures and treating other patients.  Critical care was necessary to treat or prevent imminent or life-threatening deterioration.  Critical care was time spent personally by me on the following activities: development of treatment plan with patient and/or surrogate as well as nursing, discussions with consultants, evaluation of patient's response to treatment, examination of patient, obtaining history from patient or surrogate, ordering and performing treatments and interventions, ordering and review of laboratory studies, ordering and review of radiographic studies, pulse oximetry and re-evaluation of patient's condition.  Evaluation of the patient requires complex antimicrobial therapy evaluation, counseling , isolation needs to reduce disease transmission and risk assessment and mitigation.       LOS: 7 days   Sheela Denmark 06/01/2023, 3:16 PM

## 2023-06-01 NOTE — Consult Note (Addendum)
 WOC Nurse Consult Note: Reason for Consult: DTPI L shin  Wound type: Deep Tissue Pressure L shin  Pressure Injury POA: yes, noted as blister on 4/10 Measurement: see nursing flowsheet  Wound bed: purple maroon discoloration with lifting of dermis  Drainage (amount, consistency, odor) appears dry  Periwound: intact  Dressing procedure/placement/frequency: Clean L shin wound with NS, apply Xeroform gauze daily Timm Foot [phone]) and cover with silicone foam.  Keep pressure off area.   POC discussed with bedside nurse. WOc team will not follow. Re-consult if further needs arise.    Thank you,    Ronni Colace MSN, RN-BC, [employer] [phone]

## 2023-06-01 NOTE — Progress Notes (Signed)
   NAME:  Morgan Morgan, MRN:  [id number], DOB:  12-Oct-1950, LOS: 7 ADMISSION DATE:  05/25/2023, CONSULTATION DATE:  06/01/2023  REFERRING MD:  Rockne Coons, CHIEF COMPLAINT: Altered mental status  History of Present Illness:  Patient is unresponsive, history obtained via chart review. 73 year old woman BIBEMS as a code stroke after being found down by her grandson.  Initial neuroexam she was obtunded with disconjugate gaze, grimaces to pain, withdrawing both lower extremities and localizing in uppers.  Head CT and CT angio was negative as well as UDS Initial labs significant for mild leukocytosis and thrombocytopenia with lactate of 3.3 and CK of 672 with hypokalemia.  Respiratory viral panel was positive for rhinovirus. She subsequently developed fever She was empirically treated with ceftriaxone, vancomycin and ampicillin with acyclovir Repeat labs showed lactate rising to 4.2, PCCM consulted due to concern for ability to maintain airway EKG showed atrial fibrillation MRI brain was negative  Pertinent  Medical History  Asthma Hypertension  Significant Hospital Events: Including procedures, antibiotic start and stop dates in addition to other pertinent events   4/9 admitted with AMS, worsened overnight requiring intubation and ICU txfr 4/11 remains intubated with mental status precluding extubation, plan for LP with IR today  Interim History / Subjective:  No events. Questionable squeeze R hand last night to command per nursing.  Objective   Blood pressure (!) 107/59, pulse 90, temperature 98.9 F (37.2 C), temperature source Oral, resp. rate (!) 32, height 5\' 1"  (1.549 m), weight 88.9 kg, SpO2 100%. CVP:  [3 mmHg-21 mmHg] 6 mmHg  Vent Mode: PSV;CPAP FiO2 (%):  [40 %] 40 % Set Rate:  [20 bmp] 20 bmp Vt Set:  [380 mL] 380 mL PEEP:  [5 cmH20] 5 cmH20 Pressure Support:  [10 cmH20] 10 cmH20   Intake/Output Summary (Last 24 hours) at 06/01/2023 1104 Last data filed at 06/01/2023  1000 Gross per 24 hour  Intake 1064.49 ml  Output 4240 ml  Net -3175.51 ml   Filed Weights   05/29/23 0244 05/30/23 0135 06/01/23 0421  Weight: 93 kg 93 kg 88.9 kg   Remains off sedation Strong cough Minimal secretions Improving anasarca Abd soft Disconjugate gaze Withdraws but not following commands   Assessment & Plan:  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 LP injury? Severely elevated Pct- improved Afib, Type II MRI, HFrEF- thought to be NOMI process (reactive) Rhinovirus  Remains profoundly encephalopathic- EEG neg for seizures Continue abx as outlined by ID Current cranial nerve deficits (?disconjugate gaze, ?hearing) could be related to meningitis; consider audiology consult depending on clinical trajectory After discussions with family and patients previously stated wishes, think we should do 1-way extubation Friday and see how she does; if does okay work toward rehab/SNF and give time to see if continued improvement; if unable to handle secretions may warrant comfort care; PMT/CCM/family meeting tomorrow to discuss Volume status improved: lasix to daily  Best Practice (right click and "Reselect all SmartList Selections" daily)   Diet/type: TF DVT prophylaxis: heparin Pressure ulcer(s): N/A GI prophylaxis: Pepcid Lines: Central line Foley:  yes and still needed  Code Status:  full code Last date of multidisciplinary goals of care discussion [4/14 see iPAL note]  35 minutes cc time Myrla Halsted MD PCCM

## 2023-06-01 NOTE — Plan of Care (Signed)
  Problem: Education: Goal: Knowledge of General Education information will improve Description: Including pain rating scale, medication(s)/side effects and non-pharmacologic comfort measures Outcome: Not Progressing   Problem: Health Behavior/Discharge Planning: Goal: Ability to manage health-related needs will improve Outcome: Not Progressing   Problem: Clinical Measurements: Goal: Cardiovascular complication will be avoided Outcome: Progressing   Problem: Nutrition: Goal: Adequate nutrition will be maintained Outcome: Progressing   Problem: Elimination: Goal: Will not experience complications related to urinary retention Outcome: Progressing   Problem: Safety: Goal: Ability to remain free from injury will improve Outcome: Progressing   Problem: Activity: Goal: Ability to tolerate increased activity will improve Outcome: Not Progressing   Problem: Role Relationship: Goal: Method of communication will improve Outcome: Not Progressing

## 2023-06-01 NOTE — Progress Notes (Signed)
   Palliative Medicine Inpatient Follow Up Note HPI: 73 year old woman BIBEMS as a code stroke after being found down by her grandson. Workup for encephalopathy is ongoing considerations inclusive of diskitis. Given the lack of neurological improvement since admission the PMT has been asked to have further goals of care conversations.   Today's Discussion 06/01/2023  *Please note that this is a verbal dictation therefore any spelling or grammatical errors are due to the "[employer] One" system interpretation.  Chart reviewed inclusive of vital signs, progress notes, laboratory results, and diagnostic images.   Per secure chat with Dr. Felipe Horton plan for conversations with family tomorrow at 10AM. Likely plan for one way extubation to see how patient does though this will be further confirmed after the meeting.   I met with Analese this morning who is lying in bed somnolent despite lack of sedation. She does withdraw to pain but is not willfully responsive.   I called and spoke with patients sister, Verlyn Goad and confirmed the plan to meet tomorrow at 10AM. Created space and opportunity for Eloise to explore thoughts feelings and fears regarding current medical situation.  Questions and concerns addressed/Palliative Support Provided.   Objective Assessment: Vital Signs Vitals:   06/01/23 0815 06/01/23 0820  BP:  (!) 105/56  Pulse: 71 70  Resp: 16 20  Temp:    SpO2: 96% 96%    Intake/Output Summary (Last 24 hours) at 06/01/2023 0849 Last data filed at 06/01/2023 0800 Gross per 24 hour  Intake 1055.45 ml  Output 4675 ml  Net -3619.55 ml   Last Weight  Most recent update: 06/01/2023  4:21 AM    Weight  88.9 kg (195 lb 15.8 oz)            Gen:  Elderly AA F acutely ill appearing HEENT: ETT, cortrack, dry mucous membranes CV: Regular rate and irregular rhythm  PULM: On mechanical ventilator ABD: soft/nontender  EXT: No edema  Neuro: Somnolent  SUMMARY OF RECOMMENDATIONS   Full  Code/Full Scope of Care --> Will further discuss code status in meeting tomorrow   Discussed patients current condition   Plan for meeting on Thursday at 10AM to guide GOC --> Trach/Peg vs one way extubation and seeing how patient does   Ongoing PMT support --> I will not be present tomorrow though my colleague, Josseline will be at the meeting with Dr. Felipe Horton  Time Spent: 27 ______________________________________________________________________________________ [facility] Palliative Medicine Team Team Cell Phone: [phone] Please utilize secure chat with additional questions, if there is no response within 30 minutes please call the above phone number  Palliative Medicine Team providers are available by phone from 7am to 7pm daily and can be reached through the team cell phone.  Should this patient require assistance outside of these hours, please call the patient's attending physician.

## 2023-06-02 DIAGNOSIS — G9340 Encephalopathy, unspecified: Secondary | ICD-10-CM | POA: Diagnosis not present

## 2023-06-02 DIAGNOSIS — Z7189 Other specified counseling: Secondary | ICD-10-CM | POA: Diagnosis not present

## 2023-06-02 DIAGNOSIS — M4626 Osteomyelitis of vertebra, lumbar region: Secondary | ICD-10-CM | POA: Diagnosis not present

## 2023-06-02 DIAGNOSIS — Z515 Encounter for palliative care: Secondary | ICD-10-CM | POA: Diagnosis not present

## 2023-06-02 DIAGNOSIS — M4646 Discitis, unspecified, lumbar region: Secondary | ICD-10-CM | POA: Diagnosis not present

## 2023-06-02 DIAGNOSIS — I4891 Unspecified atrial fibrillation: Secondary | ICD-10-CM | POA: Diagnosis not present

## 2023-06-02 DIAGNOSIS — R410 Disorientation, unspecified: Secondary | ICD-10-CM | POA: Diagnosis not present

## 2023-06-02 DIAGNOSIS — G009 Bacterial meningitis, unspecified: Secondary | ICD-10-CM | POA: Diagnosis not present

## 2023-06-02 DIAGNOSIS — R4182 Altered mental status, unspecified: Secondary | ICD-10-CM | POA: Diagnosis not present

## 2023-06-02 LAB — FUNGITELL BETA-D-GLUCAN
Fungitell Value:: <31.25 pg/mL
Result Name:: NEGATIVE

## 2023-06-02 LAB — BASIC METABOLIC PANEL WITH GFR
Anion gap: 11 (ref 5–15)
BUN: 34 mg/dL — ABNORMAL HIGH (ref 8–23)
CO2: 34 mmol/L — ABNORMAL HIGH (ref 22–32)
Calcium: 8.4 mg/dL — ABNORMAL LOW (ref 8.9–10.3)
Chloride: 104 mmol/L (ref 98–111)
Creatinine, Ser: 0.79 mg/dL (ref 0.44–1.00)
GFR, Estimated: >60 mL/min (ref >60)
Glucose, Bld: 143 mg/dL — ABNORMAL HIGH (ref 70–99)
Potassium: 3.8 mmol/L (ref 3.5–5.1)
Sodium: 149 mmol/L — ABNORMAL HIGH (ref 135–145)

## 2023-06-02 LAB — GLUCOSE, CAPILLARY
Glucose-Capillary: 112 mg/dL — ABNORMAL HIGH (ref 70–99)
Glucose-Capillary: 119 mg/dL — ABNORMAL HIGH (ref 70–99)
Glucose-Capillary: 122 mg/dL — ABNORMAL HIGH (ref 70–99)
Glucose-Capillary: 137 mg/dL — ABNORMAL HIGH (ref 70–99)
Glucose-Capillary: 140 mg/dL — ABNORMAL HIGH (ref 70–99)
Glucose-Capillary: 147 mg/dL — ABNORMAL HIGH (ref 70–99)
Glucose-Capillary: 98 mg/dL (ref 70–99)

## 2023-06-02 LAB — PHOSPHORUS: Phosphorus: 3.5 mg/dL (ref 2.5–4.6)

## 2023-06-02 LAB — MAGNESIUM: Magnesium: 2.5 mg/dL — ABNORMAL HIGH (ref 1.7–2.4)

## 2023-06-02 LAB — HEPARIN LEVEL (UNFRACTIONATED): Heparin Unfractionated: 0.68 [IU]/mL (ref 0.30–0.70)

## 2023-06-02 MED ORDER — POTASSIUM CHLORIDE 20 MEQ PO PACK
40.0000 meq | PACK | Freq: Once | ORAL | Status: AC
Start: 2023-06-02 — End: 2023-06-02
  Administered 2023-06-02: 40 meq
  Filled 2023-06-02: qty 2

## 2023-06-02 MED ORDER — ORAL CARE MOUTH RINSE: 15.0000 mL | OROMUCOSAL | Status: DC | PRN

## 2023-06-02 MED ORDER — LORAZEPAM 1 MG PO TABS
1.0000 mg | ORAL_TABLET | Freq: Four times a day (QID) | ORAL | Status: DC | PRN
  Administered 2023-06-02: 1 mg
  Filled 2023-06-02: qty 1

## 2023-06-02 MED ORDER — FREE WATER
200.0000 mL | Freq: Four times a day (QID) | Status: DC
  Administered 2023-06-02 – 2023-06-03 (×4): 200 mL

## 2023-06-02 MED ORDER — ORAL CARE MOUTH RINSE
15.0000 mL | OROMUCOSAL | Status: DC
  Administered 2023-06-02 – 2023-06-10 (×22): 15 mL via OROMUCOSAL

## 2023-06-02 NOTE — Progress Notes (Signed)
 [facility]  Date of Admission:  05/25/2023      Total days of antibiotics 9          ASSESSMENT: Morgan Morgan is a 73 y.o. female admitted from home with:   Fever -  Encephalopathy -  ?Contiguous Meningitis -  Seems to be improving. Will plan for CNS dosing for 2 weeks (4/23) then lower dose to complete 6 total weeks of antibiotics through 5/21  L2-L3 Osteomyelitis/Discitis  No culture data to go off of. Not bacteremic. LP unable to be successfully run d/t clotting.  On empiric therapy with Vancomycin + Ceftriaxone (CNS dosing through 4/22)   Rhinovirus  Continue droplet precautions for now Extubated and maintaining airway.   Medication Monitoring -  Continue vancomycin monitoring, creatinine follow up.   Fever -  100.2 degrees yesterday. Will place picc tomorrow 4/18 if no fevers o/n.    PLAN: Continue vancomycin + BID Ceftriaxone  Follow up vanc safety labs  Needs CNS dosing through 4/23 then can lower to convention dose for discitis treatment through 5/21-- may be prudent to switch vanc to dapto at this time also for better safety profile.     Principal Problem:   Lumbar discitis Active Problems:   Altered mental status   Acute encephalopathy   Rhinovirus   Septic shock (HCC)   Osteomyelitis of lumbar spine (HCC)   On mechanically assisted ventilation (HCC)   Acute respiratory failure with hypoxia (HCC)   Diskitis   Bacterial meningitis    arformoterol  15 mcg Nebulization BID   budesonide (PULMICORT) nebulizer solution  0.25 mg Nebulization BID   Chlorhexidine Gluconate Cloth  6 each Topical Daily   famotidine  20 mg Per Tube BID   feeding supplement (PROSource TF20)  60 mL Per Tube BID   free water  200 mL Per Tube Q6H   insulin aspart  0-9 Units Subcutaneous Q4H   mouth rinse  15 mL Mouth Rinse Q2H   polyethylene glycol  17 g Per Tube Daily   thiamine (VITAMIN B1) injection  100 mg Intravenous Daily     SUBJECTIVE: Extubated. Family at the bedside.   Review of Systems: ROS  Allergies  Allergen Reactions   Zithromax [Azithromycin] Itching    OBJECTIVE: Vitals:   06/02/23 1100 06/02/23 1128 06/02/23 1130 06/02/23 1200  BP: 94/82   (!) 111/58  Pulse: 73  84 86  Resp: 13  18 19   Temp:  98.8 F (37.1 C)    TempSrc:  Oral    SpO2: 100%  99% 100%  Weight:      Height:       Body mass index is 36.03 kg/m.  Physical Exam Constitutional:      Comments: Awakens to voice and tracks with eyes. Responds but slow and short words   Cardiovascular:     Rate and Rhythm: Normal rate.  Pulmonary:     Effort: Pulmonary effort is normal.     Breath sounds: Normal breath sounds.  Abdominal:     General: Bowel sounds are normal. There is no distension.  Skin:    General: Skin is warm and dry.     Lab Results Lab Results  Component Value Date   WBC 13.7 (H) 06/01/2023   HGB 10.1 (L) 06/01/2023   HCT 30.4 (L) 06/01/2023   MCV 87.4 06/01/2023   PLT 286 06/01/2023    Lab Results  Component Value Date  CREATININE 0.79 06/02/2023   BUN 34 (H) 06/02/2023   NA 149 (H) 06/02/2023   K 3.8 06/02/2023   CL 104 06/02/2023   CO2 34 (H) 06/02/2023    Lab Results  Component Value Date   ALT 15 05/27/2023   AST 30 05/27/2023   ALKPHOS 70 05/27/2023   BILITOT 0.5 05/27/2023     Microbiology: Recent Results (from the past 240 hours)  Resp panel by RT-PCR (RSV, Flu A&B, Covid) Anterior Nasal Swab     Status: None   Collection Time: 05/25/23  5:50 PM   Specimen: Anterior Nasal Swab  Result Value Ref Range Status   SARS Coronavirus 2 by RT PCR NEGATIVE NEGATIVE Final   Influenza A by PCR NEGATIVE NEGATIVE Final   Influenza B by PCR NEGATIVE NEGATIVE Final    Comment: (NOTE) The Xpert Xpress SARS-CoV-2/FLU/RSV plus assay is intended as an aid in the diagnosis of influenza from Nasopharyngeal swab specimens and should not be used as a sole basis for treatment. Nasal washings  and aspirates are unacceptable for Xpert Xpress SARS-CoV-2/FLU/RSV testing.  Fact Sheet for Patients: BloggerCourse.com  Fact Sheet for Healthcare Providers: SeriousBroker.it  This test is not yet approved or cleared by the [place] FDA and has been authorized for detection and/or diagnosis of SARS-CoV-2 by FDA under an Emergency Use Authorization (EUA). This EUA will remain in effect (meaning this test can be used) for the duration of the COVID-19 declaration under Section 564(b)(1) of the Act, 21 U.S.C. section 360bbb-3(b)(1), unless the authorization is terminated or revoked.     Resp Syncytial Virus by PCR NEGATIVE NEGATIVE Final    Comment: (NOTE) Fact Sheet for Patients: BloggerCourse.com  Fact Sheet for Healthcare Providers: SeriousBroker.it  This test is not yet approved or cleared by the [place] FDA and has been authorized for detection and/or diagnosis of SARS-CoV-2 by FDA under an Emergency Use Authorization (EUA). This EUA will remain in effect (meaning this test can be used) for the duration of the COVID-19 declaration under Section 564(b)(1) of the Act, 21 U.S.C. section 360bbb-3(b)(1), unless the authorization is terminated or revoked.  Performed at [facility], [address], [place], [place] [address]   Respiratory (~20 pathogens) panel by PCR     Status: Abnormal   Collection Time: 05/25/23  5:50 PM   Specimen: Anterior Nasal Swab; Respiratory  Result Value Ref Range Status   Adenovirus NOT DETECTED NOT DETECTED Final   Coronavirus 229E NOT DETECTED NOT DETECTED Final    Comment: (NOTE) The Coronavirus on the Respiratory Panel, DOES NOT test for the novel  Coronavirus (2019 nCoV)    Coronavirus HKU1 NOT DETECTED NOT DETECTED Final   Coronavirus NL63 NOT DETECTED NOT DETECTED Final   Coronavirus OC43 NOT DETECTED NOT DETECTED Final    Metapneumovirus NOT DETECTED NOT DETECTED Final   Rhinovirus / Enterovirus DETECTED (A) NOT DETECTED Final   Influenza A NOT DETECTED NOT DETECTED Final   Influenza B NOT DETECTED NOT DETECTED Final   Parainfluenza Virus 1 NOT DETECTED NOT DETECTED Final   Parainfluenza Virus 2 NOT DETECTED NOT DETECTED Final   Parainfluenza Virus 3 NOT DETECTED NOT DETECTED Final   Parainfluenza Virus 4 NOT DETECTED NOT DETECTED Final   Respiratory Syncytial Virus NOT DETECTED NOT DETECTED Final   Bordetella pertussis NOT DETECTED NOT DETECTED Final   Bordetella Parapertussis NOT DETECTED NOT DETECTED Final   Chlamydophila pneumoniae NOT DETECTED NOT DETECTED Final   Mycoplasma pneumoniae NOT DETECTED  NOT DETECTED Final    Comment: Performed at [facility], [address], [place], [place] [address]  MRSA Next Gen by PCR, Nasal     Status: None   Collection Time: 05/26/23  3:20 AM   Specimen: Nasal Mucosa; Nasal Swab  Result Value Ref Range Status   MRSA by PCR Next Gen NOT DETECTED NOT DETECTED Final    Comment: (NOTE) The GeneXpert MRSA Assay (FDA approved for NASAL specimens only), is one component of a comprehensive MRSA colonization surveillance program. It is not intended to diagnose MRSA infection nor to guide or monitor treatment for MRSA infections. Test performance is not FDA approved in patients less than 87 years old. Performed at [facility], [address], [place], [place] [address]   Culture, blood (Routine X 2) w Reflex to ID Panel     Status: None   Collection Time: 05/26/23  5:46 AM   Specimen: BLOOD LEFT HAND  Result Value Ref Range Status   Specimen Description BLOOD LEFT HAND  Final   Special Requests   Final    BOTTLES DRAWN AEROBIC AND ANAEROBIC Blood Culture adequate volume   Culture   Final    NO GROWTH 5 DAYS Performed at [facility], [address] [place], [place] [address]    Report Status 05/31/2023 FINAL  Final  Culture, blood (Routine X  2) w Reflex to ID Panel     Status: None   Collection Time: 05/26/23 10:06 AM   Specimen: BLOOD RIGHT HAND  Result Value Ref Range Status   Specimen Description BLOOD RIGHT HAND  Final   Special Requests   Final    AEROBIC BOTTLE ONLY Blood Culture results may not be optimal due to an inadequate volume of blood received in culture bottles   Culture   Final    NO GROWTH 5 DAYS Performed at [facility], [address], [place], [place] [address]    Report Status 05/31/2023 FINAL  Final     Gibson Kurtz, MSN, NP-C [facility]  [email or web address] Pager: [phone] Office: [phone] [facility] Main Line: [phone] *Secure Chat Communication Welcome

## 2023-06-02 NOTE — Progress Notes (Signed)
   06/02/23 0744  Adult Ventilator Settings  Vent Mode PSV;CPAP  FiO2 (%) 40 %  Pressure Support 10 cmH20  PEEP 5 cmH20   Pt was placed on PS/CPAP 10/5 and is tolerating well. Pt placed initially on 8/5 with MVe of 4.6.

## 2023-06-02 NOTE — Procedures (Signed)
 Extubation Procedure Note  Patient Details:   Name: Morgan Morgan DOB: 1950/02/19 MRN: [id number]   Airway Documentation:    Vent end date: 06/02/23 Vent end time: 1100   Evaluation  O2 sats: stable throughout Complications: No apparent complications Patient did tolerate procedure well. Bilateral Breath Sounds: Clear, Diminished   No Pt is not able to speak at this time but is able to protect airway. Audible cuffleak heard prior extubation with Vte loss, no signs of stridor at this time. Pt was placed on 6L [place] and is VSS at this time.   Lorina Roosevelt 06/02/2023, 11:12 AM

## 2023-06-02 NOTE — Progress Notes (Addendum)
 eLink Physician-Brief Progress Note Patient Name: Shanena Pellegrino DOB: June 09, 1950 MRN: [id number]   Date of Service  06/02/2023  HPI/Events of Note  73 year old woman BIBEMS as a code stroke after being found down by her grandson.  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  Saturating well on 4 L of nasal cannula.  No tachypnea or respiratory distress  eICU Interventions  Add low-dose Ativan  per tube every 6 hours as needed for anxiety   0236 -   Patient had a run of nonsustained ventricular tachycardia.  Will repeat labs this morning.  No immediate intervention.  Asymptomatic.  Intervention Category Intermediate Interventions: Respiratory distress - evaluation and management Minor Interventions: Agitation / anxiety - evaluation and management  Kale Rondeau 06/02/2023, 10:47 PM

## 2023-06-02 NOTE — Evaluation (Signed)
 Clinical/Bedside Swallow Evaluation Patient Details  Name: Morgan Morgan MRN: [id number] Date of Birth: 13-Mar-1950  Today's Date: 06/02/2023 Time: SLP Start Time (ACUTE ONLY): 1453 SLP Stop Time (ACUTE ONLY): 1503 SLP Time Calculation (min) (ACUTE ONLY): 10 min  Past Medical History:  Past Medical History:  Diagnosis Date   Asthma    Hypertension    Past Surgical History:  Past Surgical History:  Procedure Laterality Date   ABDOMINAL HYSTERECTOMY     IR LUMBAR PUNCTURE  05/28/2023   HPI:  73 year old woman presented via EMS on 4/9 as a code stroke after being found down by her grandson.  Intubated 4/9-4/17.  Head CT and CT angio was negative as well as UDS. Developed fever, MS with no improvement. Palliative care consulted.  Pt with improved MS 4/17; extubated, able to follow commands intermittently.    Assessment / Plan / Recommendation  Clinical Impression  Pt participated in preliminary clinical swallow assessment after being extubated today. She was alert, followed all commands for oral mechanism exam. No focal deficits. Voice is aphonic - she articulated name/month without engaging voice.  Trials of ice chips, small sips of water led to coughing, suggesting aspiration, not surprising given deconditioned state and eight-day intubation. Recommend continuing NPO for now; pt has cortrak. SLP will follow for PO readiness, instrumental study.  D/W RN. SLP Visit Diagnosis: Dysphagia, oropharyngeal phase (R13.12)    Aspiration Risk    unknown   Diet Recommendation   NPO  Medication Administration: Via alternative means    Other  Recommendations Oral Care Recommendations: Oral care QID    Recommendations for follow up therapy are one component of a multi-disciplinary discharge planning process, led by the attending physician.  Recommendations may be updated based on patient status, additional functional criteria and insurance authorization.  Follow up Recommendations Other (comment)  (tba)      Assistance Recommended at Discharge    Functional Status Assessment Patient has had a recent decline in their functional status and demonstrates the ability to make significant improvements in function in a reasonable and predictable amount of time.  Frequency and Duration min 2x/week  2 weeks       Prognosis Prognosis for improved oropharyngeal function: Good      Swallow Study   General Date of Onset: 05/25/23 HPI: 73 year old woman presented via EMS on 4/9 as a code stroke after being found down by her grandson.  Intubated 4/9-4/17.  Head CT and CT angio was negative as well as UDS. Developed fever, MS with no improvement. Palliative care consulted.  Pt with improved MS 4/17; extubated, able to follow commands intermittently. Type of Study: Bedside Swallow Evaluation Previous Swallow Assessment: no Diet Prior to this Study: NPO;Cortrak/Small bore NG tube Temperature Spikes Noted: No Respiratory Status: Nasal cannula (6L) History of Recent Intubation: Yes Total duration of intubation (days): 8 days Date extubated: 06/02/23 Behavior/Cognition: Alert Oral Cavity Assessment: Within Functional Limits Oral Care Completed by SLP: Yes Oral Cavity - Dentition: Adequate natural dentition Self-Feeding Abilities: Total assist Patient Positioning: Upright in bed Baseline Vocal Quality: Aphonic Volitional Cough: Weak Volitional Swallow: Able to elicit    Oral/Motor/Sensory Function Overall Oral Motor/Sensory Function: Within functional limits   Ice Chips Ice chips: Impaired Presentation: Spoon Pharyngeal Phase Impairments: Cough - Immediate   Thin Liquid Thin Liquid: Impaired Presentation: Spoon Pharyngeal  Phase Impairments: Cough - Immediate    Nectar Thick Nectar Thick Liquid: Not tested   Honey Thick Honey Thick Liquid: Not tested   Puree  Puree: Not tested   Solid     Solid: Not tested      Morgan Morgan Morgan Morgan 06/02/2023,3:44 PM Morgan Asa L. Morgan [place], MA  CCC/SLP Clinical Specialist - Acute Care SLP Acute Rehabilitation Services Office number [phone]

## 2023-06-02 NOTE — Progress Notes (Signed)
   NAME:  Morgan Morgan, MRN:  [id number], DOB:  1950-06-24, LOS: 8 ADMISSION DATE:  05/25/2023, CONSULTATION DATE:  06/02/2023  REFERRING MD:  Sonia Durand, CHIEF COMPLAINT: Altered mental status  History of Present Illness:  Patient is unresponsive, history obtained via chart review. 73 year old woman BIBEMS as a code stroke after being found down by her grandson.  Initial neuroexam she was obtunded with disconjugate gaze, grimaces to pain, withdrawing both lower extremities and localizing in uppers.  Head CT and CT angio was negative as well as UDS Initial labs significant for mild leukocytosis and thrombocytopenia with lactate of 3.3 and CK of 672 with hypokalemia.  Respiratory viral panel was positive for rhinovirus. She subsequently developed fever She was empirically treated with ceftriaxone, vancomycin and ampicillin with acyclovir Repeat labs showed lactate rising to 4.2, PCCM consulted due to concern for ability to maintain airway EKG showed atrial fibrillation MRI brain was negative  Pertinent  Medical History  Asthma Hypertension  Significant Hospital Events: Including procedures, antibiotic start and stop dates in addition to other pertinent events   4/9 admitted with AMS, worsened overnight requiring intubation and ICU txfr 4/11 remains intubated with mental status precluding extubation, plan for LP with IR today  Interim History / Subjective:  She's awake! Tracking, intermittent following commands  Objective   Blood pressure 106/65, pulse 78, temperature 98.8 F (37.1 C), temperature source Oral, resp. rate 19, height 5\' 1"  (1.549 m), weight 86.5 kg, SpO2 100%. CVP:  [0 mmHg-13 mmHg] 10 mmHg  Vent Mode: PSV;CPAP FiO2 (%):  [40 %] 40 % Set Rate:  [20 bmp] 20 bmp Vt Set:  [380 mL] 380 mL PEEP:  [5 cmH20] 5 cmH20 Pressure Support:  [10 cmH20] 10 cmH20 Plateau Pressure:  [11 cmH20-12 cmH20] 12 cmH20   Intake/Output Summary (Last 24 hours) at 06/02/2023 0936 Last data  filed at 06/02/2023 0700 Gross per 24 hour  Intake 1143.34 ml  Output 1415 ml  Net -271.66 ml   Filed Weights   05/30/23 0135 06/01/23 0421 06/02/23 0446  Weight: 93 kg 88.9 kg 86.5 kg   Tracking +secretions mostly oral Rhonci, doing well on PS Moves upper purposefully, withdraws lowers Abd soft, +BS Minimal edema  K repleted Sodium, HCO3 rising  Assessment & Plan:  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 LP injury? Severely elevated Pct- improved Afib, Type II MRI, HFrEF- thought to be NOMI process (reactive) Rhinovirus  She's more awake! Hold further diuresis, replete K, start FWF May try extubation later today after family talks Will need extensive rehab   Best Practice (right click and "Reselect all SmartList Selections" daily)   Diet/type: TF DVT prophylaxis: heparin Pressure ulcer(s): N/A GI prophylaxis: Pepcid Lines: Central line Foley:  yes and still needed  Code Status:  full code Last date of multidisciplinary goals of care discussion [4/14 see iPAL note]  31 minutes cc time Ardelle Kos MD PCCM

## 2023-06-02 NOTE — Progress Notes (Signed)
 PHARMACY - ANTICOAGULATION CONSULT NOTE  Pharmacy Consult for heparin Indication: atrial fibrillation  Allergies  Allergen Reactions   Zithromax [Azithromycin] Itching    Patient Measurements: Height: 5\' 1"  (154.9 cm) Weight: 86.5 kg (190 lb 11.2 oz) IBW/kg (Calculated) : 47.8 HEPARIN DW (KG): 68.3  Vital Signs: Temp: 99.7 F (37.6 C) (04/17 0345) Temp Source: Axillary (04/17 0345) BP: 109/53 (04/17 0600) Pulse Rate: 73 (04/17 0600)  Labs: Recent Labs    05/31/23 0305 06/01/23 0401 06/01/23 1125 06/02/23 0411  HGB 10.0* 10.1*  --   --   HCT 30.5* 30.4*  --   --   PLT 240 286  --   --   HEPARINUNFRC 0.56 0.84* 0.63 0.68  CREATININE 0.85 0.79  --  0.79    Estimated Creatinine Clearance: 62.6 mL/min (by C-G formula based on SCr of 0.79 mg/dL).   Medical History: Past Medical History:  Diagnosis Date   Asthma    Hypertension     Medications:  Scheduled:   arformoterol  15 mcg Nebulization BID   budesonide (PULMICORT) nebulizer solution  0.25 mg Nebulization BID   Chlorhexidine Gluconate Cloth  6 each Topical Daily   famotidine  20 mg Per Tube BID   feeding supplement (PROSource TF20)  60 mL Per Tube BID   furosemide  40 mg Intravenous Daily   insulin aspart  0-9 Units Subcutaneous Q4H   midazolam  4 mg Intravenous Once   mouth rinse  15 mL Mouth Rinse Q2H   polyethylene glycol  17 g Per Tube Daily   thiamine (VITAMIN B1) injection  100 mg Intravenous Daily    Assessment: 72 yom presenting with AMS. No AC PTA. Found to be in Afib this admission, heparin per pharmacy ordered.  Heparin level this morning came back therapeutic at 0.68, on 1600 units/hr. No s/sx of bleeding or infusion issues. RN noted what looked like old oozing at IV site.  Goal of Therapy:  Heparin level 0.3-0.7 units/ml Monitor platelets by anticoagulation protocol: Yes   Plan:  -Continue hepatin at 1850 units/hr -Heparin level and CBC daily  Thank you for allowing pharmacy to  participate in this patient's care,  Juleen Oakland, PharmD PGY1 Pharmacy Resident 06/02/2023 6:14 AM

## 2023-06-02 NOTE — Plan of Care (Signed)
  Problem: Clinical Measurements: Goal: Ability to maintain clinical measurements within normal limits will improve Outcome: Progressing   Problem: Nutrition: Goal: Adequate nutrition will be maintained Outcome: Progressing   Problem: Elimination: Goal: Will not experience complications related to bowel motility Outcome: Progressing Goal: Will not experience complications related to urinary retention Outcome: Progressing   Problem: Metabolic: Goal: Ability to maintain appropriate glucose levels will improve Outcome: Progressing   Problem: Education: Goal: Knowledge of General Education information will improve Description: Including pain rating scale, medication(s)/side effects and non-pharmacologic comfort measures Outcome: Not Progressing   Problem: Health Behavior/Discharge Planning: Goal: Ability to manage health-related needs will improve Outcome: Not Progressing   Problem: Clinical Measurements: Goal: Will remain free from infection Outcome: Not Progressing   Problem: Activity: Goal: Risk for activity intolerance will decrease Outcome: Not Progressing   Problem: Role Relationship: Goal: Method of communication will improve Outcome: Not Progressing

## 2023-06-02 NOTE — Progress Notes (Signed)
 Palliative Medicine Inpatient Follow Up Note HPI: 73 year old woman BIBEMS as a code stroke after being found down by her grandson. Workup for encephalopathy is ongoing considerations inclusive of diskitis. Given the lack of neurological improvement since admission the PMT has been asked to have further goals of care conversations.   Today's Discussion 06/02/2023  *Please note that this is a verbal dictation therefore any spelling or grammatical errors are due to the "Dragon Medical One" system interpretation.  Chart reviewed inclusive of vital signs, progress notes, laboratory results, and diagnostic images. Patient assessed at the bedside and updates received from RN. She is more alert today, though did not arouse to this PA's voice. Relocated to [facility] conference room for scheduled family meeting. Met with her brother, sister, and 2 nieces. Son is incarcerated with intention to call in to participate, though he ultimately did not.  Created space and opportunity for family's thoughts and feelings on patient's current illness. Reviewed course of hospitalization and most recent labs, changes in treatment plan including antibiotics. Family is encouraged by her progress yesterday, as she seemed to be more interactive. They are very glad to hear that she has made even more progress today, now following commands and tracking. They would like to give her more time to recover given that her baseline functioning and quality of life were going well.   Reviewed possible medical decisions to be made and complications that could arise during the recovery process. Risks and benefits of interventions such as tracheostomy and PEG were discussed. Family are all in agreement that she would never want either of these interventions for herself. She is strong and a IT sales professional. They are open to re-intubation if she needs additional time on the ventilator. We reviewed typical disease trajectory and options for rehab if she  continues to improve. Patient would likely not want LTC facility.  Recommended consideration of DNR status, understanding evidenced-based poor outcomes in similar hospitalized patients, as the cause of the arrest is likely associated with chronic/terminal disease rather than a reversible acute cardio-pulmonary event. They are concerned about the possibility of causing more suffering, but for now will elect to do 1 attempt of CPR if necessary and continue the conversation based on the cause of arrest.   Described what comfort-focused care would entail. Patient would no longer receive aggressive medical interventions such as continuous vital signs, lab work, radiology testing, or medications not focused on comfort. All care would focus on how the patient is looking and feeling. This would include management of any symptoms that may cause discomfort, pain, shortness of breath, cough, nausea, agitation, anxiety, and/or secretions etc. Symptoms would be managed with medications and other non-pharmacological interventions such as spiritual support if requested, repositioning, music therapy, or therapeutic listening. Family verbalized understanding and appreciation. They are hoping it will not come to this, though if she declines drastically they would prefer to keep her comfortable in the hospital rather than hospice. Eloise dislikes hospice strongly, stating that "they won't feed you."  We were then joined by CCM MD who provided update as well.  Questions and concerns addressed/Palliative Support Provided.   Objective Assessment: Vital Signs Vitals:   06/02/23 0700 06/02/23 0750  BP: 106/65   Pulse: 78   Resp: 19   Temp:  98.8 F (37.1 C)  SpO2: 100%     Gen:  Elderly AA F acutely ill appearing HEENT: ETT, cortrack, dry mucous membranes CV: Regular rate and irregular rhythm  PULM: On mechanical ventilator ABD: soft/nontender  EXT: No edema  Neuro: sleeping  SUMMARY OF RECOMMENDATIONS   -Full  Code/Full Scope of Care -Family agreeable to re-intubation if necessary given improvement in her mental status today -NO trach/PEG -Ongoing GOC discussions, allow more time for outcomes -Psychosocial and emotional support provided -PMT will continue to follow and support  Time Spent: 70 min  ______________________________________________________________________________________ Audie Leander, PA-C [facility] Palliative Medicine Team Team Cell Phone: [phone] Please utilize secure chat with additional questions, if there is no response within 30 minutes please call the above phone number  Palliative Medicine Team providers are available by phone from 7am to 7pm daily and can be reached through the team cell phone.  Should this patient require assistance outside of these hours, please call the patient's attending physician.

## 2023-06-03 ENCOUNTER — Other Ambulatory Visit: Payer: Self-pay

## 2023-06-03 DIAGNOSIS — I491 Atrial premature depolarization: Secondary | ICD-10-CM

## 2023-06-03 DIAGNOSIS — I4891 Unspecified atrial fibrillation: Secondary | ICD-10-CM | POA: Diagnosis not present

## 2023-06-03 DIAGNOSIS — G009 Bacterial meningitis, unspecified: Secondary | ICD-10-CM | POA: Diagnosis not present

## 2023-06-03 DIAGNOSIS — M4646 Discitis, unspecified, lumbar region: Secondary | ICD-10-CM | POA: Diagnosis not present

## 2023-06-03 DIAGNOSIS — I5021 Acute systolic (congestive) heart failure: Secondary | ICD-10-CM | POA: Diagnosis not present

## 2023-06-03 DIAGNOSIS — G9340 Encephalopathy, unspecified: Secondary | ICD-10-CM | POA: Diagnosis not present

## 2023-06-03 DIAGNOSIS — A419 Sepsis, unspecified organism: Secondary | ICD-10-CM | POA: Diagnosis not present

## 2023-06-03 LAB — CBC
HCT: 32.4 % — ABNORMAL LOW (ref 36.0–46.0)
Hemoglobin: 10.1 g/dL — ABNORMAL LOW (ref 12.0–15.0)
MCH: 27.7 pg (ref 26.0–34.0)
MCHC: 31.2 g/dL (ref 30.0–36.0)
MCV: 88.8 fL (ref 80.0–100.0)
Platelets: 401 10*3/uL — ABNORMAL HIGH (ref 150–400)
RBC: 3.65 MIL/uL — ABNORMAL LOW (ref 3.87–5.11)
RDW: 16.4 % — ABNORMAL HIGH (ref 11.5–15.5)
WBC: 14.2 10*3/uL — ABNORMAL HIGH (ref 4.0–10.5)
nRBC: 0.6 % — ABNORMAL HIGH (ref 0.0–0.2)

## 2023-06-03 LAB — BASIC METABOLIC PANEL WITH GFR
Anion gap: 6 (ref 5–15)
BUN: 34 mg/dL — ABNORMAL HIGH (ref 8–23)
CO2: 32 mmol/L (ref 22–32)
Calcium: 8.6 mg/dL — ABNORMAL LOW (ref 8.9–10.3)
Chloride: 110 mmol/L (ref 98–111)
Creatinine, Ser: 0.64 mg/dL (ref 0.44–1.00)
GFR, Estimated: >60 mL/min (ref >60)
Glucose, Bld: 148 mg/dL — ABNORMAL HIGH (ref 70–99)
Potassium: 3.8 mmol/L (ref 3.5–5.1)
Sodium: 148 mmol/L — ABNORMAL HIGH (ref 135–145)

## 2023-06-03 LAB — HEPARIN LEVEL (UNFRACTIONATED): Heparin Unfractionated: 0.75 [IU]/mL — ABNORMAL HIGH (ref 0.30–0.70)

## 2023-06-03 LAB — MAGNESIUM: Magnesium: 2.6 mg/dL — ABNORMAL HIGH (ref 1.7–2.4)

## 2023-06-03 LAB — GLUCOSE, CAPILLARY
Glucose-Capillary: 108 mg/dL — ABNORMAL HIGH (ref 70–99)
Glucose-Capillary: 121 mg/dL — ABNORMAL HIGH (ref 70–99)
Glucose-Capillary: 65 mg/dL — ABNORMAL LOW (ref 70–99)
Glucose-Capillary: 86 mg/dL (ref 70–99)
Glucose-Capillary: 91 mg/dL (ref 70–99)

## 2023-06-03 LAB — PHOSPHORUS: Phosphorus: 3.3 mg/dL (ref 2.5–4.6)

## 2023-06-03 MED ORDER — POLYETHYLENE GLYCOL 3350 17 G PO PACK
17.0000 g | PACK | Freq: Every day | ORAL | Status: DC | PRN
  Filled 2023-06-03: qty 1

## 2023-06-03 MED ORDER — SPIRONOLACTONE 12.5 MG HALF TABLET
12.5000 mg | ORAL_TABLET | Freq: Every day | ORAL | Status: DC
  Administered 2023-06-03 – 2023-06-08 (×6): 12.5 mg via ORAL
  Filled 2023-06-03 (×6): qty 1

## 2023-06-03 MED ORDER — OLANZAPINE 10 MG IM SOLR
5.0000 mg | Freq: Once | INTRAMUSCULAR | Status: AC
  Administered 2023-06-03: 5 mg via INTRAVENOUS
  Filled 2023-06-03: qty 10

## 2023-06-03 MED ORDER — FREE WATER
200.0000 mL | Status: DC
  Administered 2023-06-03 (×3): 200 mL

## 2023-06-03 MED ORDER — STERILE WATER FOR INJECTION IJ SOLN
INTRAMUSCULAR | Status: AC
  Administered 2023-06-03: 10 mL
  Filled 2023-06-03: qty 10

## 2023-06-03 MED ORDER — AMIODARONE HCL 200 MG PO TABS
200.0000 mg | ORAL_TABLET | Freq: Two times a day (BID) | ORAL | Status: DC
  Administered 2023-06-03: 200 mg via ORAL
  Filled 2023-06-03 (×2): qty 1

## 2023-06-03 MED ORDER — POTASSIUM CHLORIDE 20 MEQ PO PACK
20.0000 meq | PACK | Freq: Once | ORAL | Status: AC
  Administered 2023-06-03: 20 meq
  Filled 2023-06-03: qty 1

## 2023-06-03 MED ORDER — HALOPERIDOL LACTATE 5 MG/ML IJ SOLN
5.0000 mg | Freq: Once | INTRAMUSCULAR | Status: AC
  Administered 2023-06-03: 5 mg via INTRAVENOUS
  Filled 2023-06-03: qty 1

## 2023-06-03 MED ORDER — DEXTROSE 50 % IV SOLN
12.5000 g | INTRAVENOUS | Status: AC
  Administered 2023-06-04: 12.5 g via INTRAVENOUS
  Filled 2023-06-03: qty 50

## 2023-06-03 MED ORDER — APIXABAN 5 MG PO TABS
5.0000 mg | ORAL_TABLET | Freq: Two times a day (BID) | ORAL | Status: DC
  Filled 2023-06-03: qty 1

## 2023-06-03 MED ORDER — APIXABAN 5 MG PO TABS
5.0000 mg | ORAL_TABLET | Freq: Two times a day (BID) | ORAL | Status: DC
  Administered 2023-06-03: 5 mg via ORAL
  Filled 2023-06-03: qty 1

## 2023-06-03 MED ORDER — METOPROLOL SUCCINATE ER 25 MG PO TB24: 12.5000 mg | ORAL_TABLET | Freq: Every day | ORAL | Status: DC

## 2023-06-03 MED ORDER — SODIUM CHLORIDE 0.9 % IV SOLN
2.0000 g | INTRAVENOUS | Status: DC
  Administered 2023-06-08 – 2023-06-10 (×3): 2 g via INTRAVENOUS
  Filled 2023-06-03 (×3): qty 20

## 2023-06-03 MED ORDER — DAPTOMYCIN-SODIUM CHLORIDE 500-0.9 MG/50ML-% IV SOLN
8.0000 mg/kg | Freq: Every day | INTRAVENOUS | Status: DC
  Administered 2023-06-08 – 2023-06-10 (×3): 500 mg via INTRAVENOUS
  Filled 2023-06-03 (×3): qty 50

## 2023-06-03 NOTE — Progress Notes (Signed)
 Internal Medicine Teaching Service to assume care on 06/04/23 at 7:00 AM  Kiarra Kidd, MD  [facility] Internal Medicine Residency

## 2023-06-03 NOTE — Progress Notes (Signed)
 Subjective:  Extubated being cleaned   Antibiotics:  Anti-infectives (From admission, onward)    Start     Dose/Rate Route Frequency Ordered Stop   06/08/23 1400  DAPTOmycin  (CUBICIN ) IVPB 500 mg/50mL premix        8 mg/kg  64.7 kg (Adjusted) 100 mL/hr over 30 Minutes Intravenous Daily 06/03/23 0751 07/06/23 1359   06/08/23 1000  cefTRIAXone  (ROCEPHIN ) 2 g in sodium chloride  0.9 % 100 mL IVPB        2 g 200 mL/hr over 30 Minutes Intravenous Every 24 hours 06/03/23 0751 07/06/23 0959   05/28/23 1400  acyclovir  (ZOVIRAX ) 700 mg in dextrose  5 % 100 mL IVPB  Status:  Discontinued        700 mg 114 mL/hr over 60 Minutes Intravenous Every 8 hours 05/28/23 1114 05/30/23 1100   05/28/23 1200  ampicillin  (OMNIPEN) 2 g in sodium chloride  0.9 % 100 mL IVPB  Status:  Discontinued        2 g 300 mL/hr over 20 Minutes Intravenous Every 4 hours 05/28/23 1114 05/30/23 1529   05/27/23 1930  vancomycin  (VANCOREADY) IVPB 750 mg/150 mL        750 mg 150 mL/hr over 60 Minutes Intravenous Every 12 hours 05/27/23 1827 06/07/23 2359   05/27/23 1345  ampicillin  (OMNIPEN) 2 g in sodium chloride  0.9 % 100 mL IVPB  Status:  Discontinued        2 g 300 mL/hr over 20 Minutes Intravenous Every 6 hours 05/27/23 1343 05/28/23 1114   05/26/23 0900  vancomycin  (VANCOREADY) IVPB 2000 mg/400 mL  Status:  Discontinued        2,000 mg 200 mL/hr over 120 Minutes Intravenous  Once 05/25/23 1921 05/25/23 1943   05/26/23 0800  cefTRIAXone  (ROCEPHIN ) 2 g in sodium chloride  0.9 % 100 mL IVPB  Status:  Discontinued        2 g 200 mL/hr over 30 Minutes Intravenous Once 05/25/23 1921 05/26/23 0201   05/26/23 0800  cefTRIAXone  (ROCEPHIN ) 2 g in sodium chloride  0.9 % 100 mL IVPB        2 g 200 mL/hr over 30 Minutes Intravenous Every 12 hours 05/26/23 0201 06/07/23 2359   05/26/23 0600  ampicillin  (OMNIPEN) 2 g in sodium chloride  0.9 % 100 mL IVPB  Status:  Discontinued        2 g 300 mL/hr over 20 Minutes  Intravenous Every 8 hours 05/25/23 1921 05/27/23 1343   05/26/23 0400  vancomycin  (VANCOREADY) IVPB 750 mg/150 mL  Status:  Discontinued        750 mg 150 mL/hr over 60 Minutes Intravenous Every 12 hours 05/25/23 1947 05/27/23 1827   05/25/23 2000  acyclovir  (ZOVIRAX ) 1,000 mg in dextrose  5 % 250 mL IVPB  Status:  Discontinued        1,000 mg 270 mL/hr over 60 Minutes Intravenous Every 12 hours 05/25/23 1814 05/25/23 1822   05/25/23 2000  acyclovir  (ZOVIRAX ) 700 mg in dextrose  5 % 100 mL IVPB  Status:  Discontinued        700 mg 114 mL/hr over 60 Minutes Intravenous Every 12 hours 05/25/23 1822 05/28/23 1114   05/25/23 1430  cefTRIAXone  (ROCEPHIN ) 2 g in sodium chloride  0.9 % 100 mL IVPB        2 g 200 mL/hr over 30 Minutes Intravenous Once 05/25/23 1419 05/25/23 1940   05/25/23 1430  vancomycin  (VANCOCIN ) IVPB 1000 mg/200 mL premix  Status:  Discontinued        1,000 mg 200 mL/hr over 60 Minutes Intravenous  Once 05/25/23 1419 05/25/23 1426   05/25/23 1430  ampicillin  (OMNIPEN) 2 g in sodium chloride  0.9 % 100 mL IVPB        2 g 300 mL/hr over 20 Minutes Intravenous  Once 05/25/23 1419 05/25/23 1726   05/25/23 1430  vancomycin  (VANCOREADY) IVPB 2000 mg/400 mL        2,000 mg 200 mL/hr over 120 Minutes Intravenous  Once 05/25/23 1426 05/25/23 1826       Medications: Scheduled Meds:  amiodarone   200 mg Oral BID   apixaban   5 mg Per Tube BID   arformoterol   15 mcg Nebulization BID   budesonide  (PULMICORT ) nebulizer solution  0.25 mg Nebulization BID   Chlorhexidine  Gluconate Cloth  6 each Topical Daily   feeding supplement (PROSource TF20)  60 mL Per Tube BID   free water   200 mL Per Tube Q4H   insulin  aspart  0-9 Units Subcutaneous Q4H   mouth rinse  15 mL Mouth Rinse 4 times per day   potassium chloride   20 mEq Per Tube Once   spironolactone   12.5 mg Oral Daily   thiamine  (VITAMIN B1) injection  100 mg Intravenous Daily   Continuous Infusions:  cefTRIAXone  (ROCEPHIN )  IV 200  mL/hr at 06/03/23 0900   [START ON 06/08/2023] cefTRIAXone  (ROCEPHIN )  IV     [START ON 06/08/2023] DAPTOmycin      feeding supplement (VITAL 1.5 CAL) 45 mL/hr at 06/03/23 0900   vancomycin  Stopped (06/03/23 0741)   PRN Meds:.acetaminophen , mouth rinse, polyethylene glycol    Objective: Weight change: 3.6 kg  Intake/Output Summary (Last 24 hours) at 06/03/2023 1253 Last data filed at 06/03/2023 0900 Gross per 24 hour  Intake 2359.02 ml  Output 1408 ml  Net 951.02 ml   Blood pressure (!) 118/55, pulse 70, temperature 98.5 F (36.9 C), temperature source Oral, resp. rate 17, height 5\' 1"  (1.549 m), weight 90.1 kg, SpO2 100%. Temp:  [98 F (36.7 C)-99.5 F (37.5 C)] 98.5 F (36.9 C) (04/18 1149) Pulse Rate:  [61-84] 70 (04/18 0800) Resp:  [14-27] 17 (04/18 0800) BP: (84-122)/(49-84) 118/55 (04/18 0800) SpO2:  [91 %-100 %] 100 % (04/18 0831) Arterial Line BP: (106-142)/(45-66) 125/57 (04/18 0630) Weight:  [90.1 kg] 90.1 kg (04/18 0457)  Physical Exam: Physical Exam HENT:     Head: Normocephalic and atraumatic.     Mouth/Throat:     Pharynx: No oropharyngeal exudate or posterior oropharyngeal erythema.  Cardiovascular:     Rate and Rhythm: Normal rate and regular rhythm.  Pulmonary:     Effort: No respiratory distress.     Breath sounds: No wheezing.  Neurological:     General: No focal deficit present.     Mental Status: She is alert.      CBC:    BMET Recent Labs    06/02/23 0411 06/03/23 0235  NA 149* 148*  K 3.8 3.8  CL 104 110  CO2 34* 32  GLUCOSE 143* 148*  BUN 34* 34*  CREATININE 0.79 0.64  CALCIUM  8.4* 8.6*     Liver Panel  No results for input(s): "PROT", "ALBUMIN", "AST", "ALT", "ALKPHOS", "BILITOT", "BILIDIR", "IBILI" in the last 72 hours.     Sedimentation Rate No results for input(s): "ESRSEDRATE" in the last 72 hours. C-Reactive Protein No results for input(s): "CRP" in the last 72 hours.  Micro Results: Recent Results (from the  past 720  hours)  Resp panel by RT-PCR (RSV, Flu A&B, Covid) Anterior Nasal Swab     Status: None   Collection Time: 05/25/23  5:50 PM   Specimen: Anterior Nasal Swab  Result Value Ref Range Status   SARS Coronavirus 2 by RT PCR NEGATIVE NEGATIVE Final   Influenza A by PCR NEGATIVE NEGATIVE Final   Influenza B by PCR NEGATIVE NEGATIVE Final    Comment: (NOTE) The Xpert Xpress SARS-CoV-2/FLU/RSV plus assay is intended as an aid in the diagnosis of influenza from Nasopharyngeal swab specimens and should not be used as a sole basis for treatment. Nasal washings and aspirates are unacceptable for Xpert Xpress SARS-CoV-2/FLU/RSV testing.  Fact Sheet for Patients: BloggerCourse.com  Fact Sheet for Healthcare Providers: SeriousBroker.it  This test is not yet approved or cleared by the [place]  FDA and has been authorized for detection and/or diagnosis of SARS-CoV-2 by FDA under an Emergency Use Authorization (EUA). This EUA will remain in effect (meaning this test can be used) for the duration of the COVID-19 declaration under Section 564(b)(1) of the Act, 21 U.S.C. section 360bbb-3(b)(1), unless the authorization is terminated or revoked.     Resp Syncytial Virus by PCR NEGATIVE NEGATIVE Final    Comment: (NOTE) Fact Sheet for Patients: BloggerCourse.com  Fact Sheet for Healthcare Providers: SeriousBroker.it  This test is not yet approved or cleared by the [place]  FDA and has been authorized for detection and/or diagnosis of SARS-CoV-2 by FDA under an Emergency Use Authorization (EUA). This EUA will remain in effect (meaning this test can be used) for the duration of the COVID-19 declaration under Section 564(b)(1) of the Act, 21 U.S.C. section 360bbb-3(b)(1), unless the authorization is terminated or revoked.  Performed at [facility], [address]  [place], [place] [address]   Respiratory (~20 pathogens) panel by PCR     Status: Abnormal   Collection Time: 05/25/23  5:50 PM   Specimen: Anterior Nasal Swab; Respiratory  Result Value Ref Range Status   Adenovirus NOT DETECTED NOT DETECTED Final   Coronavirus 229E NOT DETECTED NOT DETECTED Final    Comment: (NOTE) The Coronavirus on the Respiratory Panel, DOES NOT test for the novel  Coronavirus (2019 nCoV)    Coronavirus HKU1 NOT DETECTED NOT DETECTED Final   Coronavirus NL63 NOT DETECTED NOT DETECTED Final   Coronavirus OC43 NOT DETECTED NOT DETECTED Final   Metapneumovirus NOT DETECTED NOT DETECTED Final   Rhinovirus / Enterovirus DETECTED (A) NOT DETECTED Final   Influenza A NOT DETECTED NOT DETECTED Final   Influenza B NOT DETECTED NOT DETECTED Final   Parainfluenza Virus 1 NOT DETECTED NOT DETECTED Final   Parainfluenza Virus 2 NOT DETECTED NOT DETECTED Final   Parainfluenza Virus 3 NOT DETECTED NOT DETECTED Final   Parainfluenza Virus 4 NOT DETECTED NOT DETECTED Final   Respiratory Syncytial Virus NOT DETECTED NOT DETECTED Final   Bordetella pertussis NOT DETECTED NOT DETECTED Final   Bordetella Parapertussis NOT DETECTED NOT DETECTED Final   Chlamydophila pneumoniae NOT DETECTED NOT DETECTED Final   Mycoplasma pneumoniae NOT DETECTED NOT DETECTED Final    Comment: Performed at [facility], [address] [place], [place] [address]  MRSA Next Gen by PCR, Nasal     Status: None   Collection Time: 05/26/23  3:20 AM   Specimen: Nasal Mucosa; Nasal Swab  Result Value Ref Range Status   MRSA by PCR Next Gen NOT DETECTED NOT DETECTED Final    Comment: (NOTE) The  GeneXpert MRSA Assay (FDA approved for NASAL specimens only), is one component of a comprehensive MRSA colonization surveillance program. It is not intended to diagnose MRSA infection nor to guide or monitor treatment for MRSA infections. Test performance is not FDA approved in patients less than 67  years old. Performed at [facility], [address] [place], [place] [address]   Culture, blood (Routine X 2) w Reflex to ID Panel     Status: None   Collection Time: 05/26/23  5:46 AM   Specimen: BLOOD LEFT HAND  Result Value Ref Range Status   Specimen Description BLOOD LEFT HAND  Final   Special Requests   Final    BOTTLES DRAWN AEROBIC AND ANAEROBIC Blood Culture adequate volume   Culture   Final    NO GROWTH 5 DAYS Performed at [facility], [address], [place], [place] [address]    Report Status 05/31/2023 FINAL  Final  Culture, blood (Routine X 2) w Reflex to ID Panel     Status: None   Collection Time: 05/26/23 10:06 AM   Specimen: BLOOD RIGHT HAND  Result Value Ref Range Status   Specimen Description BLOOD RIGHT HAND  Final   Special Requests   Final    AEROBIC BOTTLE ONLY Blood Culture results may not be optimal due to an inadequate volume of blood received in culture bottles   Culture   Final    NO GROWTH 5 DAYS Performed at [facility], [address], [place], [place] [address]    Report Status 05/31/2023 FINAL  Final    Studies/Results: US  EKG SITE RITE Result Date: 06/03/2023 If Site Rite image not attached, placement could not be confirmed due to current cardiac rhythm.     Assessment/Plan:  INTERVAL HISTORY: MRI of the brain showed leptomeningeal enhancement consistent with bacterial meningitis   Principal Problem:   Lumbar discitis Active Problems:   Altered mental status   Acute encephalopathy   Rhinovirus   Septic shock (HCC)   Osteomyelitis of lumbar spine (HCC)   On mechanically assisted ventilation (HCC)   Acute respiratory failure with hypoxia (HCC)   Diskitis   Bacterial meningitis    Morgan Morgan is a 73 y.o. female with bacterial meningitis and lumbar diskitis and septic shock now recovering   #1  Meningitis with discitis vertebral osteomyelitis:  Plan on the 6-week course of therapy with 2 weeks of  Trexan 2 g IV every 12 along with vancomycin  dosed to ensure CNS penetration then followed by 4 weeks of ceftriaxone  2 g daily and daptomycin  500 mg IV daily  Diagnosis: Meningitis/discitis   Culture Result: None  Allergies  Allergen Reactions   Zithromax [Azithromycin] Itching    OPAT Orders Discharge antibiotics to be given via PICC line Discharge antibiotics:   Vancomycin  750 mg IV every 12 hours and Rocephin  2g IV every 12 hours thru 4/22 followed by Daptomycin  500 mg IV every 24 hours and Rocephin  2g IV every 24 hours for 4 additional weeks thru 07/05/23   Duration: 6 weeks  End Date: End date: 06/07/23 (CNS/meningitis dosing) and 07/05/23 (discitis dosing) End date: 06/07/23 (CNS/meningitis dosing) and 07/05/23 (discitis dosing)   PIC Care Per Protocol:  Home health RN for IV administration and teaching; PICC line care and labs.     While on vancomycin  need BIWEEKLY x__ BMP  _x_ Vancomycin  trough   And weekly  _x_ CBC with differential _x_ BMP  _x_ CRP  On  daptomycin  and ceftriaxone  then the labs will be as follows  Labs weekly while on IV antibiotics: x__ CBC with differential _x_ BMP __ CMP _x_ CRP _x_ ESR  x__ CK  _x_ Please pull PIC at completion of IV antibiotics __ Please leave PIC in place until doctor has seen patient or been notified  Fax weekly labs to [phone]  Clinic Follow Up Appt:   Manpreet Gwynne has an appointment on  07/04/2023 at 215PM with Dr. Ernie Heal at  [facility] for Infectious Disease, which  is located in the [facility] at  [address] in [place].  [address], which is located to the left of the elevators.  Phone: [phone]  Fax: [phone]  [email or web address]  The patient should arrive 30 minutes prior to their appoitment.   CRITICAL CARE Performed by: Deberah [place]   Total critical care time: 30  minutes  Critical care time was exclusive  of separately billable procedures and treating other patients.  Critical care was necessary to treat or prevent imminent or life-threatening deterioration.  Critical care was time spent personally by me on the following activities: development of treatment plan with patient and/or surrogate as well as nursing, discussions with consultants, evaluation of patient's response to treatment, examination of patient, obtaining history from patient or surrogate, ordering and performing treatments and interventions, ordering and review of laboratory studies, ordering and review of radiographic studies, pulse oximetry and re-evaluation of patient's condition.  Evaluation of the patient requires complex antimicrobial therapy evaluation, counseling , isolation needs to reduce disease transmission and risk assessment and mitigation.   I will sign off for now.  Please call with further questions.   LOS: 9 days   Sheela Denmark 06/03/2023, 12:53 PM

## 2023-06-03 NOTE — Evaluation (Addendum)
 Physical Therapy Evaluation Patient Details Name: Morgan Morgan MRN: [id number] DOB: 06-03-1950 Today's Date: 06/03/2023  History of Present Illness  73 y.o. female presents to [facility] 05/25/23 after falling at home with AMS. Code stroke activated with no remarkable findings. 4/10 transfer to ICU. 4/10-4/17 intubated. Pt with meningoencephalitis, positive rhinovirus/enterovirus, L2-L3 osteomyelitis/discitis. PMHx: asthma, HTN   Clinical Impression  Pt in bed upon arrival and agreeable to PT eval. PTA, pt was independent for mobility with no AD. In today's session, pt was restless and impulsive with mobility. Pt had decreased safety awareness and kept attempting to leave the bed in order to walk to the bathroom. Pt required MaxAx2/TotalAx2 for bed mobility. Attempted two stands with MaxAx2 and use of bed pad/gait belt with pt unable to reach an upright posture or maintain stand for >3 seconds. Pt currently with functional limitations due to the deficits listed below (see PT Problem List). Pt would benefit from acute skilled PT to address functional impairments. Recommending post-acute rehab <3hrs to work towards independence with mobility. Acute PT to follow.    BP 111/61, 70 BPM SpO2 92% on 3L when pleth reading was accurate      If plan is discharge home, recommend the following: A lot of help with walking and/or transfers;A lot of help with bathing/dressing/bathroom;Assistance with cooking/housework;Assist for transportation;Help with stairs or ramp for entrance   Can travel by private vehicle   No    Equipment Recommendations Rolling walker (2 wheels);BSC/3in1;Wheelchair (measurements PT);Wheelchair cushion (measurements PT);Hospital bed;Hoyer lift     Functional Status Assessment Patient has had a recent decline in their functional status and demonstrates the ability to make significant improvements in function in a reasonable and predictable amount of time.     Precautions / Restrictions  Precautions Precautions: Fall Recall of Precautions/Restrictions: Impaired Precaution/Restrictions Comments: mitts, NG tube, L IJ CVC Restrictions Weight Bearing Restrictions Per Provider Order: No      Mobility  Bed Mobility Overal bed mobility: Needs Assistance Bed Mobility: Rolling, Sidelying to Sit, Sit to Supine Rolling: Max assist, +2 for physical assistance, +2 for safety/equipment Sidelying to sit: Max assist, +2 for physical assistance, +2 for safety/equipment, HOB elevated   Sit to supine: Total assist, +2 for physical assistance, +2 for safety/equipment   General bed mobility comments: pt able to slightly move LE's towards EOB. MaxAx2 to complete movement and raise trunk. Use of bed pad to shift hips forwad with ModAx2    Transfers Overall transfer level: Needs assistance Equipment used: 2 person hand held assist Transfers: Sit to/from Stand Sit to Stand: Max assist, +2 physical assistance, +2 safety/equipment    General transfer comment: x2 attempts at standing with pt unable to reach upright posture. Able to stand for <3 secs before lowering to the bed. MaxAx2 with use of gait belt and bed pad    Ambulation/Gait  General Gait Details: unable this date     Balance Overall balance assessment: Needs assistance, Mild deficits observed, not formally tested, History of Falls Sitting-balance support: Feet supported, Bilateral upper extremity supported Sitting balance-Leahy Scale: Fair     Standing balance support: Bilateral upper extremity supported, During functional activity, Reliant on assistive device for balance Standing balance-Leahy Scale: Zero Standing balance comment: unable to maintain balance without MaxAx2       Pertinent Vitals/Pain Pain Assessment Pain Assessment: Faces Faces Pain Scale: Hurts little more Pain Location: bottom Pain Descriptors / Indicators: Discomfort, Grimacing Pain Intervention(s): Limited activity within patient's tolerance,  Monitored during session, Repositioned  Home Living Family/patient expects to be discharged to:: Private residence Living Arrangements: Other relatives (7 yo grandson; cousin) Available Help at Discharge: Family;Available PRN/intermittently Type of Home: House Home Access: Elevator     Home Layout: One level Home Equipment: Shower seat - built in;Grab bars - tub/shower;Grab bars - toilet      Prior Function Prior Level of Function : Independent/Modified Independent;Driving;Working/employed  Mobility Comments: independent with no AD ADLs Comments: independent, very active     Extremity/Trunk Assessment   Upper Extremity Assessment Upper Extremity Assessment: Defer to OT evaluation    Lower Extremity Assessment Lower Extremity Assessment: RLE deficits/detail;LLE deficits/detail RLE Deficits / Details: Hip flexion 3/5, Knee ext 4+/5, ankle DF- 4+/5 LLE Deficits / Details: Hip flexion 3/5, Knee ext 4+/5, ankle DF- 4+/5    Cervical / Trunk Assessment Cervical / Trunk Assessment: Other exceptions Cervical / Trunk Exceptions: increased body habitus  Communication   Communication Communication: Impaired Factors Affecting Communication: Reduced clarity of speech    Cognition Arousal: Alert Behavior During Therapy: Impulsive, Restless   PT - Cognitive impairments: No family/caregiver present to determine baseline, Orientation, Memory, Awareness, Sequencing, Problem solving, Safety/Judgement   Orientation impairments: Time, Situation    PT - Cognition Comments: Pt confused throughout session. Restless and impulsive with trying to stand, attempting to leave the bed, and trying to pull NG tube Following commands: Impaired Following commands impaired: Follows one step commands inconsistently, Follows one step commands with increased time     Cueing Cueing Techniques: Verbal cues, Tactile cues      PT Assessment Patient needs continued PT services  PT Problem List Decreased  strength;Decreased activity tolerance;Decreased mobility;Decreased balance;Decreased knowledge of use of DME;Decreased safety awareness;Obesity       PT Treatment Interventions DME instruction;Gait training;Stair training;Therapeutic activities;Functional mobility training;Therapeutic exercise;Balance training;Neuromuscular re-education;Patient/family education    PT Goals (Current goals can be found in the Care Plan section)  Acute Rehab PT Goals Patient Stated Goal: to walk to the bathroom PT Goal Formulation: With patient Time For Goal Achievement: 06/17/23 Potential to Achieve Goals: Good    Frequency Min 2X/week     Co-evaluation PT/OT/SLP Co-Evaluation/Treatment: Yes Reason for Co-Treatment: Necessary to address cognition/behavior during functional activity;For patient/therapist safety;To address functional/ADL transfers PT goals addressed during session: Mobility/safety with mobility;Balance         AM-PAC PT "6 Clicks" Mobility  Outcome Measure Help needed turning from your back to your side while in a flat bed without using bedrails?: Total Help needed moving from lying on your back to sitting on the side of a flat bed without using bedrails?: Total Help needed moving to and from a bed to a chair (including a wheelchair)?: Total Help needed standing up from a chair using your arms (e.g., wheelchair or bedside chair)?: Total Help needed to walk in hospital room?: Total Help needed climbing 3-5 steps with a railing? : Total 6 Click Score: 6    End of Session Equipment Utilized During Treatment: Gait belt;Oxygen Activity Tolerance: Patient tolerated treatment well Patient left: in bed;with call bell/phone within reach;with bed alarm set;with nursing/sitter in room Nurse Communication: Mobility status PT Visit Diagnosis: Unsteadiness on feet (R26.81);Other abnormalities of gait and mobility (R26.89);Muscle weakness (generalized) (M62.81);History of falling (Z91.81)     Time: 1430-1501 PT Time Calculation (min) (ACUTE ONLY): 31 min   Charges:   PT Evaluation $PT Eval Moderate Complexity: 1 Mod   PT General Charges $$ ACUTE PT VISIT: 1 Visit       Aleda Hurl  Lynwood Sauers, DPT Secure Chat Preferred  Rehab Office [phone]   Alissa April Adela Ades 06/03/2023, 5:11 PM

## 2023-06-03 NOTE — Plan of Care (Signed)
  Problem: Clinical Measurements: Goal: Ability to maintain clinical measurements within normal limits will improve Outcome: Progressing Goal: Diagnostic test results will improve Outcome: Progressing Goal: Respiratory complications will improve Outcome: Progressing   Problem: Activity: Goal: Risk for activity intolerance will decrease Outcome: Progressing   Problem: Nutrition: Goal: Adequate nutrition will be maintained Outcome: Progressing   Problem: Elimination: Goal: Will not experience complications related to bowel motility Outcome: Progressing   Problem: Pain Managment: Goal: General experience of comfort will improve and/or be controlled Outcome: Progressing   Problem: Respiratory: Goal: Ability to maintain a clear airway and adequate ventilation will improve Outcome: Progressing   Problem: Role Relationship: Goal: Method of communication will improve Outcome: Progressing   Problem: Metabolic: Goal: Ability to maintain appropriate glucose levels will improve Outcome: Progressing   Problem: Tissue Perfusion: Goal: Adequacy of tissue perfusion will improve Outcome: Progressing   Problem: Coping: Goal: Level of anxiety will decrease Outcome: Not Progressing

## 2023-06-03 NOTE — Progress Notes (Signed)
 PHARMACY - ANTICOAGULATION CONSULT NOTE  Pharmacy Consult for heparin  >> Eliquis  Indication: atrial fibrillation  Allergies  Allergen Reactions   Zithromax [Azithromycin] Itching    Patient Measurements: Height: 5\' 1"  (154.9 cm) Weight: 90.1 kg (198 lb 10.2 oz) IBW/kg (Calculated) : 47.8 HEPARIN  DW (KG): 68.3  Vital Signs: Temp: 98 F (36.7 C) (04/18 0330) Temp Source: Oral (04/18 0330) BP: 122/60 (04/18 0600) Pulse Rate: 61 (04/18 0530)  Labs: Recent Labs    06/01/23 0401 06/01/23 1125 06/02/23 0411 06/03/23 0235  HGB 10.1*  --   --  10.1*  HCT 30.4*  --   --  32.4*  PLT 286  --   --  401*  HEPARINUNFRC 0.84* 0.63 0.68 0.75*  CREATININE 0.79  --  0.79 0.64    Estimated Creatinine Clearance: 64 mL/min (by C-G formula based on SCr of 0.64 mg/dL).   Medical History: Past Medical History:  Diagnosis Date   Asthma    Hypertension     Medications:  Scheduled:   apixaban   5 mg Oral BID   arformoterol   15 mcg Nebulization BID   budesonide  (PULMICORT ) nebulizer solution  0.25 mg Nebulization BID   Chlorhexidine  Gluconate Cloth  6 each Topical Daily   feeding supplement (PROSource TF20)  60 mL Per Tube BID   free water   200 mL Per Tube Q4H   insulin  aspart  0-9 Units Subcutaneous Q4H   mouth rinse  15 mL Mouth Rinse 4 times per day   polyethylene glycol  17 g Per Tube Daily   thiamine  (VITAMIN B1) injection  100 mg Intravenous Daily    Assessment: 72 yom presenting with AMS. No AC PTA. Found to be in Afib this admission, heparin  per pharmacy ordered.  Heparin  level this morning came back therapeutic at 0.68, on 1600 units/hr. No s/sx of bleeding or infusion issues. RN noted what looked like old oozing at IV site.  4/18 AM: Patient is now extubated and has a cortrak so will transition from heparin  infusion to Eliquis  today.   Goal of Therapy:  Monitor platelets by anticoagulation protocol: Yes   Plan:  Stop heparin  infusion at 0830 Start Eliquis  5 mg PO  BID at 0830 Continue to monitor CBC and for s/sx of bleeding.  Pharmacy will sign off at this time but will continue to monitor peripherally.   Juleen Oakland, PharmD PGY1 Pharmacy Resident 06/03/2023 7:40 AM

## 2023-06-03 NOTE — Progress Notes (Signed)
 PT Cancellation Note  Patient Details Name: Morgan Morgan MRN: [id number] DOB: 04/23/1950   Cancelled Treatment:    Reason Eval/Treat Not Completed: MD/medical team in room for procedure. Acute PT to re-attempt as schedule allows.  Kate ORN, PT, DPT Secure Chat Preferred  Rehab Office [phone]   Kate BRAVO Wendolyn 06/03/2023, 11:45 AM

## 2023-06-03 NOTE — Progress Notes (Addendum)
 Nutrition Follow-up  DOCUMENTATION CODES:   Not applicable  INTERVENTION:   Continue TF via Cortrak: Vital 1.5 at 45 ml/h (1080 ml per day). Prosource TF20 60 ml BID. Provides 1780 kcal, 113 gm protein, 825 ml free water  daily. Free water  flushes 200 ml q4h for a total of 2025 ml free water  daily (TF+flush).  NUTRITION DIAGNOSIS:   Inadequate oral intake related to inability to eat as evidenced by NPO status.  Ongoing   GOAL:   Patient will meet greater than or equal to 90% of their needs  Met with TF  MONITOR:   Vent status, TF tolerance  REASON FOR ASSESSMENT:   Consult Enteral/tube feeding initiation and management  ASSESSMENT:   73 yo female admitted with AMS, acute encephalopathy after being found down at home. Patient decompensated 4/10 early AM and required transfer to the ICU and intubated later in the day. PMH includes asthma, HTN.  Advanced HF team following for new systolic heart failure.  Work-up for acute encephalopathy is ongoing. Probably meningitis, which she has been treated for.  Also concern for discitis/osteomyelitis per MRI spine 4/11.  Extubated 4/17. Patient is tolerating TF well via Cortrak: Vital 1.5 at 45 ml/h, Prosource TF20 60 ml BID, free water  flushes 200 ml q4h.  Currently NPO per SLP recommendations.   Labs reviewed. Na 148 CBG: 108-86  Medications reviewed and include novolog , spironolactone , thiamine .  Admit weight 99.4 kg (4/9) Current weight 90.1 kg  I/O +4.5 L since admission UOP 1868 ml x 24 h   Diet Order:   Diet Order     None       EDUCATION NEEDS:   No education needs have been identified at this time  Skin:  Skin Assessment: Reviewed RN Assessment (L pretibial blister)  Last BM:  4/18 type 7  Height:   Ht Readings from Last 1 Encounters:  05/26/23 5\' 1"  (1.549 m)    Weight:   Wt Readings from Last 1 Encounters:  06/03/23 90.1 kg    BMI:  Body mass index is 37.53 kg/m.  Estimated  Nutritional Needs:   Kcal:  1600-1800  Protein:  95-115 gm  Fluid:  1.6-1.8 L   Barnet Boots RD, LDN, CNSC Contact via secure chat. If unavailable, use group chat "RD Inpatient."

## 2023-06-03 NOTE — Consult Note (Addendum)
 Advanced Heart Failure Team Consult Note   Primary Physician: Ezell Hollow, NP Cardiologist:  None  Reason for Consultation: Acute Systolic Heart Failure HPI:    Morgan Morgan is seen today for evaluation of actue systolic heart failure at the request of Dr. Felipe Horton.   Morgan Morgan is a 73 y.o. female with history of asthma and HTN.   She presented to [facility] on 4/9 with AMS after being found down by her grandson. CT head, CT angio, and MRI brain negative. UDS negative. Initial labs notable for K 3.3, WBC 15, hgb 15,  LA 3.3, CK 672, BUN.Cr 11/1.02, RVP + for rhinovirus. EKG showed AF RVR 116 bpm and started on amio. CXR with cardiomegaly. Lactate continued to climb up to 5.1. She developed a fever and was started on ceftriaxone , vancomycin , ampicillin  with acyclovir . Continued to decline requiring intubation and pressor initiation for hypotension. Admitted to ICU.   Infectious w/u negative thus far. LP performed (multiple unsuccessful attempts) for concern for discitis +/- meningitis, sample unable to be run d/t clot. Treating empirically. Echo performed showing newly reduced EF 20-25%, gHK, rv NL, sev LAE, no valvular disease. No cardiac work up pursued d/t AMS. MRI spine 05/26/13 with findings consistent with discitis/osteomyelitis. EEG showed severe diffuse encephalopathy. Goals of care discussion has been started with the family by CCM and Palliative team, due to concerns that encephalopathy my not be reversible, as condition has remained unchanged with treatment. However yesterday, patient was more awake and responsive and successfully extubated.   Advanced Heart Failure Consulted for further work up of her heart failure, now that mental status is showing improvement. POCUS echo by Dr. Julane Ny today showed EF up to 40-45%.  Somnolent on exam.   Home Medications Prior to Admission medications   Medication Sig Start Date End Date Taking? Authorizing Provider  mometasone -formoterol   (DULERA ) 100-5 MCG/ACT AERO Inhale 2 puffs into the lungs 2 (two) times daily. 07/27/12  Yes Gosrani, Nimish C, MD  albuterol  (VENTOLIN  HFA) 108 (90 Base) MCG/ACT inhaler Inhale into the lungs every 6 (six) hours as needed for wheezing or shortness of breath.    [provider]  amLODipine (NORVASC) 5 MG tablet Take 5 mg by mouth daily.    [provider]  D3 SUPER STRENGTH 50 MCG (2000 UT) CAPS Take 1 capsule by mouth daily. 08/03/21   [provider]    Past Medical History: Past Medical History:  Diagnosis Date   Asthma    Hypertension     Past Surgical History: Past Surgical History:  Procedure Laterality Date   ABDOMINAL HYSTERECTOMY     IR LUMBAR PUNCTURE  05/28/2023    Family History: Family History  Problem Relation Age of Onset   Asthma Mother    Asthma Brother    Asthma Son    Colon cancer Neg Hx    Colon polyps Neg Hx     Social History: Social History   Socioeconomic History   Marital status: Widowed    Spouse name: Not on file   Number of children: Not on file   Years of education: Not on file   Highest education level: Not on file  Occupational History   Not on file  Tobacco Use   Smoking status: Never   Smokeless tobacco: Not on file  Vaping Use   Vaping status: Never Used  Substance and Sexual Activity   Alcohol use: No   Drug use: No   Sexual activity: Yes  Other Topics Concern   Not on file  Social History Narrative   Not on file   Social Drivers of Health   Financial Resource Strain: Not on file  Food Insecurity: Patient Unable To Answer (05/26/2023)   Hunger Vital Sign    Worried About Running Out of Food in the Last Year: Patient unable to answer    Ran Out of Food in the Last Year: Patient unable to answer  Transportation Needs: Patient Unable To Answer (05/26/2023)   PRAPARE - Transportation    Lack of Transportation (Medical): Patient unable to answer    Lack of Transportation (Non-Medical): Patient unable  to answer  Physical Activity: Not on file  Stress: Not on file  Social Connections: Patient Unable To Answer (05/26/2023)   Social Connection and Isolation Panel [NHANES]    Frequency of Communication with Friends and Family: Patient unable to answer    Frequency of Social Gatherings with Friends and Family: Patient unable to answer    Attends Religious Services: Patient unable to answer    Active Member of Clubs or Organizations: Patient unable to answer    Attends Banker Meetings: Patient unable to answer    Marital Status: Patient unable to answer    Allergies:  Allergies  Allergen Reactions   Zithromax [Azithromycin] Itching    Objective:    Vital Signs:   Temp:  [98 F (36.7 C)-99.5 F (37.5 C)] 98 F (36.7 C) (04/18 0330) Pulse Rate:  [61-86] 70 (04/18 0800) Resp:  [13-27] 17 (04/18 0800) BP: (84-122)/(49-84) 118/55 (04/18 0800) SpO2:  [91 %-100 %] 100 % (04/18 0831) Arterial Line BP: (106-142)/(45-66) 125/57 (04/18 0630) Weight:  [90.1 kg] 90.1 kg (04/18 0457) Last BM Date : 06/02/23  Weight change: Filed Weights   06/01/23 0421 06/02/23 0446 06/03/23 0457  Weight: 88.9 kg 86.5 kg 90.1 kg   Intake/Output:  Intake/Output Summary (Last 24 hours) at 06/03/2023 1038 Last data filed at 06/03/2023 0900 Gross per 24 hour  Intake 2479.88 ml  Output 1648 ml  Net 831.88 ml    Physical Exam    General: Somnolent appearing. No distress on RA Cardiac: JVP to midneck. S1 and S2 present. No murmurs or rub. Extremities: Warm and dry.  Trace BLE edema.  Neuro: Somnolent, withdrawals from repeated stimulation  Telemetry   SR 60-70s (personally reviewed)  EKG    No new ECG to review. Initial ECG, questionable whether actual AF versus ST with frequent PVCs  Labs   Basic Metabolic Panel: Recent Labs  Lab 05/30/23 0500 05/31/23 0305 06/01/23 0401 06/02/23 0411 06/03/23 0235  NA 142 144 145 149* 148*  K 3.7 4.3 3.9 3.8 3.8  CL 105 105 104 104 110   CO2 30 30 33* 34* 32  GLUCOSE 110* 137* 154* 143* 148*  BUN 23 26* 31* 34* 34*  CREATININE 0.74 0.85 0.79 0.79 0.64  CALCIUM  7.6* 8.2* 8.3* 8.4* 8.6*  MG 2.2 2.2 2.3 2.5* 2.6*  PHOS 3.7 3.4 3.4 3.5 3.3   CBC: Recent Labs  Lab 05/29/23 0240 05/30/23 0500 05/31/23 0305 06/01/23 0401 06/03/23 0235  WBC 12.3* 11.3* 14.4* 13.7* 14.2*  HGB 10.2* 9.5* 10.0* 10.1* 10.1*  HCT 31.0* 28.8* 30.5* 30.4* 32.4*  MCV 86.1 87.0 86.9 87.4 88.8  PLT 137* 164 240 286 401*   BNP: BNP (last 3 results) Recent Labs    05/27/23 0715  BNP 420.9*   CBG: Recent Labs  Lab 06/02/23 1608 06/02/23 1924 06/02/23 2310 06/03/23  0330 06/03/23 0730  GLUCAP 140* 98 112* 121* 108*   Imaging   US  EKG SITE RITE Result Date: 06/03/2023 If Site Rite image not attached, placement could not be confirmed due to current cardiac rhythm.  Medications:    Current Medications:  apixaban   5 mg Oral BID   arformoterol   15 mcg Nebulization BID   budesonide  (PULMICORT ) nebulizer solution  0.25 mg Nebulization BID   Chlorhexidine  Gluconate Cloth  6 each Topical Daily   feeding supplement (PROSource TF20)  60 mL Per Tube BID   free water   200 mL Per Tube Q4H   insulin  aspart  0-9 Units Subcutaneous Q4H   mouth rinse  15 mL Mouth Rinse 4 times per day   thiamine  (VITAMIN B1) injection  100 mg Intravenous Daily   Infusions:  cefTRIAXone  (ROCEPHIN )  IV 200 mL/hr at 06/03/23 0900   [START ON 06/08/2023] cefTRIAXone  (ROCEPHIN )  IV     [START ON 06/08/2023] DAPTOmycin      feeding supplement (VITAL 1.5 CAL) 45 mL/hr at 06/03/23 0900   vancomycin  Stopped (06/03/23 0741)   Patient Profile   Morgan Morgan is a 73 y.o. female with history of asthma and HTN. AHF consulted for newly reduced EF.   Assessment/Plan   Acute Systolic Heart Failure - in the setting of infectious process and AF - Echo with EF 20-25%, gHK, rv NL, sev LAE, no valvular disease - POCUS today with EF 40-45% by Dr. Julane Ny read - PICC line  attempted, however patient unable to sit still for placement - GDMT limited by BP - start spiro 12.5 mg daily - no SGLT2i with encephalopathy and infections process  2. ?Afib vs. Frequent PACs - unsure if initial ECG truly AF versus ST with frequent PACs - remains in NSR with PACs - start amio 200 mg bid - continue eliquis  5 mg bid  3. Acute encephalopathy 4. Discitis 5. ?Meningitis - CT head and brain MRI negative - EEG consistent with encephalopathy - Multiple unsuccessful LPs - MRI spine showed discitis - neuro initially following but has now signed off - Abx treatment per CCM - lethargic, did not open eyes but withdrawals from repeated stim  Length of Stay: 9  [place] Lee, NP  06/03/2023, 10:38 AM  Advanced Heart Failure Team Pager [phone] (M-F; 7a - 5p)  Please contact [facility] for night-coverage after hours (4p -7a ) and weekends on [email or web address]  Patient seen and examined with the above-signed Advanced Practice Provider and/or Housestaff. I personally reviewed laboratory data, imaging studies and relevant notes. I independently examined the patient and formulated the important aspects of the plan. I have edited the note to reflect any of my changes or salient points. I have personally discussed the plan with the patient and/or family.  73 y/o woman with no known cardiac history admitted with acute encephalopathy, W/u has revealed probable meningitis.   On arrival felt to be in AF (I suspect sinus with frequent PACs). Echo EF 20-25% with global HK.  Has been treated for bacterial/viral meningitis and diskitis. Now extubated. Lethargic. Unable to provide me any history  Has been on amio in-house but stopped.   I did POCUS ECHO EF 40-45%  General: Lethargic. Withdraws to pain Does not communicate HEENT: normal Neck: supple. no JVD. Cor:  Regular rate & rhythm. No rubs, gallops or murmurs. Lungs: clear Abdomen: obese  soft, nontender, nondistended. No  hepatosplenomegaly. No bruits or masses. Good bowel sounds. 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   EF now improved to 40-45%   Will resume amio to help suppress atrial ectopy. Doubt AF so can stop Eliquis  Start GDMT as tolerated.   Will repeat formal echo on Monday. If EF still down consider cMRI.   She is transferring out of ICU. [facility] to follow.  Jules Oar, MD  2:56 PM

## 2023-06-03 NOTE — Progress Notes (Signed)
   NAME:  Morgan Morgan, MRN:  [id number], DOB:  05/26/1950, LOS: 9 ADMISSION DATE:  05/25/2023, CONSULTATION DATE:  06/03/2023  REFERRING MD:  Sonia Durand, CHIEF COMPLAINT: Altered mental status  History of Present Illness:  Patient is unresponsive, history obtained via chart review. 73 year old woman BIBEMS as a code stroke after being found down by her grandson.  Initial neuroexam she was obtunded with disconjugate gaze, grimaces to pain, withdrawing both lower extremities and localizing in uppers.  Head CT and CT angio was negative as well as UDS Initial labs significant for mild leukocytosis and thrombocytopenia with lactate of 3.3 and CK of 672 with hypokalemia.  Respiratory viral panel was positive for rhinovirus. She subsequently developed fever She was empirically treated with ceftriaxone , vancomycin  and ampicillin  with acyclovir  Repeat labs showed lactate rising to 4.2, PCCM consulted due to concern for ability to maintain airway EKG showed atrial fibrillation MRI brain was negative  Pertinent  Medical History  Asthma Hypertension  Significant Hospital Events: Including procedures, antibiotic start and stop dates in addition to other pertinent events   4/9 admitted with AMS, worsened overnight requiring intubation and ICU txfr 4/11 remains intubated with mental status precluding extubation, plan for LP with IR today 4/17 extubated  Interim History / Subjective:  Looks great Following commands x 4  Objective   Blood pressure 122/60, pulse 61, temperature 98 F (36.7 C), temperature source Oral, resp. rate 20, height 5\' 1"  (1.549 m), weight 90.1 kg, SpO2 100%. 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 LP injury? Vs. Hematologic spread Severely elevated Pct- improved Afib, Type II MRI, HFrEF- thought to be NOMI process (reactive) Rhinovirus Run Vt vs. Afib with block overnight- asymptomatic  - Increase FWF - Heparin  gtt to [facility] - TF via cortrak - Given great improvement in mental status will have cardiology evaluate her newly reduced EF for ?ischemic eval and GDMT - PT/OT/SLP consults appreciated - Remove foley, a line, central line - place PICC for prolonged abx as outlined by ID - stable for transfer to progressive, appreciate TRH taking over care  Ardelle Kos MD PCCM

## 2023-06-03 NOTE — Plan of Care (Signed)
 Brief Palliative Medicine Progress Note:  PMT following peripherally for needs/decline:  Medical records reviewed including progress notes, labs, imaging. Extubated 4/17. EF improved to 40-45%. Patient transferring out of ICU today. SLP still recommending NPO.  Goals are clear for full code/full scope care. PMT will follow peripherally and visit with patient and family incrementally for goals of care discussions as appropriate and based on clinical course. If there are any imminent needs please call the service directly. Family also has PMT contact information should further needs arise.  Thank you for allowing PMT to assist in the care of this patient.  Maizee Reinhold M. Felipe Horton [facility] Palliative Medicine Team Team Phone: [phone] NO CHARGE

## 2023-06-03 NOTE — Progress Notes (Signed)
 Foley catheter removed per order, patient due to void by 0100 on 06/04/23.

## 2023-06-03 NOTE — Progress Notes (Signed)
 PHARMACY CONSULT NOTE FOR:  OUTPATIENT  PARENTERAL ANTIBIOTIC THERAPY (OPAT)  Indication: Meningitis/discitis Regimen: Vancomycin  750 mg IV every 12 hours and Rocephin  2g IV every 12 hours thru 4/22 followed by Daptomycin  500 mg IV every 24 hours and Rocephin  2g IV every 24 hours for 4 additional weeks thru 07/05/23 End date: 06/07/23 (CNS/meningitis dosing) and 07/05/23 (discitis dosing)  IV antibiotic discharge orders are pended. To discharging provider:  please sign these orders via discharge navigator,  Select New Orders & click on the button choice - Manage This Unsigned Work.     Thank you for allowing pharmacy to be a part of this patient's care.  Garland Junk, PharmD, BCPS, BCIDP Infectious Diseases Clinical Pharmacist 06/03/2023 7:58 AM   **Pharmacist phone directory can now be found on [email or web address] ([email or web address]).  Listed under [facility].

## 2023-06-03 NOTE — Progress Notes (Signed)
 MD decided to keep the CVC and PICC order was discontinued at this time. HS [employer]

## 2023-06-03 NOTE — Progress Notes (Addendum)
 eLink Physician-Brief Progress Note Patient Name: Morgan Morgan DOB: 30-Apr-1950 MRN: [id number]   Date of Service  06/03/2023  HPI/Events of Note  73 y.o. female presents to [facility] 05/25/23 after falling at home with AMS. Code stroke activated with no remarkable findings. 4/10 transfer to ICU. 4/10-4/17 intubated. Pt with meningoencephalitis, positive rhinovirus/enterovirus, L2-L3 osteomyelitis/discitis.   Transfer to the floor but extremely agitated and pulled out her core track  eICU Interventions  QT prolongation noted, one-time olanzapine . Routine EKG to reassess QTc    2221-can hold her p.m. meds for free water  flushes.  Anticipate replacement of tube tomorrow  0105 - D10 infusion secondary to hypoglycemia without tube feeds  0513 -again Minor hypoglycemia despite dextrose  infusion.  At increased dextrose  to 20 cc/h.  Maintain facility hypoglycemia protocol.     Keanna Tugwell 06/03/2023, 8:56 PM

## 2023-06-03 NOTE — Progress Notes (Signed)
 Heart Failure Navigator Progress Note  Assessed for Heart & Vascular TOC clinic readiness.  Patient does not meet criteria due to Advanced Heart Failure Team consulted..   Navigator will sign off at this time.    Stephane Haddock, BSN, Scientist, clinical (histocompatibility and immunogenetics) Only

## 2023-06-03 NOTE — Progress Notes (Signed)
 Speech Language Pathology Treatment: Dysphagia  Patient Details Name: Morgan Morgan MRN: [id number] DOB: May 21, 1950 Today's Date: 06/03/2023 Time:  -     Assessment / Plan / Recommendation Clinical Impression  [name] was alert and able to achieve very low volume phonation today.  Condition of oral cavity clean.  HOB elevated and she accepted several ice chips, demonstrating active mastication and palpable swallow. Trials of 1/2 tspns of thin liquids were followed by immediate coughing on all attempts. Oral suctioning removed minimal secretions - pt had difficulty expelling into oral cavity.  She is not yet ready for an oral diet or instrumental swallow study.  Recommend that she is offered 3-4 ice chips at two hour intervals after oral care and when feasible for staff.  SLP will follow.    HPI HPI: 73 year old woman presented via EMS on 4/9 as a code stroke after being found down by her grandson.  Intubated 4/9-4/17.  Head CT and CT angio was negative as well as UDS. Developed fever, MS with no improvement. Palliative care consulted.  Pt with improved MS 4/17; extubated, able to follow commands intermittently.      SLP Plan  Continue with current plan of care      Recommendations for follow up therapy are one component of a multi-disciplinary discharge planning process, led by the attending physician.  Recommendations may be updated based on patient status, additional functional criteria and insurance authorization.    Recommendations  Diet recommendations: NPO Medication Administration: Via alternative means                  Oral care QID     Dysphagia, oropharyngeal phase (R13.12)     Continue with current plan of care    Morgan Morgan L. Vona, MA CCC/SLP Clinical Specialist - Acute Care SLP Acute Rehabilitation Services Office number [phone]  Morgan Morgan Morgan Morgan  06/03/2023, 2:17 PM

## 2023-06-03 NOTE — Evaluation (Signed)
 Occupational Therapy Evaluation Patient Details Name: Morgan Morgan MRN: [id number] DOB: [birth year] Today's Date: 06/03/2023   History of Present Illness   73 y.o. female presents to [facility] 05/25/23 after falling at home with AMS. Code stroke activated with no remarkable findings. 4/10 transfer to ICU. 4/10-4/17 intubated. Pt with meningoencephalitis, positive rhinovirus/enterovirus, L2-L3 osteomyelitis/discitis. PMHx: asthma, HTN     Clinical Impressions Per pt report and chart review, at baseline, pt is Independent with ADLs, IADLs, and drives. 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 Pt now presents with decreased cognition, impulsiveness and restlessness, decreased balance, impaired cardiopulmonary status, and decreased safety and independence with functional tasks. Pt currently requires Max to Total assist +2 for ADLs, bed mobility, and STS transfer at EOB with pt unable to reach a full upright posture this session. Pt BP 111/61, 70 bpm, and SpO2 92% on 3L when pleth reading was accurate during session. Pt will benefit from acute skilled OT services to address deficits outlined below and increase safety and independence with functional tasks. Post acute discharge, pt will benefit from intensive inpatient skilled rehab services < 3 hours per day to maximize rehab potential.      If plan is discharge home, recommend the following:   Two people to help with walking and/or transfers;Two people to help with bathing/dressing/bathroom;Assistance with cooking/housework;Assistance with feeding;Direct supervision/assist for medications management;Direct supervision/assist for financial management;Assist for transportation;Help with stairs or ramp for entrance;Supervision due to cognitive status     Functional Status Assessment   Patient has had a recent decline in their functional status and demonstrates the ability to make significant improvements in function in a reasonable  and predictable amount of time.     Equipment Recommendations   Wheelchair (measurements OT);Wheelchair cushion (measurements OT);BSC/3in1 (Other TBD based on pt progress)     Recommendations for Other Services         Precautions/Restrictions   Precautions Precautions: Fall Recall of Precautions/Restrictions: Impaired Precaution/Restrictions Comments: mitts, NG tube, L IJ CVC Restrictions Weight Bearing Restrictions Per Provider Order: No     Mobility Bed Mobility Overal bed mobility: Needs Assistance Bed Mobility: Rolling, Sidelying to Sit, Sit to Supine Rolling: Max assist, +2 for physical assistance, +2 for safety/equipment Sidelying to sit: Max assist, +2 for physical assistance, +2 for safety/equipment, HOB elevated   Sit to supine: Total assist, +2 for physical assistance, +2 for safety/equipment   General bed mobility comments: pt able to slightly move LEs towards EOB. MaxAx2 to complete movement and raise trunk. Use of bed pad to shift hips forwad with ModAx2    Transfers Overall transfer level: Needs assistance Equipment used: 2 person hand held assist Transfers: Sit to/from Stand Sit to Stand: Max assist, +2 physical assistance, +2 safety/equipment           General transfer comment: x2 attempts at standing with pt unable to reach upright posture. Able to stand for <3 secs before lowering to the bed. MaxAx2 with use of gait belt and bed pad      Balance Overall balance assessment: Needs assistance, Mild deficits observed, not formally tested, History of Falls Sitting-balance support: Feet supported, Bilateral upper extremity supported Sitting balance-Leahy Scale: Fair     Standing balance support: Bilateral upper extremity supported, During functional activity, Reliant on assistive device for balance Standing balance-Leahy Scale: Zero Standing balance comment: unable to maintain balance without MaxAx2  ADL  either performed or assessed with clinical judgement   ADL Overall ADL's : Needs assistance/impaired Eating/Feeding: Maximal assistance;NPO Eating/Feeding Details (indicate cue type and reason): Pt NPO except for ice chips. Requires Max assist to eat ice chips with spoon Grooming: Maximal assistance;Cueing for safety;Cueing for sequencing;Bed level;Wash/dry hands;Wash/dry face   Upper Body Bathing: Maximal assistance;Total assistance;Cueing for safety;Cueing for sequencing;Bed level (+2 for safety)   Lower Body Bathing: Total assistance;+2 for physical assistance;+2 for safety/equipment;Bed level   Upper Body Dressing : Maximal assistance;Total assistance;Bed level;Cueing for safety;Cueing for sequencing   Lower Body Dressing: Total assistance;+2 for physical assistance;+2 for safety/equipment;Bed level     Toilet Transfer Details (indicate cue type and reason): pt unable at this time Toileting- Architect and Hygiene: Total assistance;+2 for physical assistance;+2 for safety/equipment;Bed level         General ADL Comments: Pt requiring Max cues for safety and sequencing throughout session.     Vision Patient Visual Report:  (pt unable to report) Vision Assessment?: No apparent visual deficits Additional Comments: Vision appears [facility] during funcitonal tasks this session, but with pt currently unable to follow instructions for vision screen     Perception         Praxis         Pertinent Vitals/Pain Pain Assessment Pain Assessment: Faces Faces Pain Scale: Hurts little more Pain Location: bottom with peri care Pain Descriptors / Indicators: Discomfort, Grimacing Pain Intervention(s): Limited activity within patient's tolerance, Monitored during session, Repositioned     Extremity/Trunk Assessment Upper Extremity Assessment Upper Extremity Assessment: Left hand dominant;Difficult to assess due to impaired cognition;Overall WFL for tasks assessed (Pt with  spontanious movement/AROM of B UE WFL. Pt B UE strength also appears WFL with pt spontaniouslly rolling in bed with use of rails and pushing up on bed with B UE at EOB for STS attempts during session. Pt unable to follow instructions for MMT.)   Lower Extremity Assessment Lower Extremity Assessment: Defer to PT evaluation RLE Deficits / Details: Hip flexion 3/5, Knee ext 4+/5, ankle DF- 4+/5 LLE Deficits / Details: Hip flexion 3/5, Knee ext 4+/5, ankle DF- 4+/5   Cervical / Trunk Assessment Cervical / Trunk Assessment: Other exceptions Cervical / Trunk Exceptions: increased body habitus   Communication Communication Communication: Impaired Factors Affecting Communication: Reduced clarity of speech   Cognition Arousal: Alert Behavior During Therapy: Impulsive, Restless Cognition: Cognition impaired   Orientation impairments: Place, Time, Situation Awareness: Intellectual awareness impaired, Online awareness impaired Memory impairment (select all impairments): Short-term memory, Working civil service fast streamer, Conservation officer, historic buildings Attention impairment (select first level of impairment): Selective attention Executive functioning impairment (select all impairments): Organization, Sequencing, Reasoning, Problem solving OT - Cognition Comments: Pt oriented to self only. Pt with poor insight into deficits and poor safety awareness. Pt currently requiring B mitts due to pulling at lines and NG tube.                 Following commands: Impaired Following commands impaired: Follows one step commands inconsistently, Follows one step commands with increased time     Cueing  General Comments   Cueing Techniques: Verbal cues;Tactile cues  BP 111/61, 70 BPM  SpO2 92% on 3L when pleth reading was accurate; RN present and assisting during a portion of session   Exercises     Shoulder Instructions      Home Living Family/patient expects to be discharged to:: Private residence Living  Arrangements:  (7 yo grandson, cousin) Available Help at Discharge:  Family;Available PRN/intermittently Type of Home: House Home Access: Level entry     Home Layout: One level     Bathroom Shower/Tub: Producer, Television/film/video: Handicapped height     Home Equipment: Shower seat - built in;Grab bars - tub/shower;Grab bars - toilet   Additional Comments: Home set up per pt report and chart review (limited information available). Pt appears to be an unrelable historian at this time as pt provides some conflicting information and presents with decreased cogntion.      Prior Functioning/Environment Prior Level of Function : Independent/Modified Independent;Working/employed;Driving;Patient poor historian/Family not available             Mobility Comments: Per pt report, pt is Independent without an AD ADLs Comments: Per pt report, pt is Independent with ADLs and IADLs, works, and drives. Pt unable to report type of work/job. Pt also reports her grandson is 73 yo. However, chart review states pt is the primary caregiver for her 74 yo grandson.    OT Problem List: Decreased activity tolerance;Impaired balance (sitting and/or standing);Decreased cognition;Decreased safety awareness;Decreased knowledge of use of DME or AE;Decreased knowledge of precautions;Cardiopulmonary status limiting activity   OT Treatment/Interventions: Self-care/ADL training;Therapeutic exercise;DME and/or AE instruction;Therapeutic activities;Cognitive remediation/compensation;Patient/family education;Balance training      OT Goals(Current goals can be found in the care plan section)   Acute Rehab OT Goals Patient Stated Goal: pt unable to state OT Goal Formulation: Patient unable to participate in goal setting Time For Goal Achievement: 06/17/23 Potential to Achieve Goals: Fair ADL Goals Pt Will Perform Grooming: with min assist;sitting (sitting EOB for 3 or more minutes with Good balance) Pt Will  Perform Upper Body Bathing: with min assist;sitting Pt Will Perform Upper Body Dressing: sitting;with min assist Pt Will Transfer to Toilet: with mod assist;ambulating;bedside commode (with least restrictive AD) Pt Will Perform Toileting - Clothing Manipulation and hygiene: with mod assist;sitting/lateral leans;sit to/from stand Additional ADL Goal #1: Patient will demonstrate ability to appropriately follow 1-step instructions in 8/10 trials for increased safety and independence with functional tasks.   OT Frequency:  Min 2X/week    Co-evaluation PT/OT/SLP Co-Evaluation/Treatment: Yes Reason for Co-Treatment: Necessary to address cognition/behavior during functional activity;For patient/therapist safety;To address functional/ADL transfers PT goals addressed during session: Mobility/safety with mobility;Balance OT goals addressed during session: ADL's and self-care      AM-PAC OT 6 Clicks Daily Activity     Outcome Measure Help from another person eating meals?: Total Help from another person taking care of personal grooming?: A Lot Help from another person toileting, which includes using toliet, bedpan, or urinal?: Total Help from another person bathing (including washing, rinsing, drying)?: A Lot Help from another person to put on and taking off regular upper body clothing?: A Lot Help from another person to put on and taking off regular lower body clothing?: Total 6 Click Score: 9   End of Session Equipment Utilized During Treatment: Gait belt;Oxygen Nurse Communication: Mobility status;Other (comment) (Pt attempting to pull out NG tube when mitts are off; pt with poor insight into deficits and poor safety awareness; RN present and assisting during a portion of session)  Activity Tolerance: Treatment limited secondary to medical complications (Comment) (Pt limited by current cognitive status) Patient left: in bed;with call bell/phone within reach;with nursing/sitter in room;with  restraints reapplied  OT Visit Diagnosis: Other abnormalities of gait and mobility (R26.89);Other symptoms and signs involving cognitive function  Time: 1430-1501 OT Time Calculation (min): 31 min Charges:  OT General Charges $OT Visit: 1 Visit OT Evaluation $OT Eval Moderate Complexity: 1 Mod OT Treatments $Self Care/Home Management : 8-22 mins  Morgan Rockey HERO., OTR/L, MA Acute Rehab [phone]  Morgan Morgan 06/03/2023, 6:57 PM

## 2023-06-03 NOTE — Progress Notes (Signed)
 PHARMACY - ANTICOAGULATION CONSULT NOTE  Pharmacy Consult for heparin  Indication: atrial fibrillation  Allergies  Allergen Reactions   Zithromax [Azithromycin] Itching    Patient Measurements: Height: 5' 1 (154.9 cm) Weight: 90.1 kg (198 lb 10.2 oz) IBW/kg (Calculated) : 47.8 HEPARIN  DW (KG): 68.3  Vital Signs: Temp: 98 F (36.7 C) (04/18 0330) Temp Source: Oral (04/18 0330) BP: 120/58 (04/18 0500) Pulse Rate: 61 (04/18 0530)  Labs: Recent Labs    06/01/23 0401 06/01/23 1125 06/02/23 0411 06/03/23 0235  HGB 10.1*  --   --  10.1*  HCT 30.4*  --   --  32.4*  PLT 286  --   --  401*  HEPARINUNFRC 0.84* 0.63 0.68 0.75*  CREATININE 0.79  --  0.79 0.64    Estimated Creatinine Clearance: 64 mL/min (by C-G formula based on SCr of 0.64 mg/dL).   Medical History: Past Medical History:  Diagnosis Date   Asthma    Hypertension     Medications:  Scheduled:   arformoterol   15 mcg Nebulization BID   budesonide  (PULMICORT ) nebulizer solution  0.25 mg Nebulization BID   Chlorhexidine  Gluconate Cloth  6 each Topical Daily   famotidine   20 mg Per Tube BID   feeding supplement (PROSource TF20)  60 mL Per Tube BID   free water   200 mL Per Tube Q6H   insulin  aspart  0-9 Units Subcutaneous Q4H   mouth rinse  15 mL Mouth Rinse 4 times per day   polyethylene glycol  17 g Per Tube Daily   thiamine  (VITAMIN B1) injection  100 mg Intravenous Daily    Assessment: 72 yom presenting with AMS. No AC PTA. Found to be in Afib this admission, heparin  per pharmacy ordered.  Heparin  level this morning came back therapeutic at 0.68, on 1600 units/hr. No s/sx of bleeding or infusion issues. RN noted what looked like old oozing at IV site.  4/18 AM update:  Heparin  level supra-therapeutic   Goal of Therapy:  Heparin  level 0.3-0.7 units/ml Monitor platelets by anticoagulation protocol: Yes   Plan:  -Dec heparin  to 1450 units/hr -Heparin  level in 8 hours  Lynwood Mckusick, PharmD,  BCPS Clinical Pharmacist Phone: [phone]

## 2023-06-03 NOTE — Progress Notes (Signed)
 There was ordered for placing PICC. Patient stared to agitate put on the drape for sterile procedure. She pulled drape out and twisted her Rt.arm to move around. Notified to patient's RN to give patient for calming down. Patient's RN gave a haldol , but patient was getting agitated worst than before. Couldn't put in the PICC at this time. Informed patient's RN for may need other medications for calming her down before put in the PICC. Will try again later. HS [employer]

## 2023-06-03 NOTE — Progress Notes (Signed)
 OT Cancellation Note  Patient Details Name: Morgan Morgan MRN: [id number] DOB: 10-23-1950   Cancelled Treatment:    Reason Eval/Treat Not Completed: Other (comment) (OT attempted to see pt for eval. However, MD/medical team in room for procedure. Acute OT to re-attempt to see pt at a later time as appropriate/available.)  [name] "[name]" M., OTR/L, MA Acute Rehab [phone]  Walt Gunner 06/03/2023, 12:18 PM

## 2023-06-04 DIAGNOSIS — I5021 Acute systolic (congestive) heart failure: Secondary | ICD-10-CM | POA: Diagnosis not present

## 2023-06-04 DIAGNOSIS — M4646 Discitis, unspecified, lumbar region: Secondary | ICD-10-CM | POA: Diagnosis not present

## 2023-06-04 LAB — CBC
HCT: 34.4 % — ABNORMAL LOW (ref 36.0–46.0)
Hemoglobin: 10.8 g/dL — ABNORMAL LOW (ref 12.0–15.0)
MCH: 27.9 pg (ref 26.0–34.0)
MCHC: 31.4 g/dL (ref 30.0–36.0)
MCV: 88.9 fL (ref 80.0–100.0)
Platelets: 432 10*3/uL — ABNORMAL HIGH (ref 150–400)
RBC: 3.87 MIL/uL (ref 3.87–5.11)
RDW: 16.7 % — ABNORMAL HIGH (ref 11.5–15.5)
WBC: 12.2 10*3/uL — ABNORMAL HIGH (ref 4.0–10.5)
nRBC: 0.4 % — ABNORMAL HIGH (ref 0.0–0.2)

## 2023-06-04 LAB — GLUCOSE, CAPILLARY
Glucose-Capillary: 107 mg/dL — ABNORMAL HIGH (ref 70–99)
Glucose-Capillary: 53 mg/dL — ABNORMAL LOW (ref 70–99)
Glucose-Capillary: 53 mg/dL — ABNORMAL LOW (ref 70–99)
Glucose-Capillary: 53 mg/dL — ABNORMAL LOW (ref 70–99)
Glucose-Capillary: 58 mg/dL — ABNORMAL LOW (ref 70–99)
Glucose-Capillary: 71 mg/dL (ref 70–99)
Glucose-Capillary: 73 mg/dL (ref 70–99)
Glucose-Capillary: 78 mg/dL (ref 70–99)
Glucose-Capillary: 82 mg/dL (ref 70–99)
Glucose-Capillary: 86 mg/dL (ref 70–99)
Glucose-Capillary: 90 mg/dL (ref 70–99)

## 2023-06-04 LAB — BASIC METABOLIC PANEL WITH GFR
Anion gap: 10 (ref 5–15)
BUN: 22 mg/dL (ref 8–23)
CO2: 26 mmol/L (ref 22–32)
Calcium: 8.6 mg/dL — ABNORMAL LOW (ref 8.9–10.3)
Chloride: 109 mmol/L (ref 98–111)
Creatinine, Ser: 0.63 mg/dL (ref 0.44–1.00)
GFR, Estimated: >60 mL/min (ref >60)
Glucose, Bld: 107 mg/dL — ABNORMAL HIGH (ref 70–99)
Potassium: 3.9 mmol/L (ref 3.5–5.1)
Sodium: 145 mmol/L (ref 135–145)

## 2023-06-04 LAB — PHOSPHORUS: Phosphorus: 3.6 mg/dL (ref 2.5–4.6)

## 2023-06-04 LAB — MAGNESIUM: Magnesium: 2.3 mg/dL (ref 1.7–2.4)

## 2023-06-04 MED ORDER — DEXTROSE 50 % IV SOLN
1.0000 | Freq: Once | INTRAVENOUS | Status: AC
  Administered 2023-06-04: 50 mL via INTRAVENOUS
  Filled 2023-06-04: qty 50

## 2023-06-04 MED ORDER — DEXTROSE 50 % IV SOLN: 12.5000 g | INTRAVENOUS | Status: DC

## 2023-06-04 MED ORDER — ACETAMINOPHEN 650 MG RE SUPP: 650.0000 mg | Freq: Three times a day (TID) | RECTAL | Status: DC | PRN

## 2023-06-04 MED ORDER — DEXTROSE 50 % IV SOLN
25.0000 g | INTRAVENOUS | Status: AC
Start: 2023-06-04 — End: 2023-06-04

## 2023-06-04 MED ORDER — AMIODARONE HCL 200 MG PO TABS: 200.0000 mg | ORAL_TABLET | Freq: Every day | ORAL | Status: DC

## 2023-06-04 MED ORDER — CARVEDILOL 3.125 MG PO TABS
3.1250 mg | ORAL_TABLET | Freq: Two times a day (BID) | ORAL | Status: DC
  Administered 2023-06-04 – 2023-06-10 (×13): 3.125 mg via ORAL
  Filled 2023-06-04 (×14): qty 1

## 2023-06-04 MED ORDER — DEXTROSE 10 % IV SOLN: INTRAVENOUS | Status: AC

## 2023-06-04 MED ORDER — DEXTROSE 50 % IV SOLN
12.5000 g | Freq: Once | INTRAVENOUS | Status: AC
  Administered 2023-06-04: 12.5 g via INTRAVENOUS
  Filled 2023-06-04: qty 50

## 2023-06-04 MED ORDER — AMIODARONE HCL 200 MG PO TABS
200.0000 mg | ORAL_TABLET | Freq: Two times a day (BID) | ORAL | Status: DC
  Administered 2023-06-04 – 2023-06-10 (×12): 200 mg via ORAL
  Filled 2023-06-04 (×13): qty 1

## 2023-06-04 MED ORDER — DEXTROSE 50 % IV SOLN
25.0000 g | INTRAVENOUS | Status: AC
  Administered 2023-06-04: 25 g via INTRAVENOUS
  Filled 2023-06-04: qty 50

## 2023-06-04 MED ORDER — SODIUM CHLORIDE 0.9% FLUSH: 10.0000 mL | INTRAVENOUS | Status: DC | PRN

## 2023-06-04 MED ORDER — DEXTROSE 50 % IV SOLN
INTRAVENOUS | Status: AC
Start: 2023-06-04 — End: 2023-06-04
  Administered 2023-06-04: 50 mL
  Filled 2023-06-04: qty 50

## 2023-06-04 NOTE — Plan of Care (Signed)
   Problem: Nutrition: Goal: Adequate nutrition will be maintained Outcome: Not Progressing

## 2023-06-04 NOTE — Progress Notes (Signed)
 RN notified MD and made MD aware of hypoglycemic event.

## 2023-06-04 NOTE — Plan of Care (Signed)
   Problem: Education: Goal: Knowledge of General Education information will improve Description: Including pain rating scale, medication(s)/side effects and non-pharmacologic comfort measures Outcome: Progressing   Problem: Health Behavior/Discharge Planning: Goal: Ability to manage health-related needs will improve Outcome: Progressing   Problem: Clinical Measurements: Goal: Will remain free from infection Outcome: Progressing Goal: Diagnostic test results will improve Outcome: Progressing Goal: Respiratory complications will improve Outcome: Progressing Goal: Cardiovascular complication will be avoided Outcome: Progressing   Problem: Activity: Goal: Risk for activity intolerance will decrease Outcome: Progressing   Problem: Nutrition: Goal: Adequate nutrition will be maintained Outcome: Progressing

## 2023-06-04 NOTE — Progress Notes (Signed)
 Found patient with cortrak hanging by the bed. She is ANO X1.Seems confused and agitated,trying to pull all her lines.E-link called.Ordered for 5mg  of IV  Zyprex.Held all her night medication as per MD's order.

## 2023-06-04 NOTE — Progress Notes (Signed)
 Hypoglycemic Event  CBG: 65  Treatment: D50 25 mL (12.5 gm)  Symptoms: None  Follow-up CBG: Time:0019 am and 0033 am CBG Result:78 mg/dl and 90 mg/dl  Possible Reasons for Event:Tube feeding was stopped since patient pulled her cortrak out.        Comments/MD notified:Called E-link.Pt has a order for continuous 10% dextrose  @ 10ml.    [facility]

## 2023-06-04 NOTE — Progress Notes (Signed)
   06/04/23 1735  Vitals  Temp 97.6 F (36.4 C)  Temp Source Oral  BP 137/61  MAP (mmHg) 84  BP Location Right Arm  BP Method Automatic  Patient Position (if appropriate) Lying  Pulse Rate 78  Pulse Rate Source Monitor  Resp 20  MEWS COLOR  MEWS Score Color Green  Oxygen Therapy  SpO2 100 %  O2 Device Nasal Cannula  O2 Flow Rate (L/min) 4 L/min  MEWS Score  MEWS Temp 0  MEWS Systolic 0  MEWS Pulse 0  MEWS RR 0  MEWS LOC 0  MEWS Score 0   Patient arrived via transport from [facility], complaining about headache. Tele monitor applied and CCMD notified of transfer. NT obtained CBG, CBG is 53. RN applied hypoglycemia protocol and adminstered 25g of D50. RN to recheck CBG in 15 mins. RN attempted to notified MD twice, both unsuccessful.

## 2023-06-04 NOTE — Progress Notes (Signed)
 Hypoglycemic Event  CBG: 53  Treatment: D50 50 mL (25 gm)  Symptoms:  Headache  Follow-up CBG: Time:1819 CBG Result:107  Possible Reasons for Event:  Patient removed cortrak prior, poor oral intake.  Comments/MD notified:MD paged twice and unsuccessful notification. RN administered IV D50 25g per protocol.     Morgan Morgan

## 2023-06-04 NOTE — Progress Notes (Signed)
 Cardiology Progress Note  Patient ID: Morgan Morgan MRN: [id number] DOB: 12/25/1950 Date of Encounter: 06/04/2023 Primary Cardiologist: None  Subjective   Chief Complaint: none.   HPI: No complaints this morning.  Resting comfortably in her room.  ROS:  All other ROS reviewed and negative. Pertinent positives noted in the HPI.     Telemetry  Overnight telemetry shows sinus rhythm 60s, which I personally reviewed.   Physical Exam   Vitals:   06/04/23 0316 06/04/23 0800 06/04/23 0817 06/04/23 0819  BP: (!) 109/57 (!) 124/58    Pulse: 88 71    Resp: 18 18    Temp: 98.4 F (36.9 C) 98.3 F (36.8 C)    TempSrc: Axillary Oral    SpO2: 96% 97% 98% 98%  Weight: 90.3 kg     Height:        Intake/Output Summary (Last 24 hours) at 06/04/2023 1124 Last data filed at 06/03/2023 1800 Gross per 24 hour  Intake 436.28 ml  Output 753 ml  Net -316.72 ml       06/04/2023    3:16 AM 06/03/2023    4:57 AM 06/02/2023    4:46 AM  Last 3 Weights  Weight (lbs) 199 lb 1.2 oz 198 lb 10.2 oz 190 lb 11.2 oz  Weight (kg) 90.3 kg 90.1 kg 86.5 kg    Body mass index is 37.62 kg/m.  General: Well nourished, well developed, in no acute distress Head: Atraumatic, normal size  Eyes: PEERLA, EOMI  Neck: Supple, no JVD Endocrine: No thryomegaly Cardiac: Normal S1, S2; RRR; no murmurs, rubs, or gallops Lungs: Clear to auscultation bilaterally, no wheezing, rhonchi or rales  Abd: Soft, nontender, no hepatomegaly  Ext: No edema, pulses 2+ Musculoskeletal: No deformities, BUE and BLE strength normal and equal Skin: Warm and dry, no rashes   Neuro: Alert, awake, no gross deficits  Cardiac Studies  TTE 05/26/2023  1. Left ventricular ejection fraction, by estimation, is 20 to 25%. The  left ventricle has severely decreased function. The left ventricle  demonstrates global hypokinesis. There is mild left ventricular  hypertrophy. Left ventricular diastolic parameters   are indeterminate.   2. Right  ventricular systolic function is normal. The right ventricular  size is normal. There is normal pulmonary artery systolic pressure.   3. Left atrial size was severely dilated.   4. The mitral valve is myxomatous. Trivial mitral valve regurgitation. No  evidence of mitral stenosis.   5. The aortic valve is calcified. Aortic valve regurgitation is not  visualized. Aortic valve sclerosis/calcification is present, without any  evidence of aortic stenosis.   6. The inferior vena cava is normal in size with greater than 50%  respiratory variability, suggesting right atrial pressure of 3 mmHg.    Patient Profile  Morgan Morgan is a 73 y.o. female with asthma and hypertension who was admitted on 05/25/2023 with encephalopathy, rhinovirus and concerns for meningeal encephalitis.  Cardiology was consulted due to new onset systolic heart failure.  Assessment & Plan   # Acute systolic heart failure, EF 20-25% with global hypokinesis - Thought to be secondary to acute illness.  Cardiology evaluated in ICU and point-of-care ultrasound showed an EF of 40 to 45%.  EF reduction thought to be related to critical illness. - No signs of volume overload. - Continue Aldactone  12.5 mg daily. - Add Coreg  3.125 mg twice daily. - We will plan to repeat her echocardiogram on Monday as recommended by the advanced heart failure team. - No  need for Lasix .  She is euvolemic.  # Sinus rhythm with PACs versus atrial fibrillation - Initially thought to be in A-fib.  Evaluated by cardiology in the ICU.  They believe this is sinus rhythm with PACs. - Eliquis  has been stopped. - Continue amiodarone  for now. May stop this as we are starting a beta blocker.  - consider outpatient monitor   # Encephalopathy # Meningitis - Antibiotics per primary team.      For questions or updates, please contact [facility] Please consult [email or web address] for contact info under      Signed, Melodee Spruce T. Rolm Clos, MD, [facility]  [facility]  06/04/2023 11:24 AM

## 2023-06-04 NOTE — Progress Notes (Addendum)
 HD#10 SUBJECTIVE:  Patient Summary: Morgan Morgan is a 73 y.o. with a pertinent PMH of asthma and hypertension who presented with AMS and was admitted for acute encephalopathy and lumbar discitis.   Overnight Events: NAEO  Interim History: Patient was evaluated at bedside.  Oriented to person and place but not to date or reason for hospitalization.  She endorsed a mild headache this morning that is unchanged. She denies any fevers, chills, shortness of breath, troubles breathing, chest pain, or any other signs or symptoms.  OBJECTIVE:  Vital Signs: Vitals:   06/04/23 0316 06/04/23 0800 06/04/23 0817 06/04/23 0819  BP: (!) 109/57 (!) 124/58    Pulse: 88 71    Resp: 18 18    Temp: 98.4 F (36.9 C) 98.3 F (36.8 C)    TempSrc: Axillary Oral    SpO2: 96% 97% 98% 98%  Weight: 90.3 kg     Height:       Supplemental O2: Nasal Cannula SpO2: 98 % O2 Flow Rate (L/min): 3 L/min FiO2 (%): 40 %  Filed Weights   06/02/23 0446 06/03/23 0457 06/04/23 0316  Weight: 86.5 kg 90.1 kg 90.3 kg     Intake/Output Summary (Last 24 hours) at 06/04/2023 0853 Last data filed at 06/03/2023 1800 Gross per 24 hour  Intake 762.02 ml  Output 753 ml  Net 9.02 ml   Net IO Since Admission: 4,170.39 mL [06/04/23 0853]  Physical Exam: Physical Exam Constitutional:      Appearance: Normal appearance.  HENT:     Head: Normocephalic and atraumatic.  Eyes:     Pupils: Pupils are equal, round, and reactive to light.  Cardiovascular:     Rate and Rhythm: Normal rate and regular rhythm.     Pulses: Normal pulses.     Heart sounds: Normal heart sounds.  Pulmonary:     Effort: Pulmonary effort is normal.     Breath sounds: Normal breath sounds.  Abdominal:     General: Abdomen is flat. Bowel sounds are normal.     Palpations: Abdomen is soft.  Neurological:     Mental Status: She is alert.     Comments: Oriented to person and place but not to date or reason for hospitalization    CBC     Component Value Date/Time   WBC 14.2 (H) 06/03/2023 0235   RBC 3.65 (L) 06/03/2023 0235   HGB 10.1 (L) 06/03/2023 0235   HCT 32.4 (L) 06/03/2023 0235   PLT 401 (H) 06/03/2023 0235   MCV 88.8 06/03/2023 0235   MCH 27.7 06/03/2023 0235   MCHC 31.2 06/03/2023 0235   RDW 16.4 (H) 06/03/2023 0235   LYMPHSABS 0.6 (L) 05/26/2023 0545   MONOABS 1.4 (H) 05/26/2023 0545   EOSABS 0.0 05/26/2023 0545   BASOSABS 0.1 05/26/2023 0545   CMP     Component Value Date/Time   NA 148 (H) 06/03/2023 0235   K 3.8 06/03/2023 0235   CL 110 06/03/2023 0235   CO2 32 06/03/2023 0235   GLUCOSE 148 (H) 06/03/2023 0235   BUN 34 (H) 06/03/2023 0235   CREATININE 0.64 06/03/2023 0235   CALCIUM  8.6 (L) 06/03/2023 0235   PROT 5.7 (L) 05/27/2023 0409   ALBUMIN 2.0 (L) 05/27/2023 0409   AST 30 05/27/2023 0409   ALT 15 05/27/2023 0409   ALKPHOS 70 05/27/2023 0409   BILITOT 0.5 05/27/2023 0409   GFRNONAA >60 06/03/2023 0235   ASSESSMENT/PLAN:  Assessment: Principal Problem:   Lumbar discitis Active Problems:  Altered mental status   Acute encephalopathy   Rhinovirus   Septic shock (HCC)   Osteomyelitis of lumbar spine (HCC)   On mechanically assisted ventilation (HCC)   Acute respiratory failure with hypoxia (HCC)   Diskitis   Bacterial meningitis   Plan: #Meningitis with discitis #Acute encephalopathy, improving ID following. Leukocytosis has been stable ~14 with no leukocytosis over the past few days. Mental status much improved this morning as patient was able to participate in the assessment. Continuing on 6-week course of therapy with 2 weeks of Trexan 2 g IV and vancomycin  for CNS penetration. This is to be followed by 4 weeks of CTX 2 g daily and daptomycin  500 mg IV daily. - Follow-up ID recs - Continue IV vancomycin  750 mg every 12 hours and her Rocephin  2 g every 12 hours through 4/22 followed by IV Dapto 500 mg every 24 hours and Rocephin  2 g every 24 hours through  5/20  #Hypoglycemia #Poor p.o. intake Glucose noted to have been in the 60s to 90s.  I suspect that this is from her poor p.o. intake from her current illness.  She has received intermittent infusion of dextrose  50% for her hypoglycemia. She was evaluated by speech yesterday in which the patient was unable to tolerate thin liquids as this was followed by immediate coughing.  At that time, n.p.o. diet was suggested. At bedside this morning, she was able to drink water , so will trial liquid diet. Patient is also being followed by nutrition [employer] feeds in which she is tolerating tube feeds well. - Trend glucose - Follow-up speech recs - Trial liquid diet  - Continue dextrose  10% infusion - As needed dextrose  50% solution 12.5 g for episodes of hypoglycemia  #Acute systolic heart failure Cardiology following.  Echo on 05/26/2023 noted EF of 20 to 25% and global hypokinesis.  POCUS yesterday demonstrated improvement of EF of 40 to 45%.  Suspicion for nonischemic cardiomyopathy secondary to her underlying disease.  Appears euvolemic on assessment today with clear lungs.  Planning for repeat echo on Monday if her encephalopathy continues to improve.  If her echo remains low, will consider cardiac MRI. - Follow-up cardiology recs - Continue Spiro 12.5 mg daily - Repeat echo on Monday  #Atrial fibrillation versus frequent PACs, resolved Evaluated by cardiology.  On arrival, patient was thought to have been in atrial fibrillation.  Per cardiology, patient suspected to have been in sinus with frequent PACs.  For this reason, amiodarone  was resumed to help suppress atrial ectopy.  There is doubt that this was from atrial fibrillation, so cardiology recommending stopping Eliquis  and resuming GDMT as tolerated. - Follow-up cardiology recs - Continue amiodarone  200 mg twice daily - Stop Eliquis   #Acute Hypoxic Respiratory Failure 2/2 encephalopathy, improving Continue on Brovana  and Pulmicort   Best  Practice: Diet: Liquid diet  IVF: Fluids: D10, Rate:  20 cc/hr x continuous VTE: SCDs Start: 05/25/23 1528 Code: Full AB: Vanc, rocephin , dapto  Therapy Recs: SNF, DISPO: Anticipated discharge  TBD  to Skilled nursing facility pending  IV antibiotics and mental status .  Signature: Maxie Spaniel, MD Internal Medicine Resident, PGY-1 [facility]  Pager: [phone]  Please contact the on call pager after 5 pm and on weekends at [phone].

## 2023-06-05 ENCOUNTER — Other Ambulatory Visit: Payer: Self-pay

## 2023-06-05 DIAGNOSIS — I491 Atrial premature depolarization: Secondary | ICD-10-CM | POA: Diagnosis not present

## 2023-06-05 DIAGNOSIS — M4646 Discitis, unspecified, lumbar region: Secondary | ICD-10-CM | POA: Diagnosis not present

## 2023-06-05 DIAGNOSIS — I5021 Acute systolic (congestive) heart failure: Secondary | ICD-10-CM | POA: Diagnosis not present

## 2023-06-05 DIAGNOSIS — Z515 Encounter for palliative care: Secondary | ICD-10-CM | POA: Diagnosis not present

## 2023-06-05 LAB — CBC WITH DIFFERENTIAL/PLATELET
Abs Immature Granulocytes: 0 10*3/uL (ref 0.00–0.07)
Basophils Absolute: 0.3 10*3/uL — ABNORMAL HIGH (ref 0.0–0.1)
Basophils Relative: 3 %
Eosinophils Absolute: 0.3 10*3/uL (ref 0.0–0.5)
Eosinophils Relative: 3 %
HCT: 32.6 % — ABNORMAL LOW (ref 36.0–46.0)
Hemoglobin: 10.5 g/dL — ABNORMAL LOW (ref 12.0–15.0)
Lymphocytes Relative: 26 %
Lymphs Abs: 2.5 10*3/uL (ref 0.7–4.0)
MCH: 28.2 pg (ref 26.0–34.0)
MCHC: 32.2 g/dL (ref 30.0–36.0)
MCV: 87.4 fL (ref 80.0–100.0)
Monocytes Absolute: 0.8 10*3/uL (ref 0.1–1.0)
Monocytes Relative: 8 %
Neutro Abs: 5.9 10*3/uL (ref 1.7–7.7)
Neutrophils Relative %: 60 %
Platelets: 383 10*3/uL (ref 150–400)
RBC: 3.73 MIL/uL — ABNORMAL LOW (ref 3.87–5.11)
RDW: 16.5 % — ABNORMAL HIGH (ref 11.5–15.5)
WBC: 9.8 10*3/uL (ref 4.0–10.5)
nRBC: 0 /100{WBCs}
nRBC: 0.2 % (ref 0.0–0.2)

## 2023-06-05 LAB — BASIC METABOLIC PANEL WITH GFR
Anion gap: 9 (ref 5–15)
BUN: 15 mg/dL (ref 8–23)
CO2: 26 mmol/L (ref 22–32)
Calcium: 8.3 mg/dL — ABNORMAL LOW (ref 8.9–10.3)
Chloride: 105 mmol/L (ref 98–111)
Creatinine, Ser: 0.66 mg/dL (ref 0.44–1.00)
GFR, Estimated: >60 mL/min (ref >60)
Glucose, Bld: 148 mg/dL — ABNORMAL HIGH (ref 70–99)
Potassium: 3.4 mmol/L — ABNORMAL LOW (ref 3.5–5.1)
Sodium: 140 mmol/L (ref 135–145)

## 2023-06-05 LAB — GLUCOSE, CAPILLARY
Glucose-Capillary: 103 mg/dL — ABNORMAL HIGH (ref 70–99)
Glucose-Capillary: 108 mg/dL — ABNORMAL HIGH (ref 70–99)
Glucose-Capillary: 257 mg/dL — ABNORMAL HIGH (ref 70–99)
Glucose-Capillary: 68 mg/dL — ABNORMAL LOW (ref 70–99)
Glucose-Capillary: 80 mg/dL (ref 70–99)
Glucose-Capillary: 80 mg/dL (ref 70–99)

## 2023-06-05 MED ORDER — ACETAMINOPHEN 325 MG PO TABS: 650.0000 mg | ORAL_TABLET | Freq: Four times a day (QID) | ORAL | Status: DC | PRN

## 2023-06-05 MED ORDER — DEXTROSE 50 % IV SOLN
1.0000 | Freq: Once | INTRAVENOUS | Status: AC
  Administered 2023-06-05: 50 mL via INTRAVENOUS
  Filled 2023-06-05: qty 50

## 2023-06-05 MED ORDER — GUAIFENESIN 100 MG/5ML PO LIQD
5.0000 mL | ORAL | Status: DC | PRN
  Administered 2023-06-05 – 2023-06-06 (×2): 5 mL via ORAL
  Filled 2023-06-05 (×2): qty 10

## 2023-06-05 MED ORDER — ACETAMINOPHEN 325 MG PO TABS
650.0000 mg | ORAL_TABLET | Freq: Four times a day (QID) | ORAL | Status: DC | PRN
  Administered 2023-06-05 – 2023-06-10 (×9): 650 mg via ORAL
  Filled 2023-06-05 (×10): qty 2

## 2023-06-05 MED ORDER — POLYETHYLENE GLYCOL 3350 17 G PO PACK: 17.0000 g | PACK | Freq: Every day | ORAL | Status: DC | PRN

## 2023-06-05 MED ORDER — ACETAMINOPHEN 650 MG RE SUPP: 650.0000 mg | Freq: Four times a day (QID) | RECTAL | Status: DC | PRN

## 2023-06-05 MED ORDER — THIAMINE MONONITRATE 100 MG PO TABS
100.0000 mg | ORAL_TABLET | Freq: Every day | ORAL | Status: DC
  Administered 2023-06-05 – 2023-06-10 (×6): 100 mg via ORAL
  Filled 2023-06-05 (×6): qty 1

## 2023-06-05 MED ORDER — LOSARTAN POTASSIUM 25 MG PO TABS
12.5000 mg | ORAL_TABLET | Freq: Every day | ORAL | Status: DC
  Administered 2023-06-05 – 2023-06-08 (×4): 12.5 mg via ORAL
  Filled 2023-06-05 (×4): qty 1

## 2023-06-05 MED ORDER — THIAMINE MONONITRATE 100 MG PO TABS: 100.0000 mg | ORAL_TABLET | Freq: Every day | ORAL | Status: DC

## 2023-06-05 NOTE — Plan of Care (Signed)
 ?  Problem: Clinical Measurements: ?Goal: Respiratory complications will improve ?Outcome: Progressing ?Goal: Cardiovascular complication will be avoided ?Outcome: Progressing ?  ?Problem: Safety: ?Goal: Ability to remain free from injury will improve ?Outcome: Progressing ?  ?

## 2023-06-05 NOTE — Progress Notes (Signed)
 Hypoglycemic Event  CBG: 68  Treatment: D50 50 mL (25 gm)  Symptoms: None  Follow-up CBG: Time:0550 CBG Result:108  Possible Reasons for Event: Inadequate meal intake  Comments/MD notified:Dr. Vara Gentle  Vegas Coffin

## 2023-06-05 NOTE — Progress Notes (Signed)
 Hypoglycemic Event  CBG: 53  Treatment: D50 50 mL (25 gm)  Symptoms:  Headache  Follow-up CBG: Time:2308H CBG Result:71  Possible Reasons for Event: Inadequate meal intake  Comments/MD notified: Dr. Vara Gentle  Robbi Scurlock

## 2023-06-05 NOTE — Progress Notes (Signed)
   Palliative Medicine Inpatient Follow Up Note HPI: 73 year old woman BIBEMS as a code stroke after being found down by her grandson. Workup for encephalopathy is ongoing considerations inclusive of diskitis. Given the lack of neurological improvement since admission the PMT has been asked to have further goals of care conversations.   Today's Discussion 06/05/2023  *Please note that this is a verbal dictation therefore any spelling or grammatical errors are due to the "[employer]agon [employer] One" system interpretation.  Chart reviewed inclusive of vital signs, progress notes, laboratory results, and diagnostic images.   I met with Morgan Morgan at bedside this morning - she was aware of who she is, where she is, and the date. She does not know why she was hospitalized.   Morgan Morgan denies complaints of pain, SOB, or nausea.   We reviewed the plan moving forward inclusive of her need to increase nutritional intake. Discussed the plan to work with PT/OT.  Questions and concerns addressed/Palliative Support Provided.   Objective Assessment: Vital Signs Vitals:   06/05/23 1051 06/05/23 1205  BP: 123/62 (!) 115/55  Pulse:    Resp: 20 (!) 21  Temp: 98.5 F (36.9 C)   SpO2: 98%    No intake or output data in the 24 hours ending 06/05/23 1307  Last Weight  Most recent update: 06/05/2023  4:16 AM    Weight  99.5 kg (219 lb 5.7 oz)            Gen:  Elderly AA F acutely ill appearing HEENT: Dry mucous membranes CV: Regular rate and irregular rhythm  PULM: On mechanical ventilator ABD: soft/nontender  EXT: No edema  Neuro: Somnolent  SUMMARY OF RECOMMENDATIONS   Full Code/Full Scope of Care  Patient showing improvements daily  The PMT will sign off at this time though please re-consult us  if additional needs arise  Time Spent: 25 ______________________________________________________________________________________ Camille C[facility] Palliative Medicine Team Team Cell Phone:  [phone] Please utilize secure chat with additional questions, if there is no response within 30 minutes please call the above phone number  Palliative Medicine Team providers are available by phone from 7am to 7pm daily and can be reached through the team cell phone.  Should this patient require assistance outside of these hours, please call the patient's attending physician.

## 2023-06-05 NOTE — Progress Notes (Signed)
 HD#11 SUBJECTIVE:  Patient Summary: Morgan Morgan is a 73 y.o. with a pertinent PMH of asthma and hypertension who presented with AMS and was admitted for acute encephalopathy and lumbar discitis.   Overnight Events: Overnight, patient had an episode of hypoglycemia of 68 requiring 1 dose of D50.  Interim History: Patient was evaluated at bedside.  She was resting comfortably in bed.  She states that she is feeling better.  She was able to safely eat Jell-O and soup during her assessment.  Overall, her mental status continues to improve significantly from admission.  OBJECTIVE:  Vital Signs: Vitals:   06/05/23 0412 06/05/23 0414 06/05/23 0659 06/05/23 0730  BP: 127/72     Pulse: 67     Resp: 20  20   Temp: 98.4 F (36.9 C)     TempSrc: Oral     SpO2: 100%   100%  Weight:  99.5 kg    Height:       Supplemental O2: Nasal Cannula SpO2: 100 % O2 Flow Rate (L/min): 2 L/min FiO2 (%): 40 %  Filed Weights   06/03/23 0457 06/04/23 0316 06/05/23 0414  Weight: 90.1 kg 90.3 kg 99.5 kg    No intake or output data in the 24 hours ending 06/05/23 0828  Net IO Since Admission: 4,170.39 mL [06/05/23 0828]  Physical Exam Constitutional:      Appearance: Normal appearance.  HENT:     Head: Normocephalic and atraumatic.  Cardiovascular:     Rate and Rhythm: Normal rate and regular rhythm.  Pulmonary:     Effort: Pulmonary effort is normal.     Breath sounds: Normal breath sounds.  Abdominal:     General: Abdomen is flat. Bowel sounds are normal.     Palpations: Abdomen is soft.  Skin:    General: Skin is warm.  Neurological:     General: No focal deficit present.     Mental Status: She is alert.  Psychiatric:        Mood and Affect: Mood normal.        Behavior: Behavior normal.    CBC (Pending for 4/20)    Component Value Date/Time   WBC 12.2 (H) 06/04/2023 1136   RBC 3.87 06/04/2023 1136   HGB 10.8 (L) 06/04/2023 1136   HCT 34.4 (L) 06/04/2023 1136   PLT 432 (H)  06/04/2023 1136   MCV 88.9 06/04/2023 1136   MCH 27.9 06/04/2023 1136   MCHC 31.4 06/04/2023 1136   RDW 16.7 (H) 06/04/2023 1136   LYMPHSABS 0.6 (L) 05/26/2023 0545   MONOABS 1.4 (H) 05/26/2023 0545   EOSABS 0.0 05/26/2023 0545   BASOSABS 0.1 05/26/2023 0545   CMP (Pending for 4/20)    Component Value Date/Time   NA 145 06/04/2023 1136   K 3.9 06/04/2023 1136   CL 109 06/04/2023 1136   CO2 26 06/04/2023 1136   GLUCOSE 107 (H) 06/04/2023 1136   BUN 22 06/04/2023 1136   CREATININE 0.63 06/04/2023 1136   CALCIUM  8.6 (L) 06/04/2023 1136   PROT 5.7 (L) 05/27/2023 0409   ALBUMIN 2.0 (L) 05/27/2023 0409   AST 30 05/27/2023 0409   ALT 15 05/27/2023 0409   ALKPHOS 70 05/27/2023 0409   BILITOT 0.5 05/27/2023 0409   GFRNONAA >60 06/04/2023 1136   ASSESSMENT/PLAN:  Assessment: Principal Problem:   Lumbar discitis Active Problems:   Altered mental status   Acute encephalopathy   Rhinovirus   Septic shock (HCC)   Osteomyelitis of lumbar spine (  HCC)   On mechanically assisted ventilation (HCC)   Acute respiratory failure with hypoxia (HCC)   Diskitis   Bacterial meningitis  Plan: #Meningitis with discitis #Acute encephalopathy, improving Continues to follow with ID.  Remains afebrile.  Leukocytosis pending this a.m. but has been stable over the past few days.  Mental status continues to improve.  No changes to her antibiotic course and will pursue 6-week treatment with 2 weeks of Trexan 2 g IV and vancomycin  for CNS penetration. This will be followed by 4 weeks of CTX 2 g daily and daptomycin  500 mg IV daily. - Follow-up ID recs - Continue IV vancomycin  750 mg every 12 hours and her Rocephin  2 g every 12 hours through 4/22 followed by IV Dapto 500 mg every 24 hours and Rocephin  2 g every 24 hours through 5/20   #Hypoglycemia #Poor p.o. intake, improving Continues to have intermittent episodes of hypoglycemia requiring as needed dextrose  50.  She has tolerated her progressive  diet to clear liquids well.  At bedside this morning, she was able to safely eat her Jell-O and soup with no coughing observed.  Will continue to encourage p.o. feeds and advance her diet safely as tolerated. - Trend glucose with as needed dextrose  50% solution 12.5 g for episodes of hypoglycemia - Continue liquid diet - advance safely as tolerated  #Acute systolic heart failure Cardiology following.  Coreg  3.125 mg twice daily added yesterday.  Remains euvolemic with no signs of volume overload.  Plan for formal repeat echo tomorrow. If her HF remains low, will consider cardiac MRI. - Follow-up cardiology recs - Continue Spiro 12.5 mg daily and Coreg  3.125 mg twice daily - Repeat echo on Monday  #Atrial fibrillation versus frequent PACs, resolved Eliquis  stopped yesterday.  Remains in normal sinus rhythm.  May consider discontinuing amiodarone  after initiation of beta-blocker yesterday but will continue at this time. - Follow-up cardiology recs - Continue amiodarone  200 mg twice daily - Stop Eliquis   #Acute Hypoxic Respiratory Failure 2/2 encephalopathy, improving Continue on Brovana  and Pulmicort   Best Practice: Diet: Liquid diet  IVF: None VTE: SCDs Start: 05/25/23 1528 Code: Full AB: Vanc, rocephin , dapto  Therapy Recs: SNF, DISPO: Anticipated discharge  TBD  to Skilled nursing facility pending  IV antibiotics and mental status .  Signature: Maxie Spaniel, MD Internal Medicine Resident, PGY-1 [facility]  Pager: [phone]  Please contact the on call pager after 5 pm and on weekends at [phone].

## 2023-06-05 NOTE — Plan of Care (Signed)
   Problem: Education: Goal: Knowledge of General Education information will improve Description: Including pain rating scale, medication(s)/side effects and non-pharmacologic comfort measures Outcome: Progressing   Problem: Health Behavior/Discharge Planning: Goal: Ability to manage health-related needs will improve Outcome: Progressing   Problem: Clinical Measurements: Goal: Ability to maintain clinical measurements within normal limits will improve Outcome: Progressing Goal: Will remain free from infection Outcome: Progressing Goal: Diagnostic test results will improve Outcome: Progressing Goal: Respiratory complications will improve Outcome: Progressing Goal: Cardiovascular complication will be avoided Outcome: Progressing   Problem: Activity: Goal: Risk for activity intolerance will decrease Outcome: Progressing   Problem: Nutrition: Goal: Adequate nutrition will be maintained Outcome: Progressing   Problem: Coping: Goal: Level of anxiety will decrease Outcome: Progressing   Problem: Elimination: Goal: Will not experience complications related to bowel motility Outcome: Progressing Goal: Will not experience complications related to urinary retention Outcome: Progressing   Problem: Pain Managment: Goal: General experience of comfort will improve and/or be controlled Outcome: Progressing   Problem: Safety: Goal: Ability to remain free from injury will improve Outcome: Progressing   Problem: Skin Integrity: Goal: Risk for impaired skin integrity will decrease Outcome: Progressing   Problem: Activity: Goal: Ability to tolerate increased activity will improve Outcome: Progressing   Problem: Respiratory: Goal: Ability to maintain a clear airway and adequate ventilation will improve Outcome: Progressing   Problem: Role Relationship: Goal: Method of communication will improve Outcome: Progressing   Problem: Education: Goal: Ability to describe self-care  measures that may prevent or decrease complications (Diabetes Survival Skills Education) will improve Outcome: Progressing Goal: Individualized Educational Video(s) Outcome: Progressing   Problem: Coping: Goal: Ability to adjust to condition or change in health will improve Outcome: Progressing   Problem: Fluid Volume: Goal: Ability to maintain a balanced intake and output will improve Outcome: Progressing   Problem: Health Behavior/Discharge Planning: Goal: Ability to identify and utilize available resources and services will improve Outcome: Progressing Goal: Ability to manage health-related needs will improve Outcome: Progressing   Problem: Metabolic: Goal: Ability to maintain appropriate glucose levels will improve Outcome: Progressing   Problem: Nutritional: Goal: Maintenance of adequate nutrition will improve Outcome: Progressing Goal: Progress toward achieving an optimal weight will improve Outcome: Progressing   Problem: Skin Integrity: Goal: Risk for impaired skin integrity will decrease Outcome: Progressing   Problem: Tissue Perfusion: Goal: Adequacy of tissue perfusion will improve Outcome: Progressing

## 2023-06-05 NOTE — Progress Notes (Signed)
 Cardiology Progress Note  Patient ID: Morgan Morgan MRN: [id number] DOB: 09/09/1950 Date of Encounter: 06/05/2023 Primary Cardiologist: None  Subjective   Chief Complaint: None.   HPI: Resting comfortably.  Denies any chest pain or trouble breathing.  ROS:  All other ROS reviewed and negative. Pertinent positives noted in the HPI.     Telemetry  Overnight telemetry shows sinus rhythm 60s with PACs, which I personally reviewed.    Physical Exam   Vitals:   06/05/23 0659 06/05/23 0730 06/05/23 0946 06/05/23 1051  BP:    123/62  Pulse:   64   Resp: 20  (!) 22 20  Temp:    98.5 F (36.9 C)  TempSrc:    Axillary  SpO2:  100%  98%  Weight:      Height:       No intake or output data in the 24 hours ending 06/05/23 1127     06/05/2023    4:14 AM 06/04/2023    3:16 AM 06/03/2023    4:57 AM  Last 3 Weights  Weight (lbs) 219 lb 5.7 oz 199 lb 1.2 oz 198 lb 10.2 oz  Weight (kg) 99.5 kg 90.3 kg 90.1 kg    Body mass index is 41.45 kg/m.  General: Well nourished, well developed, in no acute distress Head: Atraumatic, normal size  Eyes: PEERLA, EOMI  Neck: Supple, no JVD Endocrine: No thryomegaly Cardiac: Normal S1, S2; RRR; no murmurs, rubs, or gallops Lungs: Clear to auscultation bilaterally, no wheezing, rhonchi or rales  Abd: Soft, nontender, no hepatomegaly  Ext: No edema, pulses 2+ Musculoskeletal: No deformities, BUE and BLE strength normal and equal Skin: Warm and dry, no rashes   Neuro: Alert and oriented to person, place, time, and situation, CNII-XII grossly intact, no focal deficits  Psych: Normal mood and affect   Cardiac Studies  TTE 05/26/2023  1. Left ventricular ejection fraction, by estimation, is 20 to 25%. The  left ventricle has severely decreased function. The left ventricle  demonstrates global hypokinesis. There is mild left ventricular  hypertrophy. Left ventricular diastolic parameters   are indeterminate.   2. Right ventricular systolic function  is normal. The right ventricular  size is normal. There is normal pulmonary artery systolic pressure.   3. Left atrial size was severely dilated.   4. The mitral valve is myxomatous. Trivial mitral valve regurgitation. No  evidence of mitral stenosis.   5. The aortic valve is calcified. Aortic valve regurgitation is not  visualized. Aortic valve sclerosis/calcification is present, without any  evidence of aortic stenosis.   6. The inferior vena cava is normal in size with greater than 50%  respiratory variability, suggesting right atrial pressure of 3 mmHg.   Patient Profile  Morgan Morgan is a 73 y.o. female with asthma and hypertension who was admitted on 05/25/2023 with encephalopathy, rhinovirus and concerns for meningeal encephalitis.  Cardiology was consulted due to new onset systolic heart failure.   Assessment & Plan   # Acute systolic heart failure, EF 20 to 25% - Currently admitted to the hospital with encephalopathy and meningitis.  EF was reduced during critical illness.  Evaluated by cardiology in the ICU who did point-of-care ultrasound and felt EF was up to 45%.  Per the recommendation we will repeat a limited echo tomorrow. - No signs of volume overload.  20 mg of p.o. Lasix  as needed. - Continue carvedilol  3.5 mg twice daily, Aldactone  12.5 mg daily.  Add losartan  12.5 mg daily. - Follow-up  results of echo tomorrow.  # Sinus rhythm with PACs versus atrial fibrillation - Evaluated by heart failure team in ICU.  Do not believe she was in A-fib.  Eliquis  has been stopped.  Was placed on amiodarone  to help with PACs. - Continue amiodarone  200 mg twice daily for a total of 7 days followed by 200 mg daily.  Can plan for a 1 month course of therapy to get her through her critical illness. - Hold Eliquis  unless there is definitive evidence of A-fib.  # Encephalopathy # Meningitis - Per primary team      For questions or updates, please contact [facility] Please  consult [email or web address] for contact info under        Signed, Melodee Spruce T. Rolm Clos, MD, [facility]  [facility]  06/05/2023 11:27 AM

## 2023-06-05 NOTE — Progress Notes (Signed)
 A consult was placed to IV Therapy to assess the Left internal jugular TL, that was placed 05-26-23; 2 of the 3 ports are clotted;  white port still has blood return and flushes;  attempted x 2 to place PIV with ultrasound, without success; veins bifurcate in lower arms,and  pt becomes anxious and pulls arm away;  have sent secure chats and spoken with MD;  PICC to be ordered, and placed tomorrow (Monday);

## 2023-06-06 ENCOUNTER — Inpatient Hospital Stay (HOSPITAL_COMMUNITY)

## 2023-06-06 DIAGNOSIS — I5021 Acute systolic (congestive) heart failure: Secondary | ICD-10-CM | POA: Diagnosis not present

## 2023-06-06 DIAGNOSIS — M4646 Discitis, unspecified, lumbar region: Secondary | ICD-10-CM | POA: Diagnosis not present

## 2023-06-06 DIAGNOSIS — G039 Meningitis, unspecified: Secondary | ICD-10-CM | POA: Diagnosis not present

## 2023-06-06 LAB — ECHOCARDIOGRAM LIMITED
Height: 61 in
S' Lateral: 4.8 cm
Weight: 3576.74 [oz_av]

## 2023-06-06 LAB — GLUCOSE, CAPILLARY
Glucose-Capillary: 103 mg/dL — ABNORMAL HIGH (ref 70–99)
Glucose-Capillary: 105 mg/dL — ABNORMAL HIGH (ref 70–99)
Glucose-Capillary: 79 mg/dL (ref 70–99)
Glucose-Capillary: 86 mg/dL (ref 70–99)
Glucose-Capillary: 89 mg/dL (ref 70–99)
Glucose-Capillary: 90 mg/dL (ref 70–99)
Glucose-Capillary: 98 mg/dL (ref 70–99)

## 2023-06-06 LAB — CBC
HCT: 31.1 % — ABNORMAL LOW (ref 36.0–46.0)
Hemoglobin: 10.1 g/dL — ABNORMAL LOW (ref 12.0–15.0)
MCH: 28.1 pg (ref 26.0–34.0)
MCHC: 32.5 g/dL (ref 30.0–36.0)
MCV: 86.6 fL (ref 80.0–100.0)
Platelets: 365 10*3/uL (ref 150–400)
RBC: 3.59 MIL/uL — ABNORMAL LOW (ref 3.87–5.11)
RDW: 16.7 % — ABNORMAL HIGH (ref 11.5–15.5)
WBC: 9 10*3/uL (ref 4.0–10.5)
nRBC: 0.2 % (ref 0.0–0.2)

## 2023-06-06 LAB — BASIC METABOLIC PANEL WITH GFR
Anion gap: 8 (ref 5–15)
BUN: 13 mg/dL (ref 8–23)
CO2: 25 mmol/L (ref 22–32)
Calcium: 8.3 mg/dL — ABNORMAL LOW (ref 8.9–10.3)
Chloride: 106 mmol/L (ref 98–111)
Creatinine, Ser: 0.61 mg/dL (ref 0.44–1.00)
GFR, Estimated: >60 mL/min (ref >60)
Glucose, Bld: 79 mg/dL (ref 70–99)
Potassium: 3.6 mmol/L (ref 3.5–5.1)
Sodium: 139 mmol/L (ref 135–145)

## 2023-06-06 MED ORDER — BENZONATATE 100 MG PO CAPS
100.0000 mg | ORAL_CAPSULE | Freq: Three times a day (TID) | ORAL | Status: DC | PRN
  Administered 2023-06-06: 100 mg via ORAL
  Filled 2023-06-06: qty 1

## 2023-06-06 MED ORDER — ENSURE ENLIVE PO LIQD
237.0000 mL | Freq: Two times a day (BID) | ORAL | Status: DC
  Administered 2023-06-06 – 2023-06-10 (×8): 237 mL via ORAL

## 2023-06-06 MED ORDER — ADULT MULTIVITAMIN W/MINERALS CH
1.0000 | ORAL_TABLET | Freq: Every day | ORAL | Status: DC
  Administered 2023-06-06 – 2023-06-10 (×5): 1 via ORAL
  Filled 2023-06-06 (×5): qty 1

## 2023-06-06 MED ORDER — LIDOCAINE 5 % EX PTCH
1.0000 | MEDICATED_PATCH | CUTANEOUS | Status: DC
  Administered 2023-06-06 – 2023-06-10 (×5): 1 via TRANSDERMAL
  Filled 2023-06-06 (×6): qty 1

## 2023-06-06 NOTE — [place] FL2 (Signed)
 [place]  MEDICAID FL2 LEVEL OF CARE FORM     IDENTIFICATION  Patient Name: Morgan Morgan Birthdate: 07-20-1950 Sex: female Admission Date (Current Location): 05/25/2023  [facility] and [place] Number:  Producer, television/film/video and Address:  [facility], [address], [place], [place] [address]      Provider Number: [id number]  Attending Physician Name and Address:  Sandie Cross, MD  Relative Name and Phone Number:  cobb,dennis Brother [phone]    Current Level of Care: Hospital Recommended Level of Care: Skilled Nursing Facility Prior Approval Number:    Date Approved/Denied:   PASRR Number: [id number]  Discharge Plan: SNF    Current Diagnoses: Patient Active Problem List   Diagnosis Date Noted   Meningitis 06/06/2023   Acute systolic heart failure (HCC) 06/05/2023   PAC (premature atrial contraction) 06/05/2023   Bacterial meningitis 06/01/2023   Diskitis 05/30/2023   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/28/2023   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   On mechanically assisted ventilation (HCC) 05/28/2023   Acute respiratory failure with hypoxia (HCC) 05/28/2023   Rhinovirus 05/26/2023   Septic shock (HCC) 05/26/2023   Altered mental status 05/25/2023   Acute encephalopathy 05/25/2023   Polycystic liver disease 11/19/2021   Asthma exacerbation 07/26/2012   Obesity, unspecified 07/26/2012   Edema 07/26/2012   Essential hypertension, benign 07/26/2012    Orientation RESPIRATION BLADDER Height & Weight     Situation, Time  Normal Incontinent Weight: 223 lb 8.7 oz (101.4 kg) Height:  5\' 1"  (154.9 cm)  BEHAVIORAL SYMPTOMS/MOOD NEUROLOGICAL BOWEL NUTRITION STATUS      Incontinent Diet (See dc summary)  AMBULATORY STATUS COMMUNICATION OF NEEDS Skin   Extensive Assist Verbally PU Stage and Appropriate Care (Wound / Incision (Open or Dehisced) 05/26/23 Other (Comment) Pretibial Left closed serous blister)                        Personal Care Assistance Level of Assistance  Bathing, Feeding, Dressing, Total care Bathing Assistance: Maximum assistance Feeding assistance: Limited assistance Dressing Assistance: Maximum assistance Total Care Assistance: Maximum assistance   Functional Limitations Info  Sight, Hearing, Speech Sight Info: Adequate Hearing Info: Adequate Speech Info: Adequate    SPECIAL CARE FACTORS FREQUENCY  PT (By licensed PT), OT (By licensed OT)     PT Frequency: 5x weekly OT Frequency: 5x weekly            Contractures      Additional Factors Info  Code Status, Allergies Code Status Info: Full Allergies Info: Zithromax (Azithromycin)           Current Medications (06/06/2023):  This is the current hospital active medication list Current Facility-Administered Medications  Medication Dose Route Frequency Provider Last Rate Last Admin   acetaminophen  (TYLENOL ) tablet 650 mg  650 mg Oral Q6H PRN Patel, Amar, DO   650 mg at 06/05/23 2112   Or   acetaminophen  (TYLENOL ) suppository 650 mg  650 mg Rectal Q6H PRN Jonelle Neri, DO       amiodarone  (PACERONE ) tablet 200 mg  200 mg Oral BID Harrold Lincoln, MD   200 mg at 06/06/23 0804   Followed by   Cecily Cohen ON 06/12/2023] amiodarone  (PACERONE ) tablet 200 mg  200 mg Oral Daily O'Neal, Cathay Clonts, MD       arformoterol  (BROVANA ) nebulizer solution 15 mcg  15 mcg Nebulization BID Joesph Mussel, DO   15 mcg at 06/06/23 0820   budesonide  (  PULMICORT ) nebulizer solution 0.25 mg  0.25 mg Nebulization BID Joesph Mussel, DO   0.25 mg at 06/06/23 1610   carvedilol  (COREG ) tablet 3.125 mg  3.125 mg Oral BID WC Harrold Lincoln, MD   3.125 mg at 06/06/23 0804   cefTRIAXone  (ROCEPHIN ) 2 g in sodium chloride  0.9 % 100 mL IVPB  2 g Intravenous Q12H Ernie Heal, Jerelyn Money, MD 200 mL/hr at 06/06/23 0803 2 g at 06/06/23 0803   [START ON 06/08/2023] cefTRIAXone  (ROCEPHIN ) 2 g in sodium chloride  0.9 % 100 mL IVPB  2 g Intravenous Q24H Ernie Heal,  Jerelyn Money, MD       Chlorhexidine  Gluconate Cloth 2 % PADS 6 each  6 each Topical Daily Bevelyn Bryant, MD   6 each at 06/06/23 0804   [START ON 06/08/2023] DAPTOmycin  (CUBICIN ) IVPB 500 mg/24mL premix  8 mg/kg (Adjusted) Intravenous Q1400 Ernie Heal, Jerelyn Money, MD       feeding supplement (ENSURE ENLIVE / ENSURE PLUS) liquid 237 mL  237 mL Oral BID BM Sandie Cross, MD       guaiFENesin  (ROBITUSSIN) 100 MG/5ML liquid 5 mL  5 mL Oral Q4H PRN Ether Hercules, MD   5 mL at 06/05/23 2116   insulin  aspart (novoLOG ) injection 0-9 Units  0-9 Units Subcutaneous Q4H Joesph Mussel, DO   3 Units at 06/05/23 2115   lidocaine  (LIDODERM ) 5 % 1 patch  1 patch Transdermal Q24H Koomson, Julius, MD   1 patch at 06/06/23 9604   losartan  (COZAAR ) tablet 12.5 mg  12.5 mg Oral Daily Harrold Lincoln, MD   12.5 mg at 06/06/23 5409   multivitamin with minerals tablet 1 tablet  1 tablet Oral Daily Sandie Cross, MD       Oral care mouth rinse  15 mL Mouth Rinse 4 times per day Josiah Nigh, MD   15 mL at 06/05/23 2154   Oral care mouth rinse  15 mL Mouth Rinse PRN Josiah Nigh, MD       polyethylene glycol (MIRALAX  / GLYCOLAX ) packet 17 g  17 g Oral Daily PRN Ether Hercules, MD       sodium chloride  flush (NS) 0.9 % injection 10-40 mL  10-40 mL Intracatheter PRN Ether Hercules, MD       spironolactone  (ALDACTONE ) tablet 12.5 mg  12.5 mg Oral Daily Lee, [place], NP   12.5 mg at 06/06/23 0804   thiamine  (VITAMIN B1) tablet 100 mg  100 mg Oral Daily Ether Hercules, MD   100 mg at 06/06/23 0804   vancomycin  (VANCOREADY) IVPB 750 mg/150 mL  750 mg Intravenous Q12H Ernie Heal, Jerelyn Money, MD 150 mL/hr at 06/06/23 0949 750 mg at 06/06/23 8119     Discharge Medications: Please see discharge summary for a list of discharge medications.  Relevant Imaging Results:  Relevant Lab Results:   Additional Information SSN: [id number]  Murphy Arn, LCSWA

## 2023-06-06 NOTE — Progress Notes (Signed)
   06/06/23 1116  What Happened  Was fall witnessed? No  Was patient injured? Unsure  Patient found on floor  Found by Staff-comment Danley Dusky NT)  Stated prior activity bathroom-unassisted  Provider Notification  Provider Name/Title Dann Dust Nooruddin  Date Provider Notified 06/06/23  Time Provider Notified 1123  Method of Notification Page  Notification Reason Fall  Provider response Evaluate remotely;See new orders  Date of Provider Response 06/06/23  Time of Provider Response 1128  Follow Up  Family notified Yes - comment Aletta Andreas)  Time family notified 1359  Progress note created (see row info) Yes  Adult Fall Risk Assessment  Risk Factor Category (scoring not indicated) High fall risk per protocol (document High fall risk)  Patient Fall Risk Level High fall risk  Adult Fall Risk Interventions  Required Bundle Interventions *See Row Information* High fall risk - low, moderate, and high requirements implemented  Additional Interventions Reorient/diversional activities with confused patients;Use of appropriate toileting equipment (bedpan, BSC, etc.)  Screening for Fall Injury Risk (To be completed on HIGH fall risk patients) - Assessing Need for Floor Mats  Risk For Fall Injury- Criteria for Floor Mats Confusion/dementia (+NuDESC, CIWA, TBI, etc.)  Will Implement Floor Mats Yes  Oxygen Therapy  O2 Device Room Air  Pain Assessment  Pain Scale 0-10  Pain Score 0  PCA/Epidural/Spinal Assessment  Respiratory Pattern Regular  Neurological  Neuro (WDL) X  Level of Consciousness Alert  Orientation Level Oriented to situation;Oriented to time  Cognition Poor safety awareness;Poor judgement;Poor attention/concentration;Impulsive  Speech Clear  R Pupil Size (mm) 2  R Pupil Shape Round  R Pupil Reaction Brisk  L Pupil Size (mm) 2  L Pupil Shape Round  L Pupil Reaction Brisk  Motor Function/Sensation Assessment Motor response  R Hand Grip Moderate  L Hand Grip Moderate  R  Foot Dorsiflexion Weak  L Foot Dorsiflexion Weak  R Foot Plantar Flexion Weak  L Foot Plantar Flexion Weak  RUE Motor Response Purposeful movement  RUE Motor Strength 4  LUE Motor Response Purposeful movement  LUE Motor Strength 4  RLE Motor Response Purposeful movement  RLE Motor Strength 2  LLE Motor Response Purposeful movement  LLE Motor Strength 2  Neuro Symptoms None  Glasgow Coma Scale  Eye Opening 4  Best Verbal Response (NON-intubated) 4  Best Motor Response 6  Glasgow Coma Scale Score 14  Musculoskeletal  Musculoskeletal (WDL) X  Assistive Device Stedy  Generalized Weakness Yes  Integumentary  Integumentary (WDL) X  Skin Color Appropriate for ethnicity  Skin Condition Dry  Skin Integrity Blister  Description of Blister Serous  Blister Location Leg  Blister Location Orientation Left  Blister Intervention Gauze  Erythema/Redness Location Groin  Pain Screening  Clinical Progression Not changed  Patient had a ground level fall from bed to floor. Patient landed on bottom, denies any pain, denies hitting her head. MD notified, xray ordered. Patient placed back in bed, bed alarm placed, fall mats placed, side rails up, call light in reach. Family notified, no questions at this time. Will continue to monitor patient.

## 2023-06-06 NOTE — TOC Progression Note (Addendum)
 Transition of Care [facility]) - Progression Note    Patient Details  Name: Elany Felix MRN: [id number] Date of Birth: 1950/04/26  Transition of Care [facility]) CM/SW Contact  Ernst Heap Phone Number: [phone] 06/06/2023, 2:39 PM  Clinical Narrative:   12:21 AM- HF CSW called and spoke with the patients sister Verlyn Goad to discuss SNF process. Elosie stated to contact her brother Cornel Diesel.   HF CSW met with the patient at bedside. CSW explained that SNF process. Patient is agreeable to being faxed out. Will present bed offers once some facilities accept.   FL2 completed and faxed out.   3:11 PM- HF CSW called the patients brother, Cornel Diesel to discuss SNF recommendation by PT. Agreeable to be faxed out also. Offers pending.   TOC will continue following.     Expected Discharge Plan: Skilled Nursing Facility Barriers to Discharge: Continued Medical Work up  Expected Discharge Plan and Services   Discharge Planning Services: CM Consult   Living arrangements for the past 2 months: Single Family Home                                       Social Determinants of Health (SDOH) Interventions SDOH Screenings   Food Insecurity: Patient Unable To Answer (05/26/2023)  Housing: Patient Unable To Answer (05/26/2023)  Transportation Needs: Patient Unable To Answer (05/26/2023)  Utilities: Patient Unable To Answer (05/26/2023)  Social Connections: Patient Unable To Answer (05/26/2023)  Tobacco Use: Unknown (05/25/2023)    Readmission Risk Interventions     No data to display

## 2023-06-06 NOTE — Progress Notes (Signed)
 Speech Language Pathology Treatment: Dysphagia  Patient Details Name: Morgan Morgan MRN: [id number] DOB: 1950-11-03 Today's Date: 06/06/2023 Time: 6962-9528 SLP Time Calculation (min) (ACUTE ONLY): 8 min  Assessment / Plan / Recommendation Clinical Impression  Pt has made significant improvements in oropharyngeal swallow. Vocal quality clear with adequate intensity and denies odonophagia. She was started on clear liquids over the weekend. There was no coughing noted with multiple straw sips thin throughout session and pt denied coughing with po's. She had subtle throat clear that were not concerning for decreased airway protection. Mastication of regular solid was functional without residue. Pt requesting chicken sandwich and given menu- also on room service with assist. Recommend regular texture, thin liquids, pills with liquids. NO further ST needed at this time.    HPI HPI: 73 year old woman presented via EMS on 4/9 as a code stroke after being found down by her grandson.  Intubated 4/9-4/17.  Head CT and CT angio was negative as well as UDS. Developed fever, MS with no improvement. Palliative care consulted.  Pt with improved MS 4/17; extubated, able to follow commands intermittently.      SLP Plan  All goals met;Discharge SLP treatment due to (comment)      Recommendations for follow up therapy are one component of a multi-disciplinary discharge planning process, led by the attending physician.  Recommendations may be updated based on patient status, additional functional criteria and insurance authorization.    Recommendations  Diet recommendations: Regular;Thin liquid Liquids provided via: Cup Medication Administration: Whole meds with liquid Supervision: Patient able to self feed Compensations: Slow rate;Small sips/bites Postural Changes and/or Swallow Maneuvers: Seated upright 90 degrees                  Oral care BID     Dysphagia, unspecified (R13.10)     All goals  met;Discharge SLP treatment due to (comment)     Morgan Morgan  06/06/2023, 8:47 AM

## 2023-06-06 NOTE — Progress Notes (Signed)
 Nutrition Follow-up  DOCUMENTATION CODES:   Not applicable  INTERVENTION:   Ensure Enlive po BID, each supplement provides 350 kcal and 20 grams of protein. MVI with minerals daily.  NUTRITION DIAGNOSIS:   Inadequate oral intake related to inability to eat as evidenced by NPO status.  Ongoing, diet advanced to regular today.  GOAL:   Patient will meet greater than or equal to 90% of their needs  Progressing   MONITOR:   Vent status, TF tolerance  REASON FOR ASSESSMENT:   Consult Enteral/tube feeding initiation and management  ASSESSMENT:   73 yo female admitted with AMS, acute encephalopathy after being found down at home. Patient decompensated 4/10 early AM and required transfer to the ICU and intubated later in the day. PMH includes asthma, HTN.  Patient with improvement in mental status since last RD note.  Diet was advanced to clear liquids 4/19, and to regular this morning. Cortrak tube has been removed.  She reports that she has a good appetite. She was able to order a chicken sandwich and mashed potatoes for lunch, but only ate about half of it. She usually does not drink PO supplements, but agreed to try Ensure since her intake was not back to normal yet.   Labs reviewed. Medications reviewed and include spironolactone , thiamine , IV antibiotics.  Admit weight 99.4 kg (4/9) Current weight 101.4 kg   Diet Order:   Diet Order             Diet regular Room service appropriate? Yes with Assist; Fluid consistency: Thin  Diet effective now                   EDUCATION NEEDS:   No education needs have been identified at this time  Skin:  Skin Assessment: Reviewed RN Assessment (L pretibial blister)  Last BM:  4/21 type 7  Height:   Ht Readings from Last 1 Encounters:  05/26/23 5\' 1"  (1.549 m)    Weight:   Wt Readings from Last 1 Encounters:  06/06/23 101.4 kg    BMI:  Body mass index is 42.24 kg/m.  Estimated Nutritional Needs:    Kcal:  1600-1800  Protein:  95-115 gm  Fluid:  1.6-1.8 L   Barnet Boots RD, LDN, CNSC Contact via secure chat. If unavailable, use group chat "RD Inpatient."

## 2023-06-06 NOTE — Progress Notes (Signed)
 HD#12 SUBJECTIVE:  Patient Summary: Morgan Morgan is a 73 y.o. with a pertinent PMH of asthma and hypertension who presented with AMS and was admitted for acute encephalopathy secondary to suspected bacterial meningitis and lumbar discitis.   Overnight Events: NAOE  Interim History: Patient was evaluated at bedside. She was resting comfortably in bed.  She states that she is feeling better and her cough has improved with the nebulizer treatments. She has been able to eat jello without any dysphagia, nausea, or vomiting. She is very hungry, requesting a chicken sandwich. She endorses some chest pain that began 2 days ago and can be reproduced with palpation of sternum. Denies any fevers/chills/sweats, SOB, or palpitations. She complains of a headache she has had for a few days now.   OBJECTIVE:  Vital Signs: Vitals:   06/05/23 2144 06/06/23 0358 06/06/23 0819 06/06/23 0820  BP:  (!) 123/56    Pulse: 63 (!) 58    Resp: (!) 22 18    Temp:  97.8 F (36.6 C)    TempSrc:  Oral    SpO2: 94% 96% 99% 99%  Weight:  101.4 kg    Height:       Supplemental O2: Nasal Cannula SpO2: 99 % O2 Flow Rate (L/min): 0 L/min FiO2 (%): 21 %  Filed Weights   06/04/23 0316 06/05/23 0414 06/06/23 0358  Weight: 90.3 kg 99.5 kg 101.4 kg     Intake/Output Summary (Last 24 hours) at 06/06/2023 0929 Last data filed at 06/06/2023 0358 Gross per 24 hour  Intake 720 ml  Output 600 ml  Net 120 ml    Net IO Since Admission: 4,290.39 mL [06/06/23 0929]  Physical Exam Constitutional:      Appearance: Normal appearance.  Cardiovascular:     Rate and Rhythm: Normal rate and regular rhythm.     Heart sounds: No murmur heard.    No friction rub. No gallop.  Pulmonary:     Effort: Pulmonary effort is normal.     Breath sounds: Normal breath sounds.  Abdominal:     General: Abdomen is flat. Bowel sounds are normal.     Palpations: Abdomen is soft.  Musculoskeletal:     Cervical back: No rigidity.   Feet:     Comments: Feet tender to the touch bilaterally Skin:    General: Skin is warm and dry.  Neurological:     General: No focal deficit present.     Mental Status: She is alert and oriented to person, place, and time.     CBC     Component Value Date/Time   WBC 9.0 06/06/2023 0608   RBC 3.59 (L) 06/06/2023 0608   HGB 10.1 (L) 06/06/2023 0608   HCT 31.1 (L) 06/06/2023 0608   PLT 365 06/06/2023 0608   MCV 86.6 06/06/2023 0608   MCH 28.1 06/06/2023 0608   MCHC 32.5 06/06/2023 0608   RDW 16.7 (H) 06/06/2023 0608   LYMPHSABS 2.5 06/05/2023 1330   MONOABS 0.8 06/05/2023 1330   EOSABS 0.3 06/05/2023 1330   BASOSABS 0.3 (H) 06/05/2023 1330   CMP    Component Value Date/Time   NA 139 06/06/2023 0608   K 3.6 06/06/2023 0608   CL 106 06/06/2023 0608   CO2 25 06/06/2023 0608   GLUCOSE 79 06/06/2023 0608   BUN 13 06/06/2023 0608   CREATININE 0.61 06/06/2023 0608   CALCIUM  8.3 (L) 06/06/2023 0608   PROT 5.7 (L) 05/27/2023 0409   ALBUMIN 2.0 (L) 05/27/2023 0409  AST 30 05/27/2023 0409   ALT 15 05/27/2023 0409   ALKPHOS 70 05/27/2023 0409   BILITOT 0.5 05/27/2023 0409   GFRNONAA >60 06/06/2023 0608   ASSESSMENT/PLAN:  Assessment: Principal Problem:   Lumbar discitis Active Problems:   Altered mental status   Acute encephalopathy   Rhinovirus   Septic shock (HCC)   Osteomyelitis of lumbar spine (HCC)   On mechanically assisted ventilation (HCC)   Acute respiratory failure with hypoxia (HCC)   Diskitis   Bacterial meningitis   Acute systolic heart failure (HCC)   PAC (premature atrial contraction)  Morgan Morgan is a 73 y.o. female with asthma and hypertension who presented on 05/25/2023 with encephalopathy and was admitted with severe bacterial meningitis and lumbar osteomyelitis now complicated by non-ischemic cardiomyopathy on hospital day 12.   Plan: #Meningitis with discitis #Acute encephalopathy, improving Continues to follow with ID. Remains afebrile and  hemodynamically stable. Leukocytosis resolved at 9.8 this morning. Mental status continues to improve. No changes to her antibiotic course and will pursue 6-week treatment with 2 weeks of Trexan 2 g IV and vancomycin  for CNS penetration. This will be followed by 4 weeks of CTX 2 g daily and daptomycin  500 mg IV daily. If we lose IJ access or the ports become clogged again we can consider placing PICC line to continue antibiotic treatment, otherwise we will plan to place the PICC line a day or two before she discharges to a SNF. - Follow-up ID recs - Continue IV vancomycin  750 mg every 12 hours and her Rocephin  2 g every 12 hours through 4/22 followed by IV Dapto 500 mg every 24 hours and Rocephin  2 g every 24 hours through 5/20   #Acute systolic heart failure HF likely in the setting of acute encephalopathy, EF of 20-25% on 4/10. Bedside POCUS on 4/18 showed EF 40-45%. Cardiology is following, added Coreg  3.125 mg twice daily on 4/19. She remains euvolemic with no signs of volume overload on exam. We will repeat echo today. If her EF remains low we will consider cardiac MRI. - Follow-up cardiology recs - Continue Spiro 12.5 mg daily and Coreg  3.125 mg twice daily - Follow-up Echo results today  #Atrial fibrillation versus frequent PACs Concern for Afib when patient was in ICU. Eliquis  was stopped on 4/18 as cardiology determined patient was having frequent PACs instead of Afib. Amiodarone  was started on 4/19 to help suppress PACs. At this time cardiology plans to continue amiodarone  BID and transition to once daily on 4/27. Coreg  was also started on 4/19 to address both HF and PACs. Repeat EKG today to follow up on PACs on telemetry. - Repeat EKG today - Follow-up cardiology recs - Continue amiodarone  200 mg BID - Continue carvedilol  3.125 mg BID   #Hypoglycemia, resolved #Poor p.o. intake, improving She has been tolerating clear liquids well and had no hypoglycemic episodes overnight. SLP saw  patient this morning and cleared patient for a regular diet, no longer needs to be followed by speech therapy.  #Acute Hypoxic Respiratory Failure 2/2 encephalopathy, resolved Patient is saturating in high 90s on room air with no increased work of breathing, feels she is coughing less with the nebulizer treatments. -Continue on Brovana  and Pulmicort   Best Practice: Diet: Regular diet IVF: None VTE: SCDs Start: 05/25/23 1528 Code: Full AB: Vanc, rocephin , dapto  Therapy Recs: SNF, re-consulted PT/OT DISPO: Anticipated discharge  TBD  to Skilled nursing facility pending  IV antibiotics and mental status . Reached out to HF social  worker today to begin process of finding SNF.  Morgan Morgan, [facility] MS4   Please contact the on call pager after 5 pm and on weekends at [phone].

## 2023-06-06 NOTE — Progress Notes (Signed)
 Received new order for [facility]. She is already on OT caseload and I slated to be seen this week.  Merryl Abraham OT Acute Rehabilitation Services Office [phone]

## 2023-06-06 NOTE — Progress Notes (Signed)
 Mobility Specialist Progress Note;   06/06/23 1423  Mobility  Activity Transferred to/from [facility]  Level of Assistance Moderate assist, patient does 50-74%  Assistive Device  (HHA)  Mobility visit 1 Mobility  Mobility Specialist Start Time (ACUTE ONLY) 1432  Mobility Specialist Stop Time (ACUTE ONLY) 1438  Mobility Specialist Time Calculation (min) (ACUTE ONLY) 6 min   Assisted NT in transferring pt to [facility]. Required ModA +2 for bed mobility and to stand from EOB. Bed soiled upon standing. VC required for safety. Pt left on BSC w/ NT in room.   Janit Meline Mobility Specialist Please contact via SecureChat or [employer] [phone]

## 2023-06-06 NOTE — Progress Notes (Signed)
   Patient Name: Morgan Morgan Date of Encounter: 06/06/2023 [facility] Cardiologist: Oneil Bigness, MD   Interval Summary  .    Patient reports feeling well this AM. Denies chest pain, shortness of breath, lower extremity swelling. Denies palpitations   Vital Signs .    Vitals:   06/05/23 2144 06/06/23 0358 06/06/23 0819 06/06/23 0820  BP:  (!) 123/56    Pulse: 63 (!) 58    Resp: (!) 22 18    Temp:  97.8 F (36.6 C)    TempSrc:  Oral    SpO2: 94% 96% 99% 99%  Weight:  101.4 kg    Height:        Intake/Output Summary (Last 24 hours) at 06/06/2023 0857 Last data filed at 06/06/2023 0358 Gross per 24 hour  Intake 720 ml  Output 600 ml  Net 120 ml      06/06/2023    3:58 AM 06/05/2023    4:14 AM 06/04/2023    3:16 AM  Last 3 Weights  Weight (lbs) 223 lb 8.7 oz 219 lb 5.7 oz 199 lb 1.2 oz  Weight (kg) 101.4 kg 99.5 kg 90.3 kg      Telemetry/ECG    NSR with frequent PACs - Personally Reviewed  Physical Exam .   GEN: No acute distress.  Laying flat in the bed  Neck: No JVD Cardiac:  RRR, no murmurs, rubs, or gallops. Radial pulses 2+ bilaterally  Respiratory: Clear to auscultation bilaterally. Normal Wob on room air  GI: Soft, nontender, non-distended  MS: No edema in BLE   Assessment & Plan .     Acute systolic heart failure  - Patient currently admitted with encephalopathy, meningitis. Echocardiogram 4/10 (in the setting of acute illness) showed EF 20-25% with global hypokinesis, normal RV systolic function, normal PA systolic pressure - While in the ICU, patient was evaluated by advanced heart failure. Bedside POCUS showed EF 40-45% on 4/18  - Limited echocardiogram pending today  - She is euvolemic on exam today, denies SOB, orthopnea, ankle edema. Plan to discharge with lasix  20 mg PRN  - Continue carvedilol  3.125 mg BID, losartan  12.5 mg daily, spironolactone  12.5 mg daily  - Patient has had episodes of hypoglycemia this admission in the setting of  poor oral intake. If this stabilizes, consider adding SGLT2i as an outpatient   Sinus rhythm with PACs  - While in the ICU, there was some concern for Afib. When seen by heart failure, they felt it was sinus rhythm with frequent PACs. Eliquis  was stopped  - She is now on amiodarone  to help suppress PACs  - Continue amiodarone  200 mg BID. Transition to 200 mg daily on 4/27  - Continue carvedilol  3.125 mg BID  - Limited echo pending as above   Otherwise per primary  - Meningitis with discitis  - Acute encephalopathy  - Hypoglycemia  - Acute hypoxic respiratory failure   For questions or updates, please contact [facility] Please consult [email or web address] for contact info under        Signed, Debria Fang, PA-C

## 2023-06-07 ENCOUNTER — Other Ambulatory Visit: Payer: Self-pay

## 2023-06-07 DIAGNOSIS — G039 Meningitis, unspecified: Secondary | ICD-10-CM | POA: Diagnosis not present

## 2023-06-07 DIAGNOSIS — I5021 Acute systolic (congestive) heart failure: Secondary | ICD-10-CM | POA: Diagnosis not present

## 2023-06-07 DIAGNOSIS — M4646 Discitis, unspecified, lumbar region: Secondary | ICD-10-CM | POA: Diagnosis not present

## 2023-06-07 LAB — BASIC METABOLIC PANEL WITH GFR
Anion gap: 9 (ref 5–15)
BUN: 12 mg/dL (ref 8–23)
CO2: 24 mmol/L (ref 22–32)
Calcium: 8.2 mg/dL — ABNORMAL LOW (ref 8.9–10.3)
Chloride: 105 mmol/L (ref 98–111)
Creatinine, Ser: 0.74 mg/dL (ref 0.44–1.00)
GFR, Estimated: >60 mL/min (ref >60)
Glucose, Bld: 85 mg/dL (ref 70–99)
Potassium: 3.4 mmol/L — ABNORMAL LOW (ref 3.5–5.1)
Sodium: 138 mmol/L (ref 135–145)

## 2023-06-07 LAB — GLUCOSE, CAPILLARY
Glucose-Capillary: 84 mg/dL (ref 70–99)
Glucose-Capillary: 93 mg/dL (ref 70–99)
Glucose-Capillary: 83 mg/dL (ref 70–99)
Glucose-Capillary: 81 mg/dL (ref 70–99)
Glucose-Capillary: 93 mg/dL (ref 70–99)

## 2023-06-07 MED ORDER — POTASSIUM CHLORIDE 20 MEQ PO PACK
40.0000 meq | PACK | Freq: Once | ORAL | Status: AC
  Administered 2023-06-07: 40 meq via ORAL
  Filled 2023-06-07: qty 2

## 2023-06-07 MED ORDER — SODIUM CHLORIDE 0.9% FLUSH
10.0000 mL | Freq: Two times a day (BID) | INTRAVENOUS | Status: DC
  Administered 2023-06-08 – 2023-06-10 (×4): 10 mL

## 2023-06-07 MED ORDER — SODIUM CHLORIDE 0.9% FLUSH: 10.0000 mL | INTRAVENOUS | Status: DC | PRN

## 2023-06-07 NOTE — Progress Notes (Signed)
 Peripherally Inserted Central Catheter Placement  The IV Nurse has discussed with the patient and/or persons authorized to consent for the patient, the purpose of this procedure and the potential benefits and risks involved with this procedure.  The benefits include less needle sticks, lab draws from the catheter, and the patient may be discharged home with the catheter. Risks include, but not limited to, infection, bleeding, blood clot (thrombus formation), and puncture of an artery; nerve damage and irregular heartbeat and possibility to perform a PICC exchange if needed/ordered by physician.  Alternatives to this procedure were also discussed.  Bard Power PICC patient education guide, fact sheet on infection prevention and patient information card has been provided to patient /or left at bedside.    PICC Placement Documentation  PICC Single Lumen 06/07/23 Right Basilic 40 cm 0 cm (Active)  Indication for Insertion or Continuance of Line Prolonged intravenous therapies 06/07/23 0848  Exposed Catheter (cm) 0 cm 06/07/23 0848  Site Assessment Clean, Dry, Intact 06/07/23 0848  Line Status Flushed;Saline locked;Blood return noted 06/07/23 0848  Dressing Type Transparent;Securing device 06/07/23 0848  Dressing Status Antimicrobial disc/dressing in place;Clean, Dry, Intact 06/07/23 0848  Line Care Connections checked and tightened 06/07/23 0848  Line Adjustment (NICU/IV Team Only) No 06/07/23 0848  Dressing Intervention New dressing;Adhesive placed at insertion site (IV team only) 06/07/23 0848  Dressing Change Due 06/14/23 06/07/23 0848    Telephone consent signed by Sheilah Denver Albarece 06/07/2023, 8:50 AM

## 2023-06-07 NOTE — Progress Notes (Signed)
   Patient Name: Morgan Morgan Date of Encounter: 06/07/2023 [facility] Cardiologist: Oneil Bigness, MD    Interval Summary  .    Patient reports feeling well this AM. No chest pain, shortness of breath, palpitations, lower extremity swelling   Vital Signs .    Vitals:   06/06/23 2136 06/06/23 2315 06/07/23 0552 06/07/23 0754  BP: (!) 108/50 121/68 (!) 115/54   Pulse: 68 70 69 65  Resp: 19 17 18 16   Temp:  97.6 F (36.4 C) 98 F (36.7 C)   TempSrc:   Oral   SpO2:  95% 93% 98%  Weight:      Height:        Intake/Output Summary (Last 24 hours) at 06/07/2023 0907 Last data filed at 06/07/2023 0552 Gross per 24 hour  Intake 960 ml  Output 500 ml  Net 460 ml      06/06/2023    3:58 AM 06/05/2023    4:14 AM 06/04/2023    3:16 AM  Last 3 Weights  Weight (lbs) 223 lb 8.7 oz 219 lb 5.7 oz 199 lb 1.2 oz  Weight (kg) 101.4 kg 99.5 kg 90.3 kg      Telemetry/ECG    NSR with occasional PACs - Personally Reviewed  Physical Exam .   GEN: No acute distress. Sitting upright in the bed    Neck: No JVD Cardiac:  RRR, no murmurs, rubs, or gallops. Radial pulses 2+ bilaterally  Respiratory: Clear to auscultation bilaterally. Normal WOB on room air  GI: Soft, nontender, non-distended  MS: No edema in BLE   Assessment & Plan .     Acute systolic heart failure  - Patient currently admitted with encephalopathy, meningitis. Echocardiogram 4/10 (in the setting of acute illness) showed EF 20-25% with global hypokinesis, normal RV systolic function, normal PA systolic pressure - Limited echocardiogram yesterday showed EF 40-45%, normal RV systolic function  - She is euvolemic on exam today, denies SOB, orthopnea, ankle edema. Plan to discharge with lasix  20 mg PRN  - Continue carvedilol  3.125 mg BID, losartan  12.5 mg daily, spironolactone  12.5 mg daily. BP currently does not allow titration of GDMT  - Patient has had episodes of hypoglycemia this admission in the setting of  poor oral intake. If this stabilizes, consider adding SGLT2i as an outpatient    Sinus rhythm with PACs  - While in the ICU, there was some concern for Afib. When seen by heart failure, they felt it was sinus rhythm with frequent PACs. Eliquis  was stopped  - She is now on amiodarone  to help suppress PAC. Per telemetry, PAC burden has decreased  - Continue amiodarone  200 mg BID. Transition to 200 mg daily on 4/27  - Continue carvedilol  3.125 mg BID  - K 3.4 this AM. Supplementation ordered by primary    Otherwise per primary  - Meningitis with discitis  - Acute encephalopathy  - Hypoglycemia  - Acute hypoxic respiratory failure   For questions or updates, please contact [facility] Please consult [email or web address] for contact info under        Signed, Debria Fang, PA-C

## 2023-06-07 NOTE — Plan of Care (Signed)
   Problem: Coping: Goal: Level of anxiety will decrease Outcome: Progressing   Problem: Pain Managment: Goal: General experience of comfort will improve and/or be controlled Outcome: Progressing

## 2023-06-07 NOTE — Progress Notes (Signed)
 Physical Therapy Treatment Patient Details Name: Morgan Morgan MRN: [id number] DOB: [birth year] Today's Date: 06/07/2023   History of Present Illness 73 y.o. female presents to [facility] 05/25/23 after falling at home with AMS. Code stroke activated with no remarkable findings. 4/10 transfer to ICU. 4/10-4/17 intubated. Pt with meningoencephalitis, positive rhinovirus/enterovirus, L2-L3 osteomyelitis/discitis. PMHx: asthma, HTN    PT Comments  Progressing this date and able to tolerate standing twice and getting some stepping in though limited with R LE weakness when stepping with L.  Patient eager for up and moving though noted pt fell yesterday so limited to in bed up to chair position end of session for safety.  Patient remains confused and high fall risk.  PT will continue to follow.     If plan is discharge home, recommend the following: Assistance with cooking/housework;Assist for transportation;Help with stairs or ramp for entrance;Two people to help with walking and/or transfers;Two people to help with bathing/dressing/bathroom   Can travel by private vehicle     No  Equipment Recommendations  Rolling walker (2 wheels);BSC/3in1;Wheelchair (measurements PT);Wheelchair cushion (measurements PT);Hospital bed;Hoyer lift    Recommendations for Other Services       Precautions / Restrictions Precautions Precautions: Fall Recall of Precautions/Restrictions: Impaired     Mobility  Bed Mobility Overal bed mobility: Needs Assistance Bed Mobility: Supine to Sit, Sit to Supine     Supine to sit: +2 for safety/equipment, HOB elevated, Used rails, Min assist Sit to supine: Mod assist, +2 for safety/equipment   General bed mobility comments: increased time though able to move legs off EOB and pull up on rails to sit, assist mainly with scooting forward; to supine assist for legs and positioning    Transfers Overall transfer level: Needs assistance Equipment used: Rolling walker (2  wheels) Transfers: Sit to/from Stand Sit to Stand: From elevated surface, Mod assist, +2 physical assistance, Max assist           General transfer comment: lifting help to stand to RW x 2 reps and cues for hand placement    Ambulation/Gait Ambulation/Gait assistance: Mod assist, +2 physical assistance Gait Distance (Feet): 1 Feet Assistive device: Rolling walker (2 wheels) Gait Pattern/deviations: Step-to pattern, Trunk flexed, Decreased stride length       General Gait Details: Initially performed lateral weight shifts, then with second stand performed side stepping to HOB x ~3 reps with difficulty moving L foot without R knee buckling; cues for tall posture, weight on hands when stepping with L.   Stairs             Wheelchair Mobility     Tilt Bed    Modified Rankin (Stroke Patients Only)       Balance Overall balance assessment: Needs assistance Sitting-balance support: Feet supported Sitting balance-Leahy Scale: Fair       Standing balance-Leahy Scale: Poor Standing balance comment: UE support and min A for balance static                            Communication Communication Communication: No apparent difficulties  Cognition Arousal: Alert Behavior During Therapy: Impulsive, Restless   PT - Cognitive impairments: No family/caregiver present to determine baseline, Orientation, Memory, Awareness, Sequencing, Problem solving, Safety/Judgement                       PT - Cognition Comments: decreased memory asking for phone while taking seated BP Due to c/o dizziness  and educated to wait till after PT then asked again and finally end of session grabbing phone as getting situated in bed. Following commands: Impaired Following commands impaired: Follows one step commands with increased time, Only follows one step commands consistently    Cueing    Exercises General Exercises - Lower Extremity Ankle Circles/Pumps: AROM, Both, 10  reps, Supine Heel Slides: AROM, Both, 5 reps, Supine    General Comments General comments (skin integrity, edema, etc.): VSS with mobility, BP checks WNL throughout and SpO2 90's to 100 on RA      Pertinent Vitals/Pain Pain Assessment Pain Assessment: Faces Faces Pain Scale: No hurt    Home Living                          Prior Function            PT Goals (current goals can now be found in the care plan section) Progress towards PT goals: Progressing toward goals    Frequency    Min 2X/week      PT Plan      Co-evaluation              AM-PAC PT "6 Clicks" Mobility   Outcome Measure  Help needed turning from your back to your side while in a flat bed without using bedrails?: A Lot Help needed moving from lying on your back to sitting on the side of a flat bed without using bedrails?: A Lot Help needed moving to and from a bed to a chair (including a wheelchair)?: Total Help needed standing up from a chair using your arms (e.g., wheelchair or bedside chair)?: Total Help needed to walk in hospital room?: Total Help needed climbing 3-5 steps with a railing? : Total 6 Click Score: 8    End of Session Equipment Utilized During Treatment: Gait belt Activity Tolerance: Patient tolerated treatment well Patient left: in bed;with call bell/phone within reach;with bed alarm set;with nursing/sitter in room   PT Visit Diagnosis: Other abnormalities of gait and mobility (R26.89);Muscle weakness (generalized) (M62.81);History of falling (Z91.81)     Time: 7846-9629 PT Time Calculation (min) (ACUTE ONLY): 28 min  Charges:    $Therapeutic Exercise: 8-22 mins $Therapeutic Activity: 8-22 mins PT General Charges $$ ACUTE PT VISIT: 1 Visit                     Abigail Hoff, PT Acute Rehabilitation Services Office:[phone] 06/07/2023    Morgan Morgan 06/07/2023, 11:51 AM

## 2023-06-07 NOTE — Progress Notes (Signed)
 HD#13 SUBJECTIVE:  Patient Summary: Morgan Morgan is a 73 y.o. with a pertinent PMH of asthma and hypertension who presented with AMS and was admitted for acute encephalopathy secondary to suspected bacterial meningitis and lumbar discitis.   Overnight Events: NAOE  Interim History: Patient was evaluated at bedside, felt dizzy when we sat her up to listen to her lungs. She fell on her bottom yesterday trying to get up from her bed to use the restroom. She said she felt dizzy/lightheaded before she fell, she did not hit her head and does not have any soreness today. She states that she is feeling better and her cough has improved with the nebulizer treatments and tessalon  pearls. She ate a little bit of lunch and dinner yesterday without n/v, says she hasn't liked the food otherwise she would be eating more. She feels the lidocaine  patch helped with her sternal pain. Denies any fevers/chills/sweats, SOB, or palpitations. Her headache has improved with Tylenol .   OBJECTIVE:  Vital Signs: Vitals:   06/06/23 2315 06/07/23 0552 06/07/23 0754 06/07/23 1018  BP: 121/68 (!) 115/54  (!) 115/54  Pulse: 70 69 65 65  Resp: 17 18 16    Temp: 97.6 F (36.4 C) 98 F (36.7 C)    TempSrc:  Oral    SpO2: 95% 93% 98%   Weight:      Height:       Supplemental O2: Nasal Cannula SpO2: 98 % O2 Flow Rate (L/min): 0 L/min FiO2 (%): 21 %  Filed Weights   06/04/23 0316 06/05/23 0414 06/06/23 0358  Weight: 90.3 kg 99.5 kg 101.4 kg     Intake/Output Summary (Last 24 hours) at 06/07/2023 1058 Last data filed at 06/07/2023 0552 Gross per 24 hour  Intake 600 ml  Output 500 ml  Net 100 ml    Net IO Since Admission: 4,750.39 mL [06/07/23 1058]  Physical Exam Constitutional:      Appearance: Normal appearance.  Cardiovascular:     Rate and Rhythm: Normal rate and regular rhythm.     Heart sounds: No murmur heard.    No friction rub. No gallop.  Pulmonary:     Effort: Pulmonary effort is normal.      Breath sounds: Normal breath sounds.  Abdominal:     General: Abdomen is flat. Bowel sounds are normal.     Palpations: Abdomen is soft.  Musculoskeletal:     Cervical back: No rigidity.  Skin:    General: Skin is warm and dry.  Neurological:     General: No focal deficit present.     Mental Status: She is alert and oriented to person, place, and time.     CBC     Component Value Date/Time   WBC 9.0 06/06/2023 0608   RBC 3.59 (L) 06/06/2023 0608   HGB 10.1 (L) 06/06/2023 0608   HCT 31.1 (L) 06/06/2023 0608   PLT 365 06/06/2023 0608   MCV 86.6 06/06/2023 0608   MCH 28.1 06/06/2023 0608   MCHC 32.5 06/06/2023 0608   RDW 16.7 (H) 06/06/2023 0608   LYMPHSABS 2.5 06/05/2023 1330   MONOABS 0.8 06/05/2023 1330   EOSABS 0.3 06/05/2023 1330   BASOSABS 0.3 (H) 06/05/2023 1330   CMP    Component Value Date/Time   NA 138 06/07/2023 0643   K 3.4 (L) 06/07/2023 0643   CL 105 06/07/2023 0643   CO2 24 06/07/2023 0643   GLUCOSE 85 06/07/2023 0643   BUN 12 06/07/2023 0643   CREATININE  0.74 06/07/2023 0643   CALCIUM  8.2 (L) 06/07/2023 0643   PROT 5.7 (L) 05/27/2023 0409   ALBUMIN 2.0 (L) 05/27/2023 0409   AST 30 05/27/2023 0409   ALT 15 05/27/2023 0409   ALKPHOS 70 05/27/2023 0409   BILITOT 0.5 05/27/2023 0409   GFRNONAA >60 06/07/2023 0643   X-ray pelvis:   FINDINGS: The bones are subjectively under mineralized. This limits assessment for acute fracture. Allowing for this, no evidence of acute fracture. No hip dislocation. Mild bilateral hip degenerative change. Pubic symphysis and sacroiliac joints are congruent. No evidence of focal bone abnormality or erosions.   IMPRESSION: 1. No acute fracture of the pelvis. 2. Mild bilateral hip degenerative change.  Echocardiogram:    1. Left ventricular ejection fraction, by estimation, is 40 to 45%. The  left ventricle has mildly decreased function. The left ventricle  demonstrates global hypokinesis.   2. Right ventricular  systolic function is normal. The right ventricular  size is normal.   3. Left atrial size was severely dilated.   4. Right atrial size was mildly dilated.   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   6. The aortic valve is tricuspid. There is moderate calcification of the  aortic valve.   7. The inferior vena cava is normal in size with greater than 50%  respiratory variability, suggesting right atrial pressure of 3 mmHg.   Conclusion(s)/Recommendation(s): Limited echo to reassess EF. EF improved from previous. There is a large cystic lesion in the liver, recommned dedicated imaging, if clinically indicated.   FINDINGS   Left Ventricle: Left ventricular ejection fraction, by estimation, is 40  to 45%. The left ventricle has mildly decreased function. The left  ventricle demonstrates global hypokinesis.   Right Ventricle: The right ventricular size is normal. Right ventricular  systolic function is normal.   Left Atrium: Left atrial size was severely dilated.   Right Atrium: Right atrial size was mildly dilated.   Mitral Valve: The mitral valve is myxomatous. There is moderate thickening of the mitral valve leaflet(s).   Aortic Valve: The aortic valve is tricuspid. There is moderate  calcification of the aortic valve.   Venous: The inferior vena cava is normal in size with greater than 50% respiratory variability, suggesting right atrial pressure of 3 mmHg.    ASSESSMENT/PLAN:  Assessment: Principal Problem:   Lumbar discitis Active Problems:   Altered mental status   Acute encephalopathy   Rhinovirus   Septic shock (HCC)   Osteomyelitis of lumbar spine (HCC)   On mechanically assisted ventilation (HCC)   Acute respiratory failure with hypoxia (HCC)   Diskitis   Bacterial meningitis   Acute systolic heart failure (HCC)   PAC (premature atrial contraction)   Meningitis  Morgan Morgan is a 73 y.o. female with asthma and hypertension who presented on 05/25/2023 with  encephalopathy and was admitted with severe bacterial meningitis and lumbar osteomyelitis now complicated by non-ischemic cardiomyopathy on hospital day 13.   Plan: #Meningitis with discitis #Acute encephalopathy, improving Continues to follow with ID. Remains afebrile and hemodynamically stable. Leukocytosis resolved at 9.8 yesterday. Mental status continues to improve. No changes to her antibiotic course and will pursue 6-week treatment with 2 weeks of  2 g IV Rocephin  q12 and 750 mg IV vancomycin  q12 for CNS penetration. This will be followed by 4 weeks of Rocephin  2 g daily and daptomycin  500 mg IV daily. PICC line was placed this morning for continued IV antibiotic treatment when she discharges to a SNF. -  Follow-up ID recs - Continue IV vancomycin  750 mg every 12 hours and her Rocephin  2 g every 12 hours through 4/22 followed by IV Dapto 500 mg every 24 hours and Rocephin  2 g every 24 hours through 5/20   #Acute systolic heart failure, resolving HF likely in the setting of her acute encephalopathy, EF of 20-25% on 4/10. Repeat echo yesterday showed EF of 40-45%. Cardiology is following and began GDMT- regimen is currently Spironolactone  12.5 mg daily, Coreg  3.125 mg twice daily, and Cozaar  12.5 mg. She remains euvolemic with no signs of volume overload on exam, cardiology recommending 20 mg PO Lasix  as needed.  - Continue Spiro 12.5 mg daily, Coreg  3.5 mg twice daily, and Losartan  12.5 mg daily -20 mg PO Lasix  as needed - Follow-up cardiology recs  #Atrial fibrillation versus frequent PACs Concern for Afib when patient was in ICU. Eliquis  was stopped on 4/18 as cardiology determined patient was having frequent PACs instead of Afib. Amiodarone  was started on 4/19 to help suppress PACs. At this time cardiology plans to continue amiodarone  BID and transition to once daily on 4/27. Coreg  was also started on 4/19 to address both HF and PACs. Repeat EKG pending, per telemetry her PAC burden has  decreased. - Repeat EKG today - Follow-up cardiology recs - Continue amiodarone  200 mg BID (last dose on 4/27) then once daily until 1 month of total amiodarone  therapy - Continue carvedilol  3.125 mg BID    #Lightheadedness, dizziness Patient fell due to dizziness upon getting up from bed yesterday, felt dizzy when we asked her to sit up in bed. She is now on GDMT for her HF which could be contributing to symptoms. She had one BP with MAP of 61 last night but since has been relatively normotensive.  -follow up orthostatic vital signs -fall precautions in place -PT/OT following  #Hypoglycemia, resolved #Poor p.o. intake, improving She has been tolerating clear liquids well and had no hypoglycemic episodes overnight. SLP saw patient yesterday and cleared patient for a regular diet, no longer needs to be followed by speech therapy. She has been eating small bits of food with no dysphagia, nausea or vomiting, just does not like the food here.  #Acute Hypoxic Respiratory Failure 2/2 encephalopathy, resolved Patient is saturating in high 90s on room air with no increased work of breathing, feels she is coughing less with the nebulizer treatments and Tessalon  pearls. -Continue on Brovana  and Pulmicort   Best Practice: Diet: Regular diet IVF: None VTE: SCDs Start: 05/25/23 1528 Code: Full AB: Vanc + rocephin  until 4/22, then dapto and rocephin  until 5/20 Therapy Recs:  PT and OT following, recommend intensive inpatient skilled rehab services < 3 hours per day to maximize rehab potential.  DISPO: Anticipated discharge  TBD  to Skilled nursing facility pending  IV antibiotics and mental status .  CSW discussed with patient and patient's brother Cornel Diesel who are agreeable with plan to discharge to SNF preferably in [place].   Morgan Morgan, [facility] MS4  Please contact the on call pager after 5 pm and on weekends at [phone].

## 2023-06-07 NOTE — TOC Progression Note (Addendum)
 Transition of Care [facility]) - Progression Note    Patient Details  Name: Morgan Morgan MRN: [id number] Date of Birth: [birth year]  Transition of Care [facility] CM/SW Contact  Carmon Christen, LCSWA Phone Number: 06/07/2023, 3:20 PM  Clinical Narrative:     CSW spoke with patient at bedside. CSW provided SNF bed offers to patient. Patient accepted SNF bed offer with [facility] in [place] in [place].  [facility] admissions confirmed SNF bed for patient. Patients insurance authorization has been approved for SNF 4/23-4/25 [place] [id number] . CSW will continue to follow.   Expected Discharge Plan: Skilled Nursing Facility Barriers to Discharge: Continued Medical Work up  Expected Discharge Plan and Services   Discharge Planning Services: CM Consult   Living arrangements for the past 2 months: Single Family Home                                       Social Determinants of Health (SDOH) Interventions SDOH Screenings   Food Insecurity: Patient Unable To Answer (05/26/2023)  Housing: Patient Unable To Answer (05/26/2023)  Transportation Needs: Patient Unable To Answer (05/26/2023)  Utilities: Patient Unable To Answer (05/26/2023)  Social Connections: Patient Unable To Answer (05/26/2023)  Tobacco Use: Unknown (05/25/2023)    Readmission Risk Interventions     No data to display

## 2023-06-08 DIAGNOSIS — M4646 Discitis, unspecified, lumbar region: Secondary | ICD-10-CM | POA: Diagnosis not present

## 2023-06-08 LAB — BASIC METABOLIC PANEL WITH GFR
Anion gap: 8 (ref 5–15)
BUN: 12 mg/dL (ref 8–23)
CO2: 25 mmol/L (ref 22–32)
Calcium: 8.1 mg/dL — ABNORMAL LOW (ref 8.9–10.3)
Chloride: 105 mmol/L (ref 98–111)
Creatinine, Ser: 0.74 mg/dL (ref 0.44–1.00)
GFR, Estimated: >60 mL/min (ref >60)
Glucose, Bld: 89 mg/dL (ref 70–99)
Potassium: 3.4 mmol/L — ABNORMAL LOW (ref 3.5–5.1)
Sodium: 138 mmol/L (ref 135–145)

## 2023-06-08 LAB — GLUCOSE, CAPILLARY
Glucose-Capillary: 102 mg/dL — ABNORMAL HIGH (ref 70–99)
Glucose-Capillary: 104 mg/dL — ABNORMAL HIGH (ref 70–99)
Glucose-Capillary: 104 mg/dL — ABNORMAL HIGH (ref 70–99)
Glucose-Capillary: 114 mg/dL — ABNORMAL HIGH (ref 70–99)
Glucose-Capillary: 87 mg/dL (ref 70–99)
Glucose-Capillary: 93 mg/dL (ref 70–99)

## 2023-06-08 MED ORDER — POTASSIUM CHLORIDE 10 MEQ/100ML IV SOLN
10.0000 meq | INTRAVENOUS | Status: AC
  Administered 2023-06-08 – 2023-06-09 (×4): 10 meq via INTRAVENOUS
  Filled 2023-06-08 (×4): qty 100

## 2023-06-08 MED ORDER — POTASSIUM CHLORIDE 20 MEQ PO PACK
40.0000 meq | PACK | Freq: Once | ORAL | Status: DC
  Filled 2023-06-08: qty 2

## 2023-06-08 MED ORDER — INSULIN ASPART 100 UNIT/ML IJ SOLN
0.0000 [IU] | Freq: Three times a day (TID) | INTRAMUSCULAR | Status: DC
  Administered 2023-06-10: 1 [IU] via SUBCUTANEOUS

## 2023-06-08 MED ORDER — LACTATED RINGERS IV BOLUS
1000.0000 mL | Freq: Once | INTRAVENOUS | Status: AC
  Administered 2023-06-08: 1000 mL via INTRAVENOUS

## 2023-06-08 NOTE — Discharge Summary (Incomplete)
 Name: Morgan Morgan MRN: [id number] DOB: August 18, 1950 73 y.o. PCP: Ezell Hollow, NP  Date of Admission: 05/25/2023 10:48 AM Date of Discharge:  06/10/2023 Attending Physician: Dr. Adriane Albe  DISCHARGE DIAGNOSIS:  Primary Problem: Lumbar discitis   Hospital Problems: Principal Problem:   Lumbar discitis Active Problems:   Hypertension   Altered mental status   Acute encephalopathy   Rhinovirus   Septic shock (HCC)   Osteomyelitis of lumbar spine (HCC)   On mechanically assisted ventilation (HCC)   Acute respiratory failure with hypoxia (HCC)   Diskitis   Bacterial meningitis   Acute systolic heart failure (HCC)   PAC (premature atrial contraction)   Meningitis    Presumed meningitis and discitis: continue IV antibiotics through picc line until 07/05/23, will need weekly CK labs on daptomycin   HF: will need GDMT management of her new HF in the setting of recent low blood pressure. We are currently holding her spironolactone  and losartan   PAC: developed in setting of illness, currently on amiodarone  taper    DISCHARGE MEDICATIONS:   Allergies as of 06/10/2023       Reactions   Zithromax [azithromycin] Itching        Medication List     STOP taking these medications    amLODipine 5 MG tablet Commonly known as: NORVASC       TAKE these medications    acetaminophen  325 MG tablet Commonly known as: TYLENOL  Take 2 tablets (650 mg total) by mouth every 6 (six) hours as needed for headache or fever.   albuterol  108 (90 Base) MCG/ACT inhaler Commonly known as: VENTOLIN  HFA Inhale into the lungs every 6 (six) hours as needed for wheezing or shortness of breath.   amiodarone  200 MG tablet Commonly known as: PACERONE  Take 1 tablet (200 mg total) by mouth 2 (two) times daily for 2 days, THEN 1 tablet (200 mg total) daily. Start taking on: June 10, 2023   arformoterol  15 MCG/2ML Nebu Commonly known as: BROVANA  Take 2 mLs (15 mcg total) by nebulization 2 (two)  times daily.   benzonatate  100 MG capsule Commonly known as: TESSALON  Take 1 capsule (100 mg total) by mouth 3 (three) times daily as needed for cough.   budesonide  0.25 MG/2ML nebulizer solution Commonly known as: PULMICORT  Take 2 mLs (0.25 mg total) by nebulization 2 (two) times daily.   carvedilol  3.125 MG tablet Commonly known as: COREG  Take 1 tablet (3.125 mg total) by mouth 2 (two) times daily with a meal.   cefTRIAXone  2 g in sodium chloride  0.9 % 100 mL Inject 2 g into the vein daily. Start taking on: June 11, 2023   D3 Super Strength 50 MCG (2000 UT) Caps Generic drug: Cholecalciferol Take 1 capsule by mouth daily.   daptomycin  500-0.9 MG/50ML-% Soln Commonly known as: CUBICIN  Inject 50 mLs (500 mg total) into the vein daily at 2 PM.   feeding supplement Liqd Take 237 mLs by mouth 2 (two) times daily between meals. Start taking on: June 11, 2023   lidocaine  5 % Commonly known as: LIDODERM  Place 1 patch onto the skin daily. Remove & Discard patch within 12 hours or as directed by MD Start taking on: June 11, 2023   mometasone -formoterol  100-5 MCG/ACT Aero Commonly known as: DULERA  Inhale 2 puffs into the lungs 2 (two) times daily.   polyethylene glycol 17 g packet Commonly known as: MIRALAX  / GLYCOLAX  Take 17 g by mouth daily as needed for moderate constipation.  Discharge Care Instructions  (From admission, onward)           Start     Ordered   06/10/23 0000  Discharge wound care:       Comments: Clean L shin wound with NS, apply Xeroform gauze daily Timm Foot [phone]) and cover with silicone foam.  Keep pressure off area   06/10/23 1433            DISPOSITION AND FOLLOW-UP:  Morgan Morgan was discharged from [facility] in fair condition. At the hospital follow up visit please address:  Follow-up Recommendations: Consults: Cardiology Labs:     CK weekly (pt on daptomycin )  Studies:  EKG Medications:  Daptomycin  500 mg IV every 24 hours thru 07/05/23 Rocephin  2g IV every 24 hours thru 07/05/23  Amiodarone  200 mg BID until 4/26, Amiodarone  200 mg daily starting 4/27 Carvedilol  3.125 mg BID  Follow-up Appointments:  Contact information for after-discharge care     Destination     [facility] .   Service: Skilled Nursing Contact information: [address] [place]  [phone]                    - follow up appointment with Dr. Ernie Heal at [facility] on 07/04/2023 at 2:15 PM - follow-up appointment with Palmer Bobo, NP at [facility] at [facility] on 06/22/2023 at 8:50 AM    HOSPITAL COURSE:  Morgan Summary:  #Meningitis with discitis #Acute encephalopathy, improved Morgan presented for a code stroke after her 76 yo grandson found her after a fall with no last known normal. She was encephalopathic on presentation with GCS of 7. She developed septic shock with fever of 102, lactate of 14.4, leukocytosis of 21. Brain imaging including CT, CTA, and MRI were negative for signs of hemorrhage or ischemia, EEG was concerning for diffuse encephalopathy but negative for seizures. She was started on empiric meningitis treatment with Vancomycin , Ceftriaxone , Ampicillin , and acyclovir  on 4/9. Morgan was transferred to ICU on 4/10 for intubation where she required vasopressors to maintain MAP>65. MRI of spine on 4/10 was concerning for early L2-L3 discitis/osteomyelitis. Lumbar puncture was attempted with IR but only blood was obtained thus CSF analysis was not able to be done. Bacterial meningitis was highest on our differential due to her elevated white count, fever, elevated procalcitonin, and MRI findings suggestive of meningitis although never confirmed with CSF studies. Her blood cultures were negative although they were taken after she was started on antibiotics,  fungicell test negative, and respiratory panel only positive for rhinovirus/enterovirus. She was taken off acyclovir  and ampicillin  on 4/14 due to low concern for HSV and Listeria. Her mental status slowly improved and she was extubated, taken off pressors and transferred back to our team on 4/19. Her leukocytosis has since resolved at 9 and she has remained afebrile. Her mental status has greatly improved since admission. Per ID recs, she has completed 2 weeks of 2 g IV Rocephin  q12 and 750 mg IV vancomycin  q12 for presumed meningitis, and she began a 4 week course of Rocephin  2 g daily and daptomycin  500 mg IV on 4/23. She has a PICC line for continued IV antibiotic treatment upon discharge to SNF. - continue IV Dapto 500 mg every 24 hours and Rocephin  2 g every 24 hours through 5/20  - follow up appt on 07/04/2023 at 2:15 PM, with Dr. Ernie Heal at [facility]   #Acute systolic heart failure, resolving Morgan Morgan developed acute systolic HF w/ signs of pulmonary edema on chest x-ray and respiratory distress likely in the setting of her acute encephalopathy with an EF of 20-25% on 4/10. Repeat echo on 4/21 showed EF of 40-45%. Cardiology has been following and began GDMT- regimen of Spironolactone  12.5 mg daily, Coreg  3.125 mg twice daily, and Losartan  12.5 mg. She has remained euvolemic with no signs of volume overload on exam and high SpO2 on room air. Given recent symptomatic hypotension we have held her Spironolactone  and Losartan  with cardiologist aware. She did not have orthostasis this morning but continues to have soft pressures, thus we will continue to hold Spironolactone  and Losartan  upon discharge. - Continue carvedilol  3.125 mg BID  - holding Spironolactone  and Losartan  due to low pressures - Follow-up cardiology appointment on 06/22/2023 w/ Palmer Bobo, NP    #Frequent PACs There was a concern for Afib when Morgan was in the ICU. Eliquis  was stopped  on 4/18 as cardiology determined Morgan was having frequent PACs instead of Afib. Amiodarone  was started on 4/19 to help suppress PACs. At this time cardiology plans to continue amiodarone  BID and transition to once daily on 4/27. Coreg  was also started on 4/19 to address both HF and PACs.  - Continue amiodarone  200 mg BID (last dose on 4/27) then 200 mg once daily until 1 month of total amiodarone  therapy - Continue carvedilol  3.125 mg BID  - Follow-up cardiology appointment on 06/22/2023 w/ Palmer Bobo, NP    #Symptomatic orthostatic hypotension Morgan fell due to dizziness upon getting up from bed on 4/21 and had orthostatic hypotension on 4/24. She was placed on GDMT for her new onset HF which could be contributing to her symptoms, however her EF improved from 20-25% to 40-45% during this hospitalization. We thus held her spironolactone  and losartan  for two mornings now due to concern that these medications were contributing to her orthostasis. ACTH stim test this morning was within normal limits. She did not have orthostasis this morning but still has soft blood pressures, thus we will continue to hold spironolactone  and losartan  upon discharge.    #Hypoglycemia, resolved #Poor p.o. intake, improving Morgan Morgan initially had episodes of hypoglycemia in the setting of poor PO intake during illness recovery. These have since resolved as she has started to eat and drink more.    #Acute Hypoxic Respiratory Failure 2/2 encephalopathy, resolved Morgan Morgan developed respiratory failure and required intubation during her encephalopathy. She was extubated on 4/18 and has since been saturating well on room air with no increased work of breathing. She has lingering cough which has been managed with nebulizer treatments and Tessalon  pearls.   DISCHARGE INSTRUCTIONS:   Discharge Instructions     (HEART FAILURE PATIENTS) Call MD:  Anytime you have any of the following symptoms: 1) 3 pound weight  gain in 24 hours or 5 pounds in 1 week 2) shortness of breath, with or without a dry hacking cough 3) swelling in the hands, feet or stomach 4) if you have to sleep on extra pillows at night in order to breathe.   Complete by: As directed    Call MD for:  extreme fatigue   Complete by: As directed    Call MD for:  persistant dizziness or light-headedness   Complete by: As directed    Call MD for:  persistant nausea and vomiting   Complete by: As directed  Call MD for:  severe uncontrolled pain   Complete by: As directed    Call MD for:  temperature >100.4   Complete by: As directed    Diet - low sodium heart healthy   Complete by: As directed    Discharge wound care:   Complete by: As directed    Clean L shin wound with NS, apply Xeroform gauze daily Timm Foot [phone]) and cover with silicone foam.  Keep pressure off area   Increase activity slowly   Complete by: As directed        SUBJECTIVE:   Morgan Morgan is lying in bed this morning, complains of a bad headache w/ some photophobia. She got Tylenol  2 hours ago which has started to help. She said she felt slightly dizzy yesterday during PT but this has since improved. She denies any chest pain or SOB this morning.   Discharge Vitals:   BP (!) 121/56 (BP Location: Left Arm)   Pulse 65   Temp 98.8 F (37.1 C) (Oral)   Resp 17   Ht 5\' 1"  (1.549 m)   Wt 105.1 kg   SpO2 99%   BMI 43.78 kg/m   OBJECTIVE:  Physical Exam   Constitutional:      Well-appearing, lying in bed in no acute distress HEENT:  Moist mucous membranes Cardiovascular:     Rate and Rhythm: Normal rate and regular rhythm.     Heart sounds: No murmur heard. No friction rub. No gallop. Pulmonary:  LCTA bilaterally, normal work of breathing on RA.  Abdominal:     General: Abdomen is flat, non-distended. Bowel sounds are normal.     Palpations: Abdomen is soft, non-tender to palpation in all quadrants.  Musculoskeletal:     No nuchal rigidity.  Skin:     General: Skin is warm and dry, no skin tenting, trace LE edema. Bandage covering wound on LLE Neurological:     General: No focal deficit present.     Mental Status: She is alert and oriented to person, place, and time.  Pertinent Labs, Studies, and Procedures:     Latest Ref Rng & Units 06/06/2023    6:08 AM 06/05/2023    1:30 PM 06/04/2023   11:36 AM  CBC  WBC 4.0 - 10.5 K/uL 9.0  9.8  12.2   Hemoglobin 12.0 - 15.0 g/dL 98.1  19.1  47.8   Hematocrit 36.0 - 46.0 % 31.1  32.6  34.4   Platelets 150 - 400 K/uL 365  383  432        Latest Ref Rng & Units 06/09/2023   12:30 PM 06/08/2023    5:59 AM 06/07/2023    6:43 AM  CMP  Glucose 70 - 99 mg/dL 98  89  85   BUN 8 - 23 mg/dL 10  12  12    Creatinine 0.44 - 1.00 mg/dL 2.95  6.21  3.08   Sodium 135 - 145 mmol/L 138  138  138   Potassium 3.5 - 5.1 mmol/L 4.0  3.4  3.4   Chloride 98 - 111 mmol/L 107  105  105   CO2 22 - 32 mmol/L 23  25  24    Calcium  8.9 - 10.3 mg/dL 8.4  8.1  8.2    ACTH stim test cortisol:  Base: 9.1 30 min: 16.7  60 min: 17.8  EEG adult Result Date: 05/26/2023 Arleene Lack, MD     05/26/2023  4:04 PM Morgan Morgan MRN: [id number]  Epilepsy Attending: Arleene Lack Referring Physician/Provider: Laymond Priestly, NP Date: 05/26/2023 Duration: 23.20 mins Morgan history: 73 y.o. female with acute encephalopathy of unclear origin. EEG to evaluate for seizure Level of alertness: comatose, AEDs during EEG study: LEV, propofol  Technical aspects: This EEG study was done with scalp electrodes positioned according to the 10-20 International system of electrode placement. Electrical activity was reviewed with band pass filter of 1-70Hz , sensitivity of 7 uV/mm, display speed of 26mm/sec with a 60Hz  notched filter applied as appropriate. EEG data were recorded continuously and digitally stored.  Video monitoring was available and reviewed as appropriate. Description: EEG showed continuous generalized low amplitude  3-5Hz  theta-delta slowing admixed with 12-14hz  beta activity. Hyperventilation and photic stimulation were not performed.    ABNORMALITY - Continuous slow, generalized  IMPRESSION: This study is suggestive of severe diffuse encephalopathy. No seizures or epileptiform discharges were seen throughout the recording.  Priyanka Suzanne Erps   ECHOCARDIOGRAM COMPLETE IMPRESSIONS  1. Left ventricular ejection fraction, by estimation, is 20 to 25%. The left ventricle has severely decreased function. The left ventricle demonstrates global hypokinesis. There is mild left ventricular hypertrophy. Left ventricular diastolic parameters  are indeterminate.  2. Right ventricular systolic function is normal. The right ventricular size is normal. There is normal pulmonary artery systolic pressure.  3. Left atrial size was severely dilated.  4. The mitral valve is myxomatous. Trivial mitral valve regurgitation. No evidence of mitral stenosis.  5. The aortic valve is calcified. Aortic valve regurgitation is not visualized. Aortic valve sclerosis/calcification is present, without any evidence of aortic stenosis.  6. The inferior vena cava is normal in size with greater than 50% respiratory variability, suggesting right atrial pressure of 3 mmHg. Comparison(s): No prior Echocardiogram. FINDINGS  Left Ventricle: Left ventricular ejection fraction, by estimation, is 20 to 25%. The left ventricle has severely decreased function. The left ventricle demonstrates global hypokinesis. The left ventricular internal cavity size was normal in size. There is mild left ventricular hypertrophy. Left ventricular diastolic parameters are indeterminate. Right Ventricle: The right ventricular size is normal. No increase in right ventricular wall thickness. Right ventricular systolic function is normal. There is normal pulmonary artery systolic pressure. Left Atrium: Left atrial size was severely dilated. Right Atrium: Right atrial size was normal in size.  Pericardium: There is no evidence of pericardial effusion.    MR Brain Wo Contrast (neuro protocol) Result Date: 05/25/2023 CLINICAL DATA:  Provided history: Neuro deficit, acute, stroke suspected. Morgan found minimally responsive on floor at home. EXAM: MRI HEAD WITHOUT CONTRAST TECHNIQUE: Multiplanar, multiecho pulse sequences of the brain and surrounding structures were obtained without intravenous contrast. COMPARISON:  Non-contrast head CT and CT angiogram head/neck 05/25/2023. FINDINGS: Intermittently motion degraded examination (with up to moderate motion degradation of the heart sequences). Brain: No age-advanced or lobar predominant cerebral atrophy. Multifocal T2 FLAIR hyperintense signal abnormality within the cerebral white matter, nonspecific but compatible with mild chronic small vessel ischemic disease. No cortical encephalomalacia is identified. There is no acute infarct. No evidence of an intracranial mass. No chronic intracranial blood products. No extra-axial fluid collection. No midline shift. Vascular: Maintained flow voids within the proximal large arterial vessels. Skull and upper cervical spine: No focal worrisome marrow lesion. Sinuses/Orbits: No mass or acute finding within the imaged orbits. Moderate mucosal thickening within the bilateral maxillary, right sphenoid, bilateral ethmoid and bilateral frontal sinuses. IMPRESSION: 1. Intermittently motion degraded examination. 2. No evidence of an acute intracranial abnormality. The diffusion-weighted imaging is of good  quality and there is no evidence of an acute infarct. 3. Mild cerebral white matter chronic small vessel ischemic disease. 4. Paranasal sinus mucosal thickening as described. Electronically   DG Chest Port 1 View Result Date: 05/25/2023 CLINICAL DATA:  Altered mental status. EXAM: PORTABLE CHEST 1 VIEW COMPARISON:  July 26, 2012. FINDINGS: Mild cardiomegaly is noted. Possible mild central pulmonary vascular congestion is  noted. No consolidative process is noted. Bony thorax is unremarkable. IMPRESSION: Mild cardiomegaly with possible mild central pulmonary vascular congestion.   CT C-SPINE NO CHARGE Result Date: 05/25/2023 CLINICAL DATA:  Syncope EXAM: CT CERVICAL SPINE WITHOUT CONTRAST TECHNIQUE: Multidetector CT imaging of the cervical spine was performed without intravenous contrast. Multiplanar CT image reconstructions were also generated. RADIATION DOSE REDUCTION: This exam was performed according to the departmental dose-optimization program which includes automated exposure control, adjustment of the mA and/or kV according to Morgan size and/or use of iterative reconstruction technique. COMPARISON:  Same day CT head. FINDINGS: Alignment: Alignment is maintained. No listhesis. No facet subluxation or dislocation. Skull base and vertebrae: No compression fracture or displaced fracture in the cervical spine. No suspicious osseous lesion. Soft tissues and spinal canal: No prevertebral fluid or swelling. No visible canal hematoma. Disc levels: Disc space narrowing at C5-6 and C6-7. Disc osteophyte complexes at C5-6 and C6-7 resulting in mild spinal canal stenosis. Facet arthrosis at multiple levels. Foraminal narrowing is most pronounced on the left at C5-6 and C6-7. Upper chest: Negative. Other: None. IMPRESSION: No acute fracture or traumatic malalignment of the cervical spine.  CT ANGIO HEAD NECK W WO CM W PERF (CODE STROKE) Result Date: 05/25/2023 FINDINGS: CT Brain Perfusion Findings: CTA NECK FINDINGS Aortic arch: Standard configuration of the aortic arch. Imaged portion shows no evidence of aneurysm or dissection. Mild atherosclerosis of the aortic arch. No significant stenosis of the major arch vessel origins. Pulmonary arteries: As permitted by contrast timing, there are no filling defects in the visualized pulmonary arteries. Subclavian arteries: The subclavian arteries are patent bilaterally. Right carotid system:  Patent. Mild atherosclerosis at the carotid bifurcation without hemodynamically significant stenosis. No evidence of dissection. Left carotid system: No evidence of dissection, stenosis (50% or greater), or occlusion. Vertebral arteries: Codominant. No evidence of dissection, stenosis (50% or greater), or occlusion. Skeleton: No acute findings. Degenerative changes in the cervical spine. Other neck: Redemonstrated prominence of the bilateral optic nerves and possible tortuosity of the optic nerves. The visualized airway is patent. No cervical lymphadenopathy. Upper chest: Visualized lung apices are clear.  FINDINGS ANTERIOR CIRCULATION: The intracranial ICAs are patent bilaterally. Atherosclerosis of the carotid siphons resulting in mild stenosis of the bilateral cavernous ICAs. No high-grade stenosis, proximal occlusion, aneurysm, or vascular malformation. MCAs: The middle cerebral arteries are patent bilaterally. ACAs: The anterior cerebral arteries are patent bilaterally. POSTERIOR CIRCULATION: No significant stenosis, proximal occlusion, aneurysm, or vascular malformation. PCAs: The posterior cerebral arteries are patent bilaterally. Pcomm: The posterior communicating arteries are visualized bilaterally. SCAs: The superior cerebellar arteries are patent bilaterally. Basilar artery: Patent AICAs: Patent PICAs: Patent Vertebral arteries: The intracranial vertebral arteries are patent. Venous sinuses: As permitted by contrast timing, patent. Anatomic variants: None Review of the MIP images confirms the above findings IMPRESSION: No large vessel occlusion. No core infarct identified on CT perfusion. Small focus of elevated T-max in left frontal lobe is likely artifactual. No high-grade stenosis of the carotid and vertebral arteries in the neck. Atherosclerosis resulting in mild stenosis of the bilateral cavernous ICAs. Otherwise no stenosis of  the intracranial arterial vasculature. Prominence of the bilateral optic  nerves. Consider correlation with fundoscopic exam. Aortic Atherosclerosis (ICD10-I70.0).   CT HEAD CODE STROKE WO CONTRAST Result Date: 05/25/2023  FINDINGS: Brain: No acute intracranial hemorrhage. No CT evidence of acute infarct. Nonspecific hypoattenuation in the periventricular and subcortical white matter favored to reflect chronic microvascular ischemic changes. No edema, mass effect, or midline shift. The basilar cisterns are patent. Ventricles: The ventricles are normal. Vascular: Atherosclerotic calcifications of the carotid siphons. No hyperdense vessel. Skull: No acute or aggressive finding. Orbits: Orbits are symmetric. There is suggestion of prominence of the bilateral optic nerves. Sinuses: Mucosal thickening throughout the visualized paranasal sinuses. No air-fluid levels.    Signed:  Barbaraann Bookbin[facility] MS4 Pager: [phone]    Attestation for Student Documentation:  I personally was present and re-performed the history, physical exam and medical decision-making activities of this service and have verified that the service and findings are accurately documented in the student's note.  Nooruddin, Saad, MD 06/10/2023, 2:58 PM

## 2023-06-08 NOTE — Plan of Care (Signed)
 Problem: Education: Goal: Knowledge of General Education information will improve Description: Including pain rating scale, medication(s)/side effects and non-pharmacologic comfort measures 06/08/2023 1502 by Ardeen Beath, RN Outcome: Progressing 06/08/2023 1502 by Ardeen Beath, RN Outcome: Progressing   Problem: Health Behavior/Discharge Planning: Goal: Ability to manage health-related needs will improve 06/08/2023 1502 by Ardeen Beath, RN Outcome: Progressing 06/08/2023 1502 by Ardeen Beath, RN Outcome: Progressing   Problem: Clinical Measurements: Goal: Ability to maintain clinical measurements within normal limits will improve 06/08/2023 1502 by Ardeen Beath, RN Outcome: Progressing 06/08/2023 1502 by Ardeen Beath, RN Outcome: Progressing Goal: Will remain free from infection 06/08/2023 1502 by Ardeen Beath, RN Outcome: Progressing 06/08/2023 1502 by Ardeen Beath, RN Outcome: Progressing Goal: Diagnostic test results will improve 06/08/2023 1502 by Ardeen Beath, RN Outcome: Progressing 06/08/2023 1502 by Ardeen Beath, RN Outcome: Progressing Goal: Respiratory complications will improve 06/08/2023 1502 by Ardeen Beath, RN Outcome: Progressing 06/08/2023 1502 by Ardeen Beath, RN Outcome: Progressing Goal: Cardiovascular complication will be avoided 06/08/2023 1502 by Ardeen Beath, RN Outcome: Progressing 06/08/2023 1502 by Ardeen Beath, RN Outcome: Progressing   Problem: Activity: Goal: Risk for activity intolerance will decrease 06/08/2023 1502 by Ardeen Beath, RN Outcome: Progressing 06/08/2023 1502 by Ardeen Beath, RN Outcome: Progressing   Problem: Nutrition: Goal: Adequate nutrition will be maintained 06/08/2023 1502 by Ardeen Beath, RN Outcome: Progressing 06/08/2023 1502 by Ardeen Beath, RN Outcome: Progressing   Problem: Coping: Goal: Level of anxiety will decrease 06/08/2023 1502 by Ardeen Beath, RN Outcome:  Progressing 06/08/2023 1502 by Ardeen Beath, RN Outcome: Progressing   Problem: Elimination: Goal: Will not experience complications related to bowel motility 06/08/2023 1502 by Ardeen Beath, RN Outcome: Progressing 06/08/2023 1502 by Ardeen Beath, RN Outcome: Progressing Goal: Will not experience complications related to urinary retention 06/08/2023 1502 by Ardeen Beath, RN Outcome: Progressing 06/08/2023 1502 by Ardeen Beath, RN Outcome: Progressing   Problem: Pain Managment: Goal: General experience of comfort will improve and/or be controlled 06/08/2023 1502 by Ardeen Beath, RN Outcome: Progressing 06/08/2023 1502 by Ardeen Beath, RN Outcome: Progressing   Problem: Safety: Goal: Ability to remain free from injury will improve 06/08/2023 1502 by Ardeen Beath, RN Outcome: Progressing 06/08/2023 1502 by Ardeen Beath, RN Outcome: Progressing   Problem: Skin Integrity: Goal: Risk for impaired skin integrity will decrease 06/08/2023 1502 by Ardeen Beath, RN Outcome: Progressing 06/08/2023 1502 by Ardeen Beath, RN Outcome: Progressing   Problem: Activity: Goal: Ability to tolerate increased activity will improve 06/08/2023 1502 by Ardeen Beath, RN Outcome: Progressing 06/08/2023 1502 by Ardeen Beath, RN Outcome: Progressing   Problem: Respiratory: Goal: Ability to maintain a clear airway and adequate ventilation will improve 06/08/2023 1502 by Ardeen Beath, RN Outcome: Progressing 06/08/2023 1502 by Ardeen Beath, RN Outcome: Progressing   Problem: Role Relationship: Goal: Method of communication will improve 06/08/2023 1502 by Ardeen Beath, RN Outcome: Progressing 06/08/2023 1502 by Ardeen Beath, RN Outcome: Progressing   Problem: Education: Goal: Ability to describe self-care measures that may prevent or decrease complications (Diabetes Survival Skills Education) will improve 06/08/2023 1502 by Ardeen Beath, RN Outcome:  Progressing 06/08/2023 1502 by Ardeen Beath, RN Outcome: Progressing Goal: Individualized Educational Video(s) 06/08/2023 1502 by Ardeen Beath, RN Outcome: Progressing 06/08/2023 1502 by Ardeen Beath, RN Outcome: Progressing   Problem: Coping: Goal:  Ability to adjust to condition or change in health will improve 06/08/2023 1502 by Ardeen Beath, RN Outcome: Progressing 06/08/2023 1502 by Ardeen Beath, RN Outcome: Progressing   Problem: Fluid Volume: Goal: Ability to maintain a balanced intake and output will improve 06/08/2023 1502 by Ardeen Beath, RN Outcome: Progressing 06/08/2023 1502 by Ardeen Beath, RN Outcome: Progressing   Problem: Health Behavior/Discharge Planning: Goal: Ability to identify and utilize available resources and services will improve 06/08/2023 1502 by Ardeen Beath, RN Outcome: Progressing 06/08/2023 1502 by Ardeen Beath, RN Outcome: Progressing Goal: Ability to manage health-related needs will improve 06/08/2023 1502 by Ardeen Beath, RN Outcome: Progressing 06/08/2023 1502 by Ardeen Beath, RN Outcome: Progressing   Problem: Metabolic: Goal: Ability to maintain appropriate glucose levels will improve 06/08/2023 1502 by Ardeen Beath, RN Outcome: Progressing 06/08/2023 1502 by Ardeen Beath, RN Outcome: Progressing   Problem: Nutritional: Goal: Maintenance of adequate nutrition will improve 06/08/2023 1502 by Ardeen Beath, RN Outcome: Progressing 06/08/2023 1502 by Ardeen Beath, RN Outcome: Progressing Goal: Progress toward achieving an optimal weight will improve 06/08/2023 1502 by Ardeen Beath, RN Outcome: Progressing 06/08/2023 1502 by Ardeen Beath, RN Outcome: Progressing   Problem: Skin Integrity: Goal: Risk for impaired skin integrity will decrease 06/08/2023 1502 by Ardeen Beath, RN Outcome: Progressing 06/08/2023 1502 by Ardeen Beath, RN Outcome: Progressing   Problem: Tissue  Perfusion: Goal: Adequacy of tissue perfusion will improve 06/08/2023 1502 by Ardeen Beath, RN Outcome: Progressing 06/08/2023 1502 by Ardeen Beath, RN Outcome: Progressing

## 2023-06-08 NOTE — Progress Notes (Signed)
 Subjective:   Summary: : Morgan Morgan is a 73 y.o. with a pertinent PMH of asthma and hypertension who presented with AMS and was admitted for acute encephalopathy secondary to suspected bacterial meningitis and lumbar discitis on hospital day 14.  Overnight Events:   [name] feels good this morning, feels her shortness of breath and coughing are much improved. Denies feeling dizzy/lightheaded when going from lying down to sitting up. She denies chest pain or palpitations. She still has a headache that is relieved with Tylenol .    Objective:  Vital signs in last 24 hours: Vitals:   06/08/23 0432 06/08/23 0751 06/08/23 0909 06/08/23 0913  BP: 125/65 (!) 122/52    Pulse: 72 60    Resp: 18 18    Temp: 98.4 F (36.9 C) 98.1 F (36.7 C)    TempSrc: Oral Oral    SpO2: 94% 95% 92% 92%  Weight: 104.2 kg     Height:       Supplemental O2: Room Air SpO2: 92 % O2 Flow Rate (L/min): 0 L/min FiO2 (%): 21 %   Physical Exam:   Constitutional:      Well-appearing, sitting up in bed in no acute distress Cardiovascular:     Rate and Rhythm: Normal rate and regular rhythm.     Heart sounds: No murmur heard. No friction rub. No gallop.  Pulmonary:   LCTA bilaterally, normal work of breathing on RA.  Abdominal:     General: Abdomen is flat. Bowel sounds are normal.     Palpations: Abdomen is soft, non-tender to palpation in all quadrants.  Musculoskeletal:     No nuchal rigidity.  Skin:    General: Skin is warm and dry, no LE edema  Neurological:     General: No focal deficit present.     Mental Status: She is alert and oriented to person, place, and time.  Filed Weights   06/05/23 0414 06/06/23 0358 06/08/23 0432  Weight: 99.5 kg 101.4 kg 104.2 kg     Intake/Output Summary (Last 24 hours) at 06/08/2023 1138 Last data filed at 06/08/2023 0436 Gross per 24 hour  Intake 957.86 ml  Output --  Net 957.86 ml   Net IO Since Admission: 5,708.25 mL [06/08/23  1138]  Pertinent Labs:    Latest Ref Rng & Units 06/06/2023    6:08 AM 06/05/2023    1:30 PM 06/04/2023   11:36 AM  CBC  WBC 4.0 - 10.5 K/uL 9.0  9.8  12.2   Hemoglobin 12.0 - 15.0 g/dL 47.8  29.5  62.1   Hematocrit 36.0 - 46.0 % 31.1  32.6  34.4   Platelets 150 - 400 K/uL 365  383  432        Latest Ref Rng & Units 06/08/2023    5:59 AM 06/07/2023    6:43 AM 06/06/2023    6:08 AM  CMP  Glucose 70 - 99 mg/dL 89  85  79   BUN 8 - 23 mg/dL 12  12  13    Creatinine 0.44 - 1.00 mg/dL 3.08  6.57  8.46   Sodium 135 - 145 mmol/L 138  138  139   Potassium 3.5 - 5.1 mmol/L 3.4  3.4  3.6   Chloride 98 - 111 mmol/L 105  105  106   CO2 22 - 32 mmol/L 25  24  25    Calcium  8.9 -  10.3 mg/dL 8.1  8.2  8.3     Assessment/Plan:   Principal Problem:   Lumbar discitis Active Problems:   Altered mental status   Acute encephalopathy   Rhinovirus   Septic shock (HCC)   Osteomyelitis of lumbar spine (HCC)   On mechanically assisted ventilation (HCC)   Acute respiratory failure with hypoxia (HCC)   Diskitis   Bacterial meningitis   Acute systolic heart failure (HCC)   PAC (premature atrial contraction)   Meningitis   Patient Summary:  Morgan Morgan is a 73 y.o. female with asthma and hypertension who presented on 05/25/2023 with encephalopathy and was admitted with severe bacterial meningitis and lumbar osteomyelitis now complicated by non-ischemic cardiomyopathy on hospital day 14.    Plan:  #Meningitis with discitis #Acute encephalopathy, improving Continues to follow with ID. Remains afebrile and hemodynamically stable. Leukocytosis resolved at 9.0 on 4/21. Mental status continues to improve, patient feels ready for discharge to SNF. Per ID recommendations she will complete a 6 week long antibiotic course. She has completed the first 2 weeks of  2 g IV Rocephin  q12 and 750 mg IV vancomycin  q12 for CNS penetration and treatment of suspected bacterial meningitis. Today she began 4 week  course of Rocephin  2 g daily and daptomycin  500 mg IV daily (4/23-5/20) which will address suspected L2-L3 discitis/osteomyelitis. PICC line was placed yesterday for continued IV antibiotic treatment when she discharges to a SNF. - Follow-up ID recs, ID appt scheduled for 5/19 with Dr. Ernie Heal - completed 2 weeks of IV vancomycin  750 mg every 12 hours and IV Rocephin  2 g every 12 hours on 4/22  - starting IV Dapto 500 mg every 24 hours and Rocephin  2 g every 24 hours today through 5/20    #Acute systolic heart failure, resolving HF likely in the setting of her acute encephalopathy, EF of 20-25% on 4/10. Repeat echo on 4/21 showed EF of 40-45%. Cardiology is following and began GDMT--regimen is currently Spironolactone  12.5 mg daily, Coreg  3.125 mg twice daily, and Cozaar  12.5 mg. She remains euvolemic with no signs of volume overload on exam, cardiology recommending 20 mg PO Lasix  as needed.  - Continue Spiro 12.5 mg daily, Coreg  3.125 mg twice daily, and Losartan  12.5 mg daily -20 mg PO Lasix  as needed - Follow-up cardiology appointment, consider SGLT2i  #Atrial fibrillation versus frequent PACs Concern for Afib when patient was in ICU. Eliquis  was stopped on 4/18 as cardiology determined patient was having frequent PACs instead of Afib. Amiodarone  was started on 4/19 to help suppress PACs. At this time cardiology plans to continue amiodarone  BID and transition to once daily on 4/27. Coreg  was also started on 4/19 to address both HF and PACs. Per telemetry on 4/22 her PAC burden has decreased. - Follow-up cardiology recs - Continue amiodarone  200 mg BID (last dose on 4/27) then once daily until 1 month of total amiodarone  therapy - Continue carvedilol  3.125 mg BID  - Follow-up outpatient cardiology appointment   #Lightheadedness, dizziness Patient fell on 4/21 due to dizziness upon getting up from bed, says she feels less dizzy today when going from lying down to sitting up. PT did not document  orthostatic vitals yesterday. She is now on GDMT for her HF which could be contributing to symptoms. She relatively normotensive during past 24 hours.  -fall precautions in place -PT/OT following   #Hypoglycemia, resolved #Poor p.o. intake, improving She has been tolerating a regular diet since SLP cleared her for normal diet  and signed off on 4/21. She has been eating small bits of food with no dysphagia, nausea or vomiting, just does not like the food here or the food her family brought her.   #Acute Hypoxic Respiratory Failure 2/2 encephalopathy, resolved Patient is saturating in high 90s on room air with no increased work of breathing, feels she is coughing less with the nebulizer treatments and Tessalon  pearls. -Continue on Brovana  and Pulmicort    Diet: Normal IVF: none VTE: SCDs Start: 05/25/23 1528, per nurse patient has been taking them off Code: Full PT/OT recs: SNF TOC recs: patient going to [facility] in [place], [place]. Family Update: will call brother Morgan Morgan  Dispo: Anticipated discharge to Skilled nursing facility in 1 day after 48 hours after completion of antibiotic treatment for meningitis.

## 2023-06-08 NOTE — Progress Notes (Signed)
 Occupational Therapy Treatment Patient Details Name: Morgan Morgan MRN: [id number] DOB: Jan 08, 1951 Today's Date: 06/08/2023   History of present illness 73 y.o. female presents to [facility] 05/25/23 after falling at home with AMS. Code stroke activated with no remarkable findings. 4/10 transfer to ICU. 4/10-4/17 intubated. Pt with meningoencephalitis, positive rhinovirus/enterovirus, L2-L3 osteomyelitis/discitis. PMHx: asthma, HTN   OT comments  Pt received in supine and agreeable to therapy. Pt completing bed mobility to come EOB with mod A for assistance with LE and trunk with use of bed features for additional assistance. Once EOB, pt able to tolerate static sitting with no physical assistance. Pt completing grooming while seated with set up A. Following grooming, pt completed 3 STS with mod Ax2, pt also able to take small side steps towards [facility] despite decreased standing tolerance. Pt returned to supine with min A for positioning and left with all needs met. Acute OT to continue to follow to address established goals to facilitate DC to next venue of care.        If plan is discharge home, recommend the following:  Two people to help with walking and/or transfers;Two people to help with bathing/dressing/bathroom;Assistance with cooking/housework;Assistance with feeding;Direct supervision/assist for medications management;Direct supervision/assist for financial management;Assist for transportation;Help with stairs or ramp for entrance;Supervision due to cognitive status   Equipment Recommendations  Wheelchair (measurements OT);Wheelchair cushion (measurements OT);BSC/3in1    Recommendations for Other Services      Precautions / Restrictions Precautions Precautions: Fall Recall of Precautions/Restrictions: Intact Restrictions Weight Bearing Restrictions Per Provider Order: No       Mobility Bed Mobility Overal bed mobility: Needs Assistance Bed Mobility: Supine to Sit, Sit to Supine      Supine to sit: Mod assist, HOB elevated, Used rails Sit to supine: Min assist   General bed mobility comments: increased time to move legs on/off EOB, min-mod assist for trunk lift and scooting forward    Transfers Overall transfer level: Needs assistance Equipment used: Rolling walker (2 wheels) Transfers: Sit to/from Stand Sit to Stand: From elevated surface, +2 physical assistance, Mod assist           General transfer comment: ModAx2 to stand from raised bed, trouble remaining upright requiring cues to look up and keep back straight     Balance Overall balance assessment: Needs assistance Sitting-balance support: Feet supported Sitting balance-Leahy Scale: Fair Sitting balance - Comments: EOB   Standing balance support: Bilateral upper extremity supported, During functional activity, Reliant on assistive device for balance Standing balance-Leahy Scale: Poor Standing balance comment: reliant on external support when standing although could not stand for extended time even with assistance                           ADL either performed or assessed with clinical judgement   ADL Overall ADL's : Needs assistance/impaired     Grooming: Wash/dry face;Oral care;Set up;Sitting Grooming Details (indicate cue type and reason): completed EOB with set up                               General ADL Comments: Declined changing socks, gown, and completing other self care while seated EOB    Extremity/Trunk Assessment              Vision       Perception     Praxis     Communication Communication Communication: No apparent  difficulties   Cognition Arousal: Alert Behavior During Therapy: WFL for tasks assessed/performed Cognition: Cognition impaired (appeared oriented but trouble following commands and appeared to have memory deficits)     Awareness: Intellectual awareness impaired, Online awareness impaired Memory impairment (select all  impairments): Short-term memory, Working [employer] fast streamer, Armed forces training and education officer functioning impairment (select all impairments): Problem solving OT - Cognition Comments: pt oriented and able to provide date and time, however, noted with memory impairments and asking multiple times about her teeth and clothes that were not in the room                 Following commands: Impaired Following commands impaired: Follows one step commands with increased time, Only follows one step commands consistently      Cueing   Cueing Techniques: Verbal cues, Gestural cues  Exercises      Shoulder Instructions       General Comments VSS on RA    Pertinent Vitals/ Pain       Pain Assessment Pain Assessment: No/denies pain Pain Intervention(s): Monitored during session  Home Living                                          Prior Functioning/Environment              Frequency  Min 2X/week        Progress Toward Goals  OT Goals(current goals can now be found in the care plan section)  Progress towards OT goals: Progressing toward goals  Acute Rehab OT Goals Patient Stated Goal: none stated this date OT Goal Formulation: Patient unable to participate in goal setting Time For Goal Achievement: 06/17/23 Potential to Achieve Goals: Fair ADL Goals Pt Will Perform Grooming: with min assist;sitting Pt Will Perform Upper Body Bathing: with min assist;sitting Pt Will Perform Upper Body Dressing: sitting;with min assist Pt Will Transfer to Toilet: with mod assist;ambulating;bedside commode Pt Will Perform Toileting - Clothing Manipulation and hygiene: with mod assist;sitting/lateral leans;sit to/from stand Additional ADL Goal #1: Patient will demonstrate ability to appropriately follow 1-step instructions in 8/10 trials for increased safety and independence with functional tasks.  Plan      Co-evaluation                 AM-PAC OT "6 Clicks" Daily  Activity     Outcome Measure   Help from another person eating meals?: A Little Help from another person taking care of personal grooming?: A Little Help from another person toileting, which includes using toliet, bedpan, or urinal?: Total Help from another person bathing (including washing, rinsing, drying)?: A Lot Help from another person to put on and taking off regular upper body clothing?: A Lot Help from another person to put on and taking off regular lower body clothing?: Total 6 Click Score: 12    End of Session Equipment Utilized During Treatment: Gait belt;Rolling walker (2 wheels)  OT Visit Diagnosis: Other abnormalities of gait and mobility (R26.89);Other symptoms and signs involving cognitive function   Activity Tolerance Patient tolerated treatment well   Patient Left in bed;with call bell/phone within reach   Nurse Communication Mobility status        Time: 1610-9604 OT Time Calculation (min): 29 min  Charges: OT General Charges $OT Visit: 1 Visit OT Treatments $Self Care/Home Management : 8-22 mins $Therapeutic Activity: 8-22 mins  Morgan Morgan,  BS, OTA/S   Morgan Morgan 06/08/2023, 10:57 AM

## 2023-06-08 NOTE — TOC Progression Note (Signed)
 Transition of Care [facility]) - Progression Note    Patient Details  Name: Morgan Morgan MRN: [id number] Date of Birth: 1950/09/02  [facility] CM/SW Contact  Carmon Christen, LCSWA Phone Number: 06/08/2023, 11:19 AM  Clinical Narrative:     CSW spoke with Maile Score with admission at [facility]. Maile Score informed CSW that  nursing is wanting to wait 48 hours after the completion of the antibiotics yesterday to make sure no more indications of meningitis . Facility can accept patient tomorrow if medically stable. CSW informed MD. CSW will continue to follow.  Expected Discharge Plan: Skilled Nursing Facility Barriers to Discharge: Continued Medical Work up  Expected Discharge Plan and Services   Discharge Planning Services: CM Consult   Living arrangements for the past 2 months: Single Family Home                                       Social Determinants of Health (SDOH) Interventions SDOH Screenings   Food Insecurity: Patient Unable To Answer (05/26/2023)  Housing: Patient Unable To Answer (05/26/2023)  Transportation Needs: Patient Unable To Answer (05/26/2023)  Utilities: Patient Unable To Answer (05/26/2023)  Social Connections: Patient Unable To Answer (05/26/2023)  Tobacco Use: Unknown (05/25/2023)    Readmission Risk Interventions     No data to display

## 2023-06-09 DIAGNOSIS — M4646 Discitis, unspecified, lumbar region: Secondary | ICD-10-CM | POA: Diagnosis not present

## 2023-06-09 DIAGNOSIS — I10 Essential (primary) hypertension: Secondary | ICD-10-CM | POA: Diagnosis not present

## 2023-06-09 LAB — BASIC METABOLIC PANEL WITH GFR
Anion gap: 8 (ref 5–15)
BUN: 10 mg/dL (ref 8–23)
CO2: 23 mmol/L (ref 22–32)
Calcium: 8.4 mg/dL — ABNORMAL LOW (ref 8.9–10.3)
Chloride: 107 mmol/L (ref 98–111)
Creatinine, Ser: 0.77 mg/dL (ref 0.44–1.00)
GFR, Estimated: >60 mL/min (ref >60)
Glucose, Bld: 98 mg/dL (ref 70–99)
Potassium: 4 mmol/L (ref 3.5–5.1)
Sodium: 138 mmol/L (ref 135–145)

## 2023-06-09 LAB — GLUCOSE, CAPILLARY
Glucose-Capillary: 78 mg/dL (ref 70–99)
Glucose-Capillary: 88 mg/dL (ref 70–99)
Glucose-Capillary: 79 mg/dL (ref 70–99)
Glucose-Capillary: 85 mg/dL (ref 70–99)

## 2023-06-09 MED ORDER — COSYNTROPIN 0.25 MG IJ SOLR
0.2500 mg | Freq: Once | INTRAMUSCULAR | Status: AC
  Administered 2023-06-10: 0.25 mg via INTRAVENOUS
  Filled 2023-06-09: qty 0.25

## 2023-06-09 NOTE — Progress Notes (Signed)
 Subjective:   Summary: : Morgan Morgan is a 73 y.o. with a pertinent PMH of asthma and hypertension who presented with AMS and was admitted for acute encephalopathy secondary to suspected bacterial meningitis and lumbar discitis on hospital day 15.  Overnight Events:   Morgan Morgan feels reports feeling good this morning, feels her shortness of breath and coughing are much improved. She reports feeling dizzy when the nurse stood her up to take orthostatic vitals today, she has not been allowed to ambulate around the room even with assistance due to her low BP with standing. She denies having any history of dizziness prior to her admission. She says she has been urinating a lot and is drinking lots of water /lemonade. She has not eaten too much because she doesn't like the food. She denies any orthopnea, chest pain, or palpitations. DOE cannot be assessed as patient is not ambulating at this time.    Objective:  Vital signs in last 24 hours: Vitals:   06/09/23 0817 06/09/23 0935 06/09/23 0940 06/09/23 1136  BP: 125/60   138/67  Pulse:    63  Resp: 19   18  Temp: 98.6 F (37 C)   98.2 F (36.8 C)  TempSrc: Oral   Oral  SpO2: 100% 94% 94% 96%  Weight:      Height:       Supplemental O2: Room Air SpO2: 96 % O2 Flow Rate (L/min): 0 L/min FiO2 (%): 21 %  Orthostatics:   Lying: 101/48, HR 72 Sitting: 96/55, HR 90 Standing at time 0: 62/38, HR 113    Physical Exam:   Constitutional:      Well-appearing, lying in bed in no acute distress HEENT:  Moist mucous membranes Cardiovascular:     Rate and Rhythm: Normal rate and regular rhythm.     Heart sounds: No murmur heard. No friction rub. No gallop. JVP hard to assess given body habitus  Pulmonary:  LCTA bilaterally, normal work of breathing on RA.  Abdominal:     General: Abdomen is flat, non-distended. Bowel sounds are normal.     Palpations: Abdomen is soft, non-tender to palpation in all quadrants.   Musculoskeletal:     No nuchal rigidity.  Skin:    General: Skin is warm and dry, no skin tenting, trace LE edema. Bandage covering wound on LLE Neurological:     General: No focal deficit present.     Mental Status: She is alert and oriented to person, place, and time.  Filed Weights   06/08/23 0432 06/09/23 0336 06/09/23 0426  Weight: 104.2 kg 104.9 kg 102.7 kg     Intake/Output Summary (Last 24 hours) at 06/09/2023 1416 Last data filed at 06/09/2023 0865 Gross per 24 hour  Intake 1746.11 ml  Output 400 ml  Net 1346.11 ml   Net IO Since Admission: 7,054.36 mL [06/09/23 1416]  Pertinent Labs:    Latest Ref Rng & Units 06/06/2023    6:08 AM 06/05/2023    1:30 PM 06/04/2023   11:36 AM  CBC  WBC 4.0 - 10.5 K/uL 9.0  9.8  12.2   Hemoglobin 12.0 - 15.0 g/dL 78.4  69.6  29.5   Hematocrit 36.0 - 46.0 % 31.1  32.6  34.4   Platelets 150 - 400 K/uL 365  383  432        Latest Ref Rng & Units 06/09/2023   12:30  PM 06/08/2023    5:59 AM 06/07/2023    6:43 AM  CMP  Glucose 70 - 99 mg/dL 98  89  85   BUN 8 - 23 mg/dL 10  12  12    Creatinine 0.44 - 1.00 mg/dL 4.09  8.11  9.14   Sodium 135 - 145 mmol/L 138  138  138   Potassium 3.5 - 5.1 mmol/L 4.0  3.4  3.4   Chloride 98 - 111 mmol/L 107  105  105   CO2 22 - 32 mmol/L 23  25  24    Calcium  8.9 - 10.3 mg/dL 8.4  8.1  8.2     Assessment/Plan:   Principal Problem:   Lumbar discitis Active Problems:   Hypertension   Altered mental status   Acute encephalopathy   Rhinovirus   Septic shock (HCC)   Osteomyelitis of lumbar spine (HCC)   On mechanically assisted ventilation (HCC)   Acute respiratory failure with hypoxia (HCC)   Diskitis   Bacterial meningitis   Acute systolic heart failure (HCC)   PAC (premature atrial contraction)   Meningitis   Patient Summary:  Morgan Morgan is a 73 y.o. female with asthma and hypertension who presented on 05/25/2023 with encephalopathy and was admitted with presumed severe bacterial  meningitis and lumbar osteomyelitis now complicated by non-ischemic cardiomyopathy on hospital day 15.    Plan:  #Meningitis with discitis #Acute encephalopathy, improving Continues to follow with ID. Remains afebrile w/ leukocytosis resolved at 9.0 on 4/21. Mental status continues to improve, patient feels ready for discharge to SNF. Per ID recommendations she will complete a 6 week long antibiotic course. She has completed the first 2 weeks of  2 g IV Rocephin  q12 and 750 mg IV vancomycin  q12 for CNS penetration and treatment of suspected bacterial meningitis. Yesterday she began a 4 week course of Rocephin  2 g daily and daptomycin  500 mg IV daily (4/23-5/20) which will address suspected L2-L3 discitis/osteomyelitis. PICC line was placed for continued IV antibiotic treatment when she discharges to a SNF. - Follow-up ID recs, ID appt scheduled for 5/19 with Dr. Ernie Heal - completed 2 weeks of IV vancomycin  750 mg every 12 hours and IV Rocephin  2 g every 12 hours on 4/22  - continue IV Dapto 500 mg every 24 hours and Rocephin  2 g every 24 hours today through 5/20 - she will need CK lab checked weekly while on Daptomycin    #Orthostatic hypotension #Lightheadedness, dizziness Patient fell on 4/21 due to dizziness upon getting up from bed. She received 1L of fluid last night due to MAP <65. Orthostatic hypotension of 62/38 today upon standing with compensatory tachycardia. Her supine blood pressures have been mostly normotensive but soft. She says she has been eating and drinking fine, nurse confirms this. She does endorse urinating more than usual. We held her spironolactone  and losartan  this AM given hypotension.  She does not appear hypovolemic on exam w/ moist mucous membranes, good skin turgor. Etiology of orthostatic hypotension is unclear at this time but our differential includes hypovolemia secondary to Spiro and Losartan  and their possible wash-out period. We have also ordered an ACTH stim test  to rule out hypocortisolism as cause for hypotension. Diabetes insipidus is possible given her recent encephalopathy but her BMP today was within normal limits. Infectious causes are less likely given she is on IV antibiotics. We will repeat orthostatics in the morning and plan to get her up into chair tomorrow before she discharges to SNF. We  will also reach out to cardiology prior to discharge regarding her medication regimen, as she was placed on these medications for GDMT in setting of new HFrEF.  -give 500 mL bolus of LR if MAP <65 -holding her Spiro 12.5 mg & Losartan  12.5 mg given hypotension, consider restarting Losartan  if BP improves -ACTH stim test in AM -repeat orthostatics tomorrow -Plan is to get patient to ambulate to chair before discharge -PT/OT following, did not see patient today  #Acute systolic heart failure, resolving HF likely in the setting of her acute encephalopathy, EF of 20-25% on 4/10. Repeat echo on 4/21 showed EF of 40-45%. Cardiology is following and began GDMT--regimen is currently Spironolactone  12.5 mg daily, Coreg  3.125 mg twice daily, and Losartan  12.5 mg. We are currently holding her Spiro and Losartan  given her hypotension. She does not appear volume overloaded on our exam today. Cardiology is aware and suggested restarting Losartan  if her BP improves. They have also d/c her PRN lasix .  We will reach out to cardiology prior to discharge regarding her medication regimen, as she was placed on these medications for GDMT in setting of new HFrEF.  -  Coreg  3.125 mg twice daily -  holding her Spiro 12.5 mg & Losartan  12.5 mg given hypotension, consider restarting Losartan  if BP improves - Follow-up cardiology appointment on 06/22/2023 w/ Morgan Bobo, NP   #Atrial fibrillation versus frequent PACs Concern for Afib when patient was in ICU. Eliquis  was stopped on 4/18 as cardiology determined patient was having frequent PACs instead of Afib. Amiodarone  was started on  4/19 to help suppress PACs. At this time cardiology plans to continue amiodarone  BID and transition to once daily on 4/27. Coreg  was also started on 4/19 to address both HF and PACs. Per telemetry on 4/22 her PAC burden has decreased. - Follow-up cardiology recs - Continue amiodarone  200 mg BID (last dose on 4/27) then once daily until 1 month of total amiodarone  therapy - Continue carvedilol  3.125 mg BID  - Follow-up cardiology appointment on 06/22/2023 w/ Morgan Bobo, NP  #Hypoglycemia, resolved #Poor p.o. intake, improving She has been tolerating a regular diet since SLP cleared her for normal diet and signed off on 4/21. She has been eating small bits of food with no dysphagia, nausea or vomiting, just does not like the food here or the food her family brought her. CBG this morning was 78.   #Acute Hypoxic Respiratory Failure 2/2 encephalopathy, resolved Patient is saturating in high 90s on room air with no increased work of breathing, feels she is coughing less with the nebulizer treatments and Tessalon  pearls. -Continue on Brovana  and Pulmicort    Diet: Normal IVF: none VTE: SCDs Start: 05/25/23 1528, per nurse patient has been taking them off Code: Full PT/OT recs: SNF, Rolling walker  TOC recs: patient going to [facility] in [place], [place]. Family Update: will call brother Cornel Diesel  Dispo: Anticipated discharge to Skilled nursing facility in 1 day after 48 hours after completion of antibiotic treatment for meningitis.

## 2023-06-09 NOTE — Progress Notes (Signed)
 Physical Therapy Treatment Patient Details Name: Morgan Morgan MRN: [id number] DOB: Sep 09, 1950 Today's Date: 06/09/2023   History of Present Illness 73 y.o. female presents to [facility] 05/25/23 after falling at home with AMS. Code stroke activated with no remarkable findings. 4/10 transfer to ICU. 4/10-4/17 intubated. Pt with meningoencephalitis, positive rhinovirus/enterovirus, L2-L3 osteomyelitis/discitis. PMHx: asthma, HTN    PT Comments  Pt on BSC on entry, requires modA for coming to standing for pericare and modAx2 for pivot transfer back to bed. Pt frustrated with poor safety awareness, about not being able to get up and walk around room due to symptomatic BP. D/c plan remains appropriate at this time. PT will continue to follow acutely.    If plan is discharge home, recommend the following: Assistance with cooking/housework;Assist for transportation;Help with stairs or ramp for entrance;Two people to help with walking and/or transfers;Two people to help with bathing/dressing/bathroom   Can travel by private vehicle     No  Equipment Recommendations  Rolling walker (2 wheels);BSC/3in1;Wheelchair (measurements PT);Wheelchair cushion (measurements PT);Hospital bed;Hoyer lift       Precautions / Restrictions Precautions Precautions: Fall Recall of Precautions/Restrictions: Impaired     Mobility  Bed Mobility Overal bed mobility: Needs Assistance         Sit to supine: Min assist   General bed mobility comments: min A for bringing LE into bed.    Transfers Overall transfer level: Needs assistance   Transfers: Bed to chair/wheelchair/BSC Sit to Stand: Mod assist     Squat pivot transfers: Mod assist, +2 physical assistance     General transfer comment: modA for elevation of hips off BSC for pericare, modAx2 for squat pivot from BSC back to bed        Balance Overall balance assessment: Needs assistance Sitting-balance support: Feet supported Sitting balance-Leahy  Scale: Fair Sitting balance - Comments: BSC   Standing balance support: Bilateral upper extremity supported Standing balance-Leahy Scale: Poor Standing balance comment: reliant on external support when standing although could not stand for extended time even with assistance                            Communication Communication Communication: No apparent difficulties  Cognition Arousal: Alert Behavior During Therapy: Impulsive, Restless   PT - Cognitive impairments: No family/caregiver present to determine baseline, Orientation, Memory, Awareness, Sequencing, Problem solving, Safety/Judgement                       PT - Cognition Comments: pt with decreased safety awareness, reaching to the floor from [facility] after dropping item, nearly falling out of [facility] Following commands: Impaired Following commands impaired: Only follows one step commands consistently, Follows one step commands inconsistently    Cueing Cueing Techniques: Verbal cues, Gestural cues, Tactile cues, Visual cues     General Comments  Pt with poor insight into her safety      Pertinent Vitals/Pain Pain Assessment Pain Assessment: No/denies pain Faces Pain Scale: No hurt     PT Goals (current goals can now be found in the care plan section) Acute Rehab PT Goals PT Goal Formulation: With patient Time For Goal Achievement: 06/17/23 Potential to Achieve Goals: Fair Progress towards PT goals: Progressing toward goals    Frequency    Min 2X/week       AM-PAC PT "6 Clicks" Mobility   Outcome Measure  Help needed turning from your back to your side while in a flat  bed without using bedrails?: A Lot Help needed moving from lying on your back to sitting on the side of a flat bed without using bedrails?: A Lot Help needed moving to and from a bed to a chair (including a wheelchair)?: Total Help needed standing up from a chair using your arms (e.g., wheelchair or bedside chair)?: Total Help  needed to walk in hospital room?: Total Help needed climbing 3-5 steps with a railing? : Total 6 Click Score: 8    End of Session Equipment Utilized During Treatment: Gait belt Activity Tolerance: Patient tolerated treatment well Patient left: in bed;with call bell/phone within reach;with bed alarm set;with nursing/sitter in room   PT Visit Diagnosis: Other abnormalities of gait and mobility (R26.89);Muscle weakness (generalized) (M62.81);History of falling (Z91.81)     Time: 7829-5621 PT Time Calculation (min) (ACUTE ONLY): 13 min  Charges:    $Therapeutic Activity: 8-22 mins PT General Charges $$ ACUTE PT VISIT: 1 Visit                     Janicia Monterrosa B. Jewel Mortimer PT, DPT Acute Rehabilitation Services Please use secure chat or  Call Office [phone]    Verlie Glisson [facility] 06/09/2023, 4:36 PM

## 2023-06-09 NOTE — Plan of Care (Signed)
  Problem: Clinical Measurements: Goal: Ability to maintain clinical measurements within normal limits will improve Outcome: Progressing   Problem: Coping: Goal: Level of anxiety will decrease Outcome: Progressing   Problem: Elimination: Goal: Will not experience complications related to bowel motility Outcome: Progressing Goal: Will not experience complications related to urinary retention Outcome: Progressing   Problem: Safety: Goal: Ability to remain free from injury will improve Outcome: Progressing   Problem: Skin Integrity: Goal: Risk for impaired skin integrity will decrease Outcome: Progressing   Problem: Fluid Volume: Goal: Ability to maintain a balanced intake and output will improve Outcome: Progressing   Problem: Metabolic: Goal: Ability to maintain appropriate glucose levels will improve Outcome: Progressing

## 2023-06-09 NOTE — Care Management Important Message (Signed)
 Important Message  Patient Details  Name: Morgan Morgan MRN: [id number] Date of Birth: 09/15/1950   Important Message Given:  Yes - Medicare IM     Janith Melnick 06/09/2023, 11:38 AM

## 2023-06-09 NOTE — TOC Progression Note (Signed)
 Transition of Care [facility]) - Progression Note    Patient Details  Name: Morgan Morgan MRN: [id number] Date of Birth: January 22, 1951  Transition of Care [facility]) CM/SW Contact  Carmon Christen, LCSWA Phone Number: 06/09/2023, 12:35 PM  Clinical Narrative:     Patient has SNF bed at [facility] when medically stable for DC. Insurance authorization approved through 4/25. CSW will continue to follow.  Expected Discharge Plan: Skilled Nursing Facility Barriers to Discharge: Continued Medical Work up  Expected Discharge Plan and Services   Discharge Planning Services: CM Consult   Living arrangements for the past 2 months: Single Family Home                                       Social Determinants of Health (SDOH) Interventions SDOH Screenings   Food Insecurity: Patient Unable To Answer (05/26/2023)  Housing: Patient Unable To Answer (05/26/2023)  Transportation Needs: Patient Unable To Answer (05/26/2023)  Utilities: Patient Unable To Answer (05/26/2023)  Social Connections: Patient Unable To Answer (05/26/2023)  Tobacco Use: Unknown (05/25/2023)    Readmission Risk Interventions     No data to display

## 2023-06-10 DIAGNOSIS — J9601 Acute respiratory failure with hypoxia: Secondary | ICD-10-CM | POA: Diagnosis not present

## 2023-06-10 DIAGNOSIS — I5021 Acute systolic (congestive) heart failure: Secondary | ICD-10-CM | POA: Diagnosis not present

## 2023-06-10 DIAGNOSIS — M4646 Discitis, unspecified, lumbar region: Secondary | ICD-10-CM | POA: Diagnosis not present

## 2023-06-10 DIAGNOSIS — G9340 Encephalopathy, unspecified: Secondary | ICD-10-CM | POA: Diagnosis not present

## 2023-06-10 LAB — GLUCOSE, CAPILLARY
Glucose-Capillary: 85 mg/dL (ref 70–99)
Glucose-Capillary: 88 mg/dL (ref 70–99)
Glucose-Capillary: 121 mg/dL — ABNORMAL HIGH (ref 70–99)

## 2023-06-10 LAB — ACTH STIMULATION, 3 TIME POINTS
Cortisol, 30 Min: 16.7 ug/dL
Cortisol, 60 Min: 17.8 ug/dL
Cortisol, Base: 9.1 ug/dL

## 2023-06-10 MED ORDER — DAPTOMYCIN-SODIUM CHLORIDE 500-0.9 MG/50ML-% IV SOLN: 500.0000 mg | Freq: Every day | INTRAVENOUS | Status: DC

## 2023-06-10 MED ORDER — POLYETHYLENE GLYCOL 3350 17 G PO PACK
17.0000 g | PACK | Freq: Every day | ORAL | 0 refills | Status: AC | PRN
Start: 2023-06-10 — End: ?

## 2023-06-10 MED ORDER — AMIODARONE HCL 200 MG PO TABS: ORAL_TABLET | ORAL | Status: DC

## 2023-06-10 MED ORDER — LIDOCAINE 5 % EX PTCH: 1.0000 | MEDICATED_PATCH | CUTANEOUS | 0 refills | Status: AC

## 2023-06-10 MED ORDER — BENZONATATE 100 MG PO CAPS: 100.0000 mg | ORAL_CAPSULE | Freq: Three times a day (TID) | ORAL | 0 refills | Status: AC | PRN

## 2023-06-10 MED ORDER — ARFORMOTEROL TARTRATE 15 MCG/2ML IN NEBU: 15.0000 ug | INHALATION_SOLUTION | Freq: Two times a day (BID) | RESPIRATORY_TRACT | Status: AC

## 2023-06-10 MED ORDER — CARVEDILOL 3.125 MG PO TABS
3.1250 mg | ORAL_TABLET | Freq: Two times a day (BID) | ORAL | Status: DC
Start: 2023-06-10 — End: 2023-10-07

## 2023-06-10 MED ORDER — SODIUM CHLORIDE 0.9 % IV SOLN: 2.0000 g | INTRAVENOUS | Status: DC

## 2023-06-10 MED ORDER — ACETAMINOPHEN 325 MG PO TABS: 650.0000 mg | ORAL_TABLET | Freq: Four times a day (QID) | ORAL | Status: AC | PRN

## 2023-06-10 MED ORDER — ENSURE ENLIVE PO LIQD: 237.0000 mL | Freq: Two times a day (BID) | ORAL | 12 refills | Status: AC

## 2023-06-10 MED ORDER — BUDESONIDE 0.25 MG/2ML IN SUSP: 0.2500 mg | Freq: Two times a day (BID) | RESPIRATORY_TRACT | 12 refills | Status: AC

## 2023-06-10 NOTE — TOC Transition Note (Signed)
 Transition of Care [facility]) - Discharge Note   Patient Details  Name: Morgan Morgan MRN: [id number] Date of Birth: 1950-12-19  Transition of Care [facility]) CM/SW Contact:  Juliane Och, LCSW Phone Number: 06/10/2023, 2:45 PM   Clinical Narrative:     Patient will DC to: [facility] SNF Anticipated DC date: 06/10/2023 Family notified: Aletta Andreas; Brother; [phone] Transport by: Lyna Sandhoff   Per MD patient ready for DC to [facility]. RN to call report prior to discharge [phone]). RN, patient, patient's family, and facility notified of DC. Discharge Summary and FL2 sent to facility. DC packet on chart. Ambulance transport requested for patient at 14:45.  CSW will sign off for now as social work intervention is no longer needed. Please consult us  again if new needs arise.    Final next level of care: Skilled Nursing Facility Barriers to Discharge: Barriers Resolved   Patient Goals and CMS Choice Patient states their goals for this hospitalization and ongoing recovery are:: SNF          Discharge Placement              Patient chooses bed at: [facility] Patient to be transferred to facility by: [facility] Name of family member notified: Aletta Andreas; Brother; [phone] Patient and family notified of of transfer: 06/10/23  Discharge Plan and Services Additional resources added to the After Visit Summary for     Discharge Planning Services: CM Consult                                 Social Drivers of Health (SDOH) Interventions SDOH Screenings   Food Insecurity: Patient Unable To Answer (05/26/2023)  Housing: Patient Unable To Answer (05/26/2023)  Transportation Needs: Patient Unable To Answer (05/26/2023)  Utilities: Patient Unable To Answer (05/26/2023)  Social Connections: Patient Unable To Answer (05/26/2023)  Tobacco Use: Unknown (05/25/2023)     Readmission Risk Interventions     No data to display

## 2023-06-10 NOTE — Discharge Instructions (Addendum)
 Morgan Morgan,  It was a pleasure taking care of you during your hospital stay. You were hospitalized for a likely bacterial infection in your brain. You were started on a 6 week course of antibiotics and have since improved greatly. You will continue receiving these antibiotics through the line in your right arm until May 20th.  - follow up appointment with Dr. Ernie Heal at [facility] on 07/04/2023 at 2:15 PM   During your illness, your heart had difficulty pumping blood to the rest of your body. As you have gotten better, your heart function has also improved. You were started on medications by a cardiologist to help your heart. We have stopped these due to your blood pressure being too low in the past few days. You will have a follow-up appointment with a cardiologist so you can discuss restarting these medications. - follow-up appointment with Palmer Bobo, NP at [facility] at [facility] on 06/22/2023 at 8:50 AM

## 2023-06-10 NOTE — Plan of Care (Signed)
  Problem: Clinical Measurements: Goal: Respiratory complications will improve Outcome: Progressing Goal: Cardiovascular complication will be avoided Outcome: Progressing   Problem: Activity: Goal: Risk for activity intolerance will decrease Outcome: Progressing   Problem: Nutrition: Goal: Adequate nutrition will be maintained Outcome: Progressing   Problem: Pain Managment: Goal: General experience of comfort will improve and/or be controlled Outcome: Progressing   Problem: Safety: Goal: Ability to remain free from injury will improve Outcome: Progressing   Problem: Metabolic: Goal: Ability to maintain appropriate glucose levels will improve Outcome: Progressing   Problem: Nutritional: Goal: Maintenance of adequate nutrition will improve Outcome: Progressing

## 2023-06-22 ENCOUNTER — Encounter: Payer: Self-pay | Admitting: Emergency Medicine

## 2023-06-22 ENCOUNTER — Ambulatory Visit: Attending: Emergency Medicine | Admitting: Emergency Medicine

## 2023-06-22 VITALS — BP 130/80 | HR 65 | Ht 63.0 in | Wt 212.0 lb

## 2023-06-22 DIAGNOSIS — I491 Atrial premature depolarization: Secondary | ICD-10-CM

## 2023-06-22 DIAGNOSIS — I5021 Acute systolic (congestive) heart failure: Secondary | ICD-10-CM | POA: Diagnosis not present

## 2023-06-22 DIAGNOSIS — I159 Secondary hypertension, unspecified: Secondary | ICD-10-CM

## 2023-06-22 DIAGNOSIS — G039 Meningitis, unspecified: Secondary | ICD-10-CM

## 2023-06-22 MED ORDER — LOSARTAN POTASSIUM 25 MG PO TABS: 12.5000 mg | ORAL_TABLET | Freq: Every day | ORAL | 2 refills | Status: DC

## 2023-06-22 NOTE — Patient Instructions (Addendum)
 Medication Instructions:  STOP TAKING AMIODARONE  ON Jul 03, 2023. START LOSARTAN  12.5 MG DAILY TODAY.   Lab Work: BMET TO BE DONE TODAY THEN AGAIN IN 2 WEEKS.   Testing/Procedures: NONE  Follow-Up: At [facility], you and your health needs are our priority.  As part of our continuing mission to provide you with exceptional heart care, our providers are all part of one team.  This team includes your primary Cardiologist (physician) and Advanced Practice Providers or APPs (Physician Assistants and Nurse Practitioners) who all work together to provide you with the care you need, when you need it.  Your next appointment:   1 MONTH.   Provider:   MADISON FOUNTAIN, DNP

## 2023-06-22 NOTE — Progress Notes (Signed)
 Cardiology Office Note:    Date:  06/22/2023  ID:  Morgan Morgan, DOB [birth year], MRN [id number] PCP: Ezell Hollow, NP  [facility] Providers Cardiologist:  Oneil Bigness, MD       Patient Profile:      Chief Complaint: Hospital follow-up for systolic heart failure History of Present Illness:  Morgan Morgan is a 73 y.o. female with visit-pertinent history of acute systolic heart failure, sinus rhythm with PACs  She presented to the ED on 4 10/2023 with altered mental status after being found down by her grandson.  CTA, CT angio, MRI brain negative.  EKG showed A-fib with RVR 116 bpm and started on amnio.  Chest x-ray with cardiomegaly.  She developed fever started on IV antibiotics.  Continue to decline requiring the patient pressure initiation for hypotension.  There was concern for discitis/meningitis.  Treating empirically.  Echocardiogram performed showing newly reduced LVEF 20 to 25%, global hypokinesis.  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  EEG showed severe diffuse encephalopathy.  Cardiology evaluated in ICU and point-of-care ultrasound showed LVEF 40 to 45%, EF reduction thought to be related to critical illness.  Cardiology noted initial rhythm was thought to be atrial fibrillation was likely sinus rhythm with PACs.  Eliquis  has been stopped.  Limited echocardiogram showed LVEF 40 to 45%, normal RV systolic function.  She was discharged on Lasix  20 mg as needed, carvedilol  3.125 mg twice daily, losartan  12.5 mg daily, spironolactone  12 5 mg daily.  She did develop some hypertension and her spironolactone  and losartan  were held at discharge.   Discussed the use of AI scribe software for clinical note transcription with the patient, who gave verbal consent to proceed.  History of Present Illness Morgan Morgan is a 73 year old female who presents for follow-up for acute systolic heart failure.  Patient arrives to clinic today noting that she is  currently living in a rehabilitation facility.  She notes that she feels well overall and much improved.  She is without any acute cardiovascular concerns or complaints today.  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  She denies any symptoms concerning for acute heart failure exacerbation.  She denies any DOE, SOB, orthopnea, PND, lower extremity swelling.  She is without any chest pain or anginal symptoms.  She denies any symptoms concerning for recurrent PVCs or atrial fibrillation.   Review of systems:  Please see the history of present illness. All other systems are reviewed and otherwise negative.     Home Medications:    Current Meds  Medication Sig   albuterol  (VENTOLIN  HFA) 108 (90 Base) MCG/ACT inhaler Inhale into the lungs every 6 (six) hours as needed for wheezing or shortness of breath.   arformoterol  (BROVANA ) 15 MCG/2ML NEBU Take 2 mLs (15 mcg total) by nebulization 2 (two) times daily.   carvedilol  (COREG ) 3.125 MG tablet Take 1 tablet (3.125 mg total) by mouth 2 (two) times daily with a meal.   D3 SUPER STRENGTH 50 MCG (2000 UT) CAPS Take 1 capsule by mouth daily.   losartan  (COZAAR ) 25 MG tablet Take 0.5 tablets (12.5 mg total) by mouth daily.   mometasone -formoterol  (DULERA ) 100-5 MCG/ACT AERO Inhale 2 puffs into the lungs 2 (two) times daily.   Studies Reviewed:   EKG Interpretation Date/Time:  Wednesday Jun 22 2023 09:16:07 EDT Ventricular Rate:  57 PR Interval:  178 QRS Duration:  94 QT Interval:  478  QTC Calculation: 465 R Axis:   -1  Text Interpretation: Sinus bradycardia Minimal voltage criteria for LVH, may be normal variant ( R in aVL ) Cannot rule out Anterior infarct , age undetermined When compared with ECG of 25-May-2023 23:40, PREVIOUS ECG IS PRESENT Confirmed by Palmer Bobo [id number]) on 06/22/2023 12:57:43 PM    Echocardiogram Limited 06/06/2023 1. Left ventricular ejection fraction, by  estimation, is 40 to 45%. The  left ventricle has mildly decreased function. The left ventricle  demonstrates global hypokinesis.   2. Right ventricular systolic function is normal. The right ventricular  size is normal.   3. Left atrial size was severely dilated.   4. Right atrial size was mildly dilated.   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   6. The aortic valve is tricuspid. There is moderate calcification of the  aortic valve.   7. The inferior vena cava is normal in size with greater than 50%  respiratory variability, suggesting right atrial pressure of 3 mmHg.   Echocardiogram 05/26/2023  1. Left ventricular ejection fraction, by estimation, is 20 to 25%. The  left ventricle has severely decreased function. The left ventricle  demonstrates global hypokinesis. There is mild left ventricular  hypertrophy. Left ventricular diastolic parameters   are indeterminate.   2. Right ventricular systolic function is normal. The right ventricular  size is normal. There is normal pulmonary artery systolic pressure.   3. Left atrial size was severely dilated.   4. The mitral valve is myxomatous. Trivial mitral valve regurgitation. No  evidence of mitral stenosis.   5. The aortic valve is calcified. Aortic valve regurgitation is not  visualized. Aortic valve sclerosis/calcification is present, without any  evidence of aortic stenosis.   6. The inferior vena cava is normal in size with greater than 50%  respiratory variability, suggesting right atrial pressure of 3 mmHg.   Risk Assessment/Calculations:             Physical Exam:   VS:  BP 130/80   Pulse 65   Ht 5\' 3"  (1.6 m)   Wt 212 lb (96.2 kg)   SpO2 98%   BMI 37.55 kg/m    Wt Readings from Last 3 Encounters:  06/22/23 212 lb (96.2 kg)  06/10/23 231 lb 11.3 oz (105.1 kg)  11/19/21 240 lb (108.9 kg)    GEN: Well nourished, well developed in no acute distress NECK: No JVD; No carotid bruits CARDIAC: RRR, no murmurs, rubs,  gallops RESPIRATORY:  Clear to auscultation without rales, wheezing or rhonchi  ABDOMEN: Soft, non-tender, non-distended EXTREMITIES:  No edema; No acute deformity     Assessment and Plan:  Acute systolic heart failure Likely caused by acute illness by presumed meningitis and discitis  Echocardiogram 4/10 (in the setting of meningitis) showed LVEF 20-25%, global hypokinesis, normal RV systolic function, normal PA systolic pressure Limited echocardiogram 4/21 showed improved LVEF 40-45%, normal RV systolic function Started on GDMT during admission however spironolactone  and losartan  were held due to hypotension.  Carvedilol  was continued. - Today patient is euvolemic and well compensated on exam.  NYHA class II symptoms - Blood pressure is 130/80.  No hypotensive readings or orthostatic hypotension at rehabilitation center - Start losartan  25 mg daily in order to optimize GDMT - Continue carvedilol  3.125 mg twice daily -There was concern for hypoglycemia during recent admission.  Will order BMET today and consider adding SGLT2i if glucose is normal - Will repeat echocardiogram once GDMT has been optimized for total of  3 months - BMET today and in 2 weeks  Frequent PACs There was a concern for Afib when patient was in the ICU. Eliquis  was stopped on 4/18 as cardiology determined patient was having frequent PACs instead of Afib. Amiodarone  was started on 4/19 to help suppress PACs - EKG today shows sinus bradycardia.  No PACs noted - Will plan to discontinue amiodarone  on 5/19 (1 month of therapy) as there is no clear atrial fibrillation noted during admission  - Can consider ZIO in future to assess burden if patient becomes symptomatic or PACs are noted to become frequent - Continue carvedilol  3.125 mg twice daily - Discontinue amiodarone  200 mg daily on 5/19  Meningitis with discitis Acute encephalopathy -Follow-up appointment on 5/19 with infectious disease      Dispo:  Return in  about 1 month (around 07/23/2023).  Signed, Ava Boatman, NP

## 2023-07-02 NOTE — Progress Notes (Addendum)
 Subjective:  Chief complaint: follow-up for meningitis, diskitis    Patient ID: Morgan Morgan, female    DOB: Nov 23, 1950, 73 y.o.   MRN: [id number]  HPI  Discussed the use of AI scribe software for clinical note transcription with the patient, who gave verbal consent to proceed.  History of Present Illness   Morgan Morgan is a 73 year old female who presents for follow-up after hospitalization for meningitis and discitis.  During her recent hospitalization, an MRI of the brain indicated inflammation consistent with meningitis, and she was treated with antibiotics. She was also diagnosed with discitis via lumbar spine MRI and has been receiving antibiotics through a PICC line, which is scheduled for removal after her final dose tomorrow. She currently experiences no back pain.  She continues to have headaches similar to past migraines, located behind her eyes. Tylenol  has been effective in alleviating the pain. She has a history of migraines diagnosed in 2020 but has not experienced them since relocating. She associates her headaches with sinusitis and bronchial asthma. She does not recall having headaches prior to her recent hospitalization/   Of note when she was admitted she had sepsis and I suspect was bacteremic though blood cultures were taken after antibiotics had started.         Past Medical History:  Diagnosis Date   Asthma    Hypertension     Past Surgical History:  Procedure Laterality Date   ABDOMINAL HYSTERECTOMY     IR LUMBAR PUNCTURE  05/28/2023    Family History  Problem Relation Age of Onset   Asthma Mother    Asthma Brother    Asthma Son    Colon cancer Neg Hx    Colon polyps Neg Hx       Social History   Socioeconomic History   Marital status: Widowed    Spouse name: Not on file   Number of children: Not on file   Years of education: Not on file   Highest education level: Not on file  Occupational History   Not on file  Tobacco Use   Smoking  status: Never   Smokeless tobacco: Not on file  Vaping Use   Vaping status: Never Used  Substance and Sexual Activity   Alcohol use: No   Drug use: No   Sexual activity: Yes  Other Topics Concern   Not on file  Social History Narrative   Not on file   Social Drivers of Health   Financial Resource Strain: Not on file  Food Insecurity: Patient Unable To Answer (05/26/2023)   Hunger Vital Sign    Worried About Running Out of Food in the Last Year: Patient unable to answer    Ran Out of Food in the Last Year: Patient unable to answer  Transportation Needs: Patient Unable To Answer (05/26/2023)   PRAPARE - Transportation    Lack of Transportation (Medical): Patient unable to answer    Lack of Transportation (Non-Medical): Patient unable to answer  Physical Activity: Not on file  Stress: Not on file  Social Connections: Patient Unable To Answer (05/26/2023)   Social Connection and Isolation Panel [NHANES]    Frequency of Communication with Friends and Family: Patient unable to answer    Frequency of Social Gatherings with Friends and Family: Patient unable to answer    Attends Religious Services: Patient unable to answer    Active Member of Clubs or Organizations: Patient unable to answer    Attends Banker Meetings:  Patient unable to answer    Marital Status: Patient unable to answer    Allergies  Allergen Reactions   Zithromax [Azithromycin] Itching     Current Outpatient Medications:    acetaminophen  (TYLENOL ) 325 MG tablet, Take 2 tablets (650 mg total) by mouth every 6 (six) hours as needed for headache or fever. (Patient not taking: Reported on 06/22/2023), Disp: , Rfl:    albuterol  (VENTOLIN  HFA) 108 (90 Base) MCG/ACT inhaler, Inhale into the lungs every 6 (six) hours as needed for wheezing or shortness of breath., Disp: , Rfl:    amiodarone  (PACERONE ) 200 MG tablet, Take 1 tablet (200 mg total) by mouth 2 (two) times daily for 2 days, THEN 1 tablet (200 mg  total) daily. (Patient not taking: Reported on 06/22/2023), Disp: , Rfl:    arformoterol  (BROVANA ) 15 MCG/2ML NEBU, Take 2 mLs (15 mcg total) by nebulization 2 (two) times daily., Disp: , Rfl:    benzonatate  (TESSALON ) 100 MG capsule, Take 1 capsule (100 mg total) by mouth 3 (three) times daily as needed for cough. (Patient not taking: Reported on 06/22/2023), Disp: 20 capsule, Rfl: 0   budesonide  (PULMICORT ) 0.25 MG/2ML nebulizer solution, Take 2 mLs (0.25 mg total) by nebulization 2 (two) times daily. (Patient not taking: Reported on 06/22/2023), Disp: 60 mL, Rfl: 12   carvedilol  (COREG ) 3.125 MG tablet, Take 1 tablet (3.125 mg total) by mouth 2 (two) times daily with a meal., Disp: , Rfl:    cefTRIAXone  2 g in sodium chloride  0.9 % 100 mL, Inject 2 g into the vein daily. (Patient not taking: Reported on 06/22/2023), Disp: , Rfl:    D3 SUPER STRENGTH 50 MCG (2000 UT) CAPS, Take 1 capsule by mouth daily., Disp: , Rfl:    daptomycin  (CUBICIN ) 500-0.9 MG/50ML-% SOLN, Inject 50 mLs (500 mg total) into the vein daily at 2 PM. (Patient not taking: Reported on 06/22/2023), Disp: , Rfl:    feeding supplement (ENSURE ENLIVE / ENSURE PLUS) LIQD, Take 237 mLs by mouth 2 (two) times daily between meals. (Patient not taking: Reported on 06/22/2023), Disp: 237 mL, Rfl: 12   lidocaine  (LIDODERM ) 5 %, Place 1 patch onto the skin daily. Remove & Discard patch within 12 hours or as directed by MD (Patient not taking: Reported on 06/22/2023), Disp: 30 patch, Rfl: 0   losartan  (COZAAR ) 25 MG tablet, Take 0.5 tablets (12.5 mg total) by mouth daily., Disp: 45 tablet, Rfl: 2   mometasone -formoterol  (DULERA ) 100-5 MCG/ACT AERO, Inhale 2 puffs into the lungs 2 (two) times daily., Disp: 1 Inhaler, Rfl: 0   polyethylene glycol (MIRALAX  / GLYCOLAX ) 17 g packet, Take 17 g by mouth daily as needed for moderate constipation. (Patient not taking: Reported on 06/22/2023), Disp: 14 each, Rfl: 0   Review of Systems  Constitutional:  Negative for  activity change, appetite change, chills, diaphoresis, fatigue, fever and unexpected weight change.  HENT:  Negative for congestion, rhinorrhea, sinus pressure, sneezing, sore throat and trouble swallowing.   Eyes:  Negative for photophobia and visual disturbance.  Respiratory:  Negative for cough, chest tightness, shortness of breath, wheezing and stridor.   Cardiovascular:  Negative for chest pain, palpitations and leg swelling.  Gastrointestinal:  Negative for abdominal distention, abdominal pain, anal bleeding, blood in stool, constipation, diarrhea, nausea and vomiting.  Genitourinary:  Negative for difficulty urinating, dysuria, flank pain and hematuria.  Musculoskeletal:  Negative for arthralgias, back pain, gait problem, joint swelling and myalgias.  Skin:  Negative for color change,  pallor, rash and wound.  Neurological:  Positive for headaches. Negative for dizziness, tremors, weakness and light-headedness.  Hematological:  Negative for adenopathy. Does not bruise/bleed easily.  Psychiatric/Behavioral:  Negative for agitation, behavioral problems, confusion, decreased concentration, dysphoric mood and sleep disturbance.        Objective:   Physical Exam Constitutional:      General: She is not in acute distress.    Appearance: Normal appearance. She is well-developed. She is not ill-appearing or diaphoretic.  HENT:     Head: Normocephalic and atraumatic.     Right Ear: Hearing and external ear normal.     Left Ear: Hearing and external ear normal.     Nose: No nasal deformity or rhinorrhea.  Eyes:     General: No scleral icterus.    Conjunctiva/sclera: Conjunctivae normal.     Right eye: Right conjunctiva is not injected.     Left eye: Left conjunctiva is not injected.     Pupils: Pupils are equal, round, and reactive to light.  Neck:     Vascular: No JVD.  Cardiovascular:     Rate and Rhythm: Normal rate and regular rhythm.     Heart sounds: S1 normal and S2 normal.   Pulmonary:     Effort: Pulmonary effort is normal. No respiratory distress.     Breath sounds: No wheezing.  Abdominal:     General: Bowel sounds are normal. There is no distension.     Palpations: Abdomen is soft.     Tenderness: There is no abdominal tenderness.  Musculoskeletal:        General: Normal range of motion.     Right shoulder: Normal.     Left shoulder: Normal.     Cervical back: Normal range of motion and neck supple.     Right hip: Normal.     Left hip: Normal.     Right knee: Normal.     Left knee: Normal.  Lymphadenopathy:     Head:     Right side of head: No submandibular, preauricular or posterior auricular adenopathy.     Left side of head: No submandibular, preauricular or posterior auricular adenopathy.     Cervical: No cervical adenopathy.     Right cervical: No superficial or deep cervical adenopathy.    Left cervical: No superficial or deep cervical adenopathy.  Skin:    General: Skin is warm and dry.     Coloration: Skin is not pale.     Findings: No abrasion, bruising, ecchymosis, erythema, lesion or rash.     Nails: There is no clubbing.  Neurological:     Mental Status: She is alert and oriented to person, place, and time.     Sensory: No sensory deficit.     Coordination: Coordination normal.     Gait: Gait normal.  Psychiatric:        Attention and Perception: She is attentive.        Mood and Affect: Mood normal.        Speech: Speech normal.        Behavior: Behavior normal. Behavior is cooperative.        Thought Content: Thought content normal.        Judgment: Judgment normal.   PICC line:          Assessment & Plan:    Assessment and Plan    Meningoencephalitis Hospitalized with severe illness, confirmed by MRI. Treated with antibiotics, showing significant improvement. No expectation of recurrence  due to absence of predisposing factors. - Complete antibiotic course tomorrow. - Remove PICC line after the last dose of  antibiotics. - Encouraged vaccinations  Discitis Discitis in lumbar spine resolved with treatment, no current back pain.  Headache Ongoing headaches likely due to sinusitis or migraines, less severe with Tylenol . Unlikely related to meningitis but that being said I will  - Schedule follow-up appointment in one month to reassess headaches. - If headaches persist, order MRI of the brain at follow-up.

## 2023-07-04 ENCOUNTER — Other Ambulatory Visit: Payer: Self-pay

## 2023-07-04 ENCOUNTER — Encounter: Payer: Self-pay | Admitting: Infectious Disease

## 2023-07-04 ENCOUNTER — Ambulatory Visit (INDEPENDENT_AMBULATORY_CARE_PROVIDER_SITE_OTHER): Payer: Self-pay | Admitting: Infectious Disease

## 2023-07-04 VITALS — BP 142/94 | HR 81 | Temp 97.6°F | Wt 219.0 lb

## 2023-07-04 DIAGNOSIS — M4646 Discitis, unspecified, lumbar region: Secondary | ICD-10-CM | POA: Diagnosis not present

## 2023-07-04 DIAGNOSIS — G43809 Other migraine, not intractable, without status migrainosus: Secondary | ICD-10-CM

## 2023-07-04 DIAGNOSIS — G009 Bacterial meningitis, unspecified: Secondary | ICD-10-CM | POA: Diagnosis not present

## 2023-07-04 DIAGNOSIS — M4626 Osteomyelitis of vertebra, lumbar region: Secondary | ICD-10-CM

## 2023-07-04 NOTE — Progress Notes (Signed)
 Written orders sent back with patient for picc removal.  Julien Odor, RMA

## 2023-08-04 ENCOUNTER — Ambulatory Visit: Attending: Emergency Medicine | Admitting: Emergency Medicine

## 2023-08-04 ENCOUNTER — Encounter: Payer: Self-pay | Admitting: Emergency Medicine

## 2023-08-04 VITALS — BP 135/86 | HR 55 | Ht 63.0 in | Wt 215.0 lb

## 2023-08-04 DIAGNOSIS — I5021 Acute systolic (congestive) heart failure: Secondary | ICD-10-CM

## 2023-08-04 DIAGNOSIS — G039 Meningitis, unspecified: Secondary | ICD-10-CM

## 2023-08-04 DIAGNOSIS — I491 Atrial premature depolarization: Secondary | ICD-10-CM

## 2023-08-04 LAB — BASIC METABOLIC PANEL WITH GFR
BUN/Creatinine Ratio: 14 (ref 12–28)
BUN: 16 mg/dL (ref 8–27)
CO2: 23 mmol/L (ref 20–29)
Calcium: 9.1 mg/dL (ref 8.7–10.3)
Chloride: 99 mmol/L (ref 96–106)
Creatinine, Ser: 1.11 mg/dL — ABNORMAL HIGH (ref 0.57–1.00)
Glucose: 78 mg/dL (ref 70–99)
Potassium: 3.7 mmol/L (ref 3.5–5.2)
Sodium: 140 mmol/L (ref 134–144)
eGFR: 52 mL/min/{1.73_m2} — ABNORMAL LOW (ref >59)

## 2023-08-04 MED ORDER — LOSARTAN POTASSIUM 25 MG PO TABS: 25.0000 mg | ORAL_TABLET | Freq: Every day | ORAL | 2 refills | Status: DC

## 2023-08-04 NOTE — Patient Instructions (Signed)
 Medication Instructions:  STOP TAKING AMIODARONE . START TAKING YOUR LOSARTAN  25 MG (1 WHOLE TABLET) DAILY.   Lab Work: BMET TO BE DONE TODAY.   Testing/Procedures: Your physician has requested that you have an ECHOCARDIOGRAM. Echocardiography is a painless test that uses sound waves to create images of your heart. It provides your doctor with information about the size and shape of your heart and how well your heart's chambers and valves are working. This procedure takes approximately one hour. There are no restrictions for this procedure. Please do NOT wear cologne, perfume, aftershave, or lotions (deodorant is allowed). Please arrive 15 minutes prior to your appointment time.  Please note: We ask at that you not bring children with you during ultrasound (echo/ vascular) testing. Due to room size and safety concerns, children are not allowed in the ultrasound rooms during exams. Our front office staff cannot provide observation of children in our lobby area while testing is being conducted. An adult accompanying a patient to their appointment will only be allowed in the ultrasound room at the discretion of the ultrasound technician under special circumstances. We apologize for any inconvenience.   Follow-Up: At [facility], you and your health needs are our priority.  As part of our continuing mission to provide you with exceptional heart care, our providers are all part of one team.  This team includes your primary Cardiologist (physician) and Advanced Practice Providers or APPs (Physician Assistants and Nurse Practitioners) who all work together to provide you with the care you need, when you need it.  Your next appointment:   4 MONTHS  Provider:   MADISON FOUNTAIN, DNP

## 2023-08-04 NOTE — Progress Notes (Signed)
 Cardiology Office Note:    Date:  08/04/2023  ID:  Morgan Morgan, DOB Sep 08, 1950, MRN [id number] PCP: Ezell Hollow, NP  [facility] Providers Cardiologist:  Oneil Bigness, MD       Patient Profile:       Chief Complaint: 1 month follow-up History of Present Illness:  Morgan Morgan is a 73 y.o. female with visit-pertinent history of acute systolic heart failure, sinus rhythm with PACs   She presented to the ED on 4 10/2023 with altered mental status after being found down by her grandson.  CTA, CT angio, MRI brain negative.  EKG showed A-fib with RVR 116 bpm and started on amnio.  Chest x-ray with cardiomegaly.  She developed fever started on IV antibiotics.  Continue to decline requiring the patient pressure initiation for hypotension.  There was concern for discitis/meningitis.  Treating empirically.  Echocardiogram performed showing newly reduced LVEF 20 to 25%, global hypokinesis.  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  EEG showed severe diffuse encephalopathy.  Cardiology evaluated in ICU and point-of-care ultrasound showed LVEF 40 to 45%, EF reduction thought to be related to critical illness.  Cardiology noted initial rhythm was thought to be atrial fibrillation was likely sinus rhythm with PACs.  Eliquis  has been stopped.  Limited echocardiogram showed LVEF 40 to 45%, normal RV systolic function.  She was discharged on Lasix  20 mg as needed, carvedilol  3.125 mg twice daily, losartan  12.5 mg daily, spironolactone  12 5 mg daily.  She did develop some hypertension and her spironolactone  and losartan  were held at discharge.  Patient was last seen in office on 06/22/2023.  She is doing well at the time without cardiovascular concerns or complaints.  She was started on losartan  25 mg daily to further optimize GDMT.   Discussed the use of AI scribe software for clinical note transcription with the patient, who gave verbal consent to proceed.  History of  Present Illness Morgan Morgan is a 73 year old female with a history of meningitis who presents for 1 month follow-up.  Today she presents to office without acute cardiovascular concerns or complaints.  She tells me she is doing well overall.  Her lightheadedness and dizziness have entirely improved.  She is without any chest pains, shortness of breath, leg swelling, orthopnea, PND.  Most importantly she tells me her fatigue has improved.  Notes she feels back to her baseline.  She has been compliant with her current medication regimen.  She remains active by caring for her grandson and has not participated in formal cardiac rehabilitation but maintains activity through daily house tasks.   Review of systems:  Please see the history of present illness. All other systems are reviewed and otherwise negative.      Studies Reviewed:        Echocardiogram Limited 06/06/2023 1. Left ventricular ejection fraction, by estimation, is 40 to 45%. The  left ventricle has mildly decreased function. The left ventricle  demonstrates global hypokinesis.   2. Right ventricular systolic function is normal. The right ventricular  size is normal.   3. Left atrial size was severely dilated.   4. Right atrial size was mildly dilated.   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   6. The aortic valve is tricuspid. There is moderate calcification of the  aortic valve.   7. The inferior vena cava is normal in size with greater than 50%  respiratory variability, suggesting right atrial pressure of 3 mmHg.  Echocardiogram 05/26/2023  1. Left ventricular ejection fraction, by estimation, is 20 to 25%. The  left ventricle has severely decreased function. The left ventricle  demonstrates global hypokinesis. There is mild left ventricular  hypertrophy. Left ventricular diastolic parameters   are indeterminate.   2. Right ventricular systolic function is normal. The right ventricular  size is normal. There is normal  pulmonary artery systolic pressure.   3. Left atrial size was severely dilated.   4. The mitral valve is myxomatous. Trivial mitral valve regurgitation. No  evidence of mitral stenosis.   5. The aortic valve is calcified. Aortic valve regurgitation is not  visualized. Aortic valve sclerosis/calcification is present, without any  evidence of aortic stenosis.   6. The inferior vena cava is normal in size with greater than 50%  respiratory variability, suggesting right atrial pressure of 3 mmHg.   Risk Assessment/Calculations:              Physical Exam:   VS:  BP 135/86 (BP Location: Right Arm)   Pulse (!) 55   Ht 5' 3 (1.6 m)   Wt 215 lb (97.5 kg)   SpO2 95%   BMI 38.09 kg/m    Wt Readings from Last 3 Encounters:  08/04/23 215 lb (97.5 kg)  07/04/23 219 lb (99.3 kg)  06/22/23 212 lb (96.2 kg)    GEN: Well nourished, well developed in no acute distress NECK: No JVD; No carotid bruits CARDIAC: RRR, no murmurs, rubs, gallops RESPIRATORY:  Clear to auscultation without rales, wheezing or rhonchi  ABDOMEN: Soft, non-tender, non-distended EXTREMITIES:  No edema; No acute deformity      Assessment and Plan:   Acute systolic heart failure 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  Denies chest pains, dyspnea, orthopnea, PND, leg swelling - Plan to increase losartan  to 25 mg to further optimize GDMT - Continue carvedilol  3.125 mg twice daily and furosemide  40 mg daily - Plan for repeat echocardiogram x 3 months - BMET today   Frequent PACs There was a concern for Afib when patient was in the ICU. Eliquis  was stopped on 4/18 as cardiology determined patient was having frequent PACs instead of Afib. Amiodarone  was started on  4/19 to help suppress PACs - Denies any palpitations or irregular heart rhythms, overall asymptomatic - Will plan to discontinue amiodarone  today as there is no clear atrial fibrillation noted during admission and PAC burden seems well-controlled.  EKG 5/7 without PAC - Can consider ZIO in future if symptoms arise - Continue carvedilol  3.125 mg twice daily  Meningoencephalitis  Discitis Was hospitalized with severe illness and further confirmed by MRI and treated with antibiotics with significant improvement - Denies headaches, lightheadedness, dizziness to - Has follow-up on 6/23 with infectious disease      Dispo:  Return in about 4 months (around 12/04/2023).  Signed, Ava Boatman, NP

## 2023-08-07 NOTE — Progress Notes (Unsigned)
 Subjective:  Chief Complaint follow-up for meningitis  Patient ID: Morgan Morgan, female    DOB: [birth year], 73 y.o.   MRN: [id number]  HPI  Discussed the use of AI scribe software for clinical note transcription with the patient, who gave verbal consent to proceed.  History of Present Illness   Morgan Morgan is a 73 year old female who presents for follow-up after hospitalization for meningitis. She is accompanied by her great grandson.  She was hospitalized for meningitis, requiring intensive care and ventilatory support. Blood cultures were not initially obtained, and the specific bacteria was not identified. She received empirical antibiotic treatment but still had a headache when I las saw her. Since then her headache has resolved completely. She still has a scab from an injury when she fell when she was septic.  Back pain experienced during hospitalization has resolved, with no current symptoms suggesting active infection.  A lesion present during hospitalization, initially the size of an egg, is decreasing in size. Her grandson affirmed that she had an injury here when she fell at home prior to being hospitalized.  She denies current headache and back pain.       Past Medical History:  Diagnosis Date   Asthma    Hypertension     Past Surgical History:  Procedure Laterality Date   ABDOMINAL HYSTERECTOMY     IR LUMBAR PUNCTURE  05/28/2023    Family History  Problem Relation Age of Onset   Asthma Mother    Asthma Brother    Asthma Son    Colon cancer Neg Hx    Colon polyps Neg Hx       Social History   Socioeconomic History   Marital status: Widowed    Spouse name: Not on file   Number of children: Not on file   Years of education: Not on file   Highest education level: Not on file  Occupational History   Not on file  Tobacco Use   Smoking status: Never   Smokeless tobacco: Not on file  Vaping Use   Vaping status: Never Used  Substance and Sexual Activity    Alcohol use: No   Drug use: No   Sexual activity: Yes  Other Topics Concern   Not on file  Social History Narrative   Not on file   Social Drivers of Health   Financial Resource Strain: Not on file  Food Insecurity: Patient Unable To Answer (05/26/2023)   Hunger Vital Sign    Worried About Running Out of Food in the Last Year: Patient unable to answer    Ran Out of Food in the Last Year: Patient unable to answer  Transportation Needs: Patient Unable To Answer (05/26/2023)   PRAPARE - Transportation    Lack of Transportation (Medical): Patient unable to answer    Lack of Transportation (Non-Medical): Patient unable to answer  Physical Activity: Not on file  Stress: Not on file  Social Connections: Patient Unable To Answer (05/26/2023)   Social Connection and Isolation Panel    Frequency of Communication with Friends and Family: Patient unable to answer    Frequency of Social Gatherings with Friends and Family: Patient unable to answer    Attends Religious Services: Patient unable to answer    Active Member of Clubs or Organizations: Patient unable to answer    Attends Banker Meetings: Patient unable to answer    Marital Status: Patient unable to answer    Allergies  Allergen Reactions  Zithromax [Azithromycin] Itching     Current Outpatient Medications:    acetaminophen  (TYLENOL ) 325 MG tablet, Take 2 tablets (650 mg total) by mouth every 6 (six) hours as needed for headache or fever., Disp: , Rfl:    ADVAIR DISKUS 250-50 MCG/ACT AEPB, as needed (sob, wheezing)., Disp: , Rfl:    albuterol  (VENTOLIN  HFA) 108 (90 Base) MCG/ACT inhaler, Inhale into the lungs every 6 (six) hours as needed for wheezing or shortness of breath., Disp: , Rfl:    arformoterol  (BROVANA ) 15 MCG/2ML NEBU, Take 2 mLs (15 mcg total) by nebulization 2 (two) times daily., Disp: , Rfl:    benzonatate  (TESSALON ) 100 MG capsule, Take 1 capsule (100 mg total) by mouth 3 (three) times daily as  needed for cough., Disp: 20 capsule, Rfl: 0   budesonide  (PULMICORT ) 0.25 MG/2ML nebulizer solution, Take 2 mLs (0.25 mg total) by nebulization 2 (two) times daily., Disp: 60 mL, Rfl: 12   carvedilol  (COREG ) 3.125 MG tablet, Take 1 tablet (3.125 mg total) by mouth 2 (two) times daily with a meal., Disp: , Rfl:    cefTRIAXone  2 g in sodium chloride  0.9 % 100 mL, Inject 2 g into the vein daily. (Patient not taking: Reported on 08/04/2023), Disp: , Rfl:    D3 SUPER STRENGTH 50 MCG (2000 UT) CAPS, Take 1 capsule by mouth daily., Disp: , Rfl:    daptomycin  (CUBICIN ) 500-0.9 MG/50ML-% SOLN, Inject 50 mLs (500 mg total) into the vein daily at 2 PM. (Patient not taking: Reported on 08/04/2023), Disp: , Rfl:    feeding supplement (ENSURE ENLIVE / ENSURE PLUS) LIQD, Take 237 mLs by mouth 2 (two) times daily between meals. (Patient not taking: Reported on 08/04/2023), Disp: 237 mL, Rfl: 12   fluticasone (FLONASE) 50 MCG/ACT nasal spray, Place 1 spray into both nostrils at bedtime., Disp: , Rfl:    furosemide  (LASIX ) 40 MG tablet, Take 40 mg by mouth daily., Disp: , Rfl:    lidocaine  (LIDODERM ) 5 %, Place 1 patch onto the skin daily. Remove & Discard patch within 12 hours or as directed by MD (Patient not taking: Reported on 08/04/2023), Disp: 30 patch, Rfl: 0   loratadine (CLARITIN) 10 MG tablet, Take 10 mg by mouth every morning., Disp: , Rfl:    losartan  (COZAAR ) 25 MG tablet, Take 1 tablet (25 mg total) by mouth daily., Disp: 90 tablet, Rfl: 2   mometasone -formoterol  (DULERA ) 100-5 MCG/ACT AERO, Inhale 2 puffs into the lungs 2 (two) times daily., Disp: 1 Inhaler, Rfl: 0   polyethylene glycol (MIRALAX  / GLYCOLAX ) 17 g packet, Take 17 g by mouth daily as needed for moderate constipation. (Patient not taking: Reported on 08/04/2023), Disp: 14 each, Rfl: 0   Review of Systems  Constitutional:  Negative for activity change, appetite change, chills, diaphoresis, fatigue, fever and unexpected weight change.  HENT:   Negative for congestion, rhinorrhea, sinus pressure, sneezing, sore throat and trouble swallowing.   Eyes:  Negative for photophobia and visual disturbance.  Respiratory:  Negative for cough, chest tightness, shortness of breath, wheezing and stridor.   Cardiovascular:  Negative for chest pain, palpitations and leg swelling.  Gastrointestinal:  Negative for abdominal distention, abdominal pain, anal bleeding, blood in stool, constipation, diarrhea, nausea and vomiting.  Genitourinary:  Negative for difficulty urinating, dysuria, flank pain and hematuria.  Musculoskeletal:  Negative for arthralgias, back pain, gait problem, joint swelling and myalgias.  Skin:  Negative for color change, pallor, rash and wound.  Neurological:  Negative for  dizziness, tremors, weakness and light-headedness.  Hematological:  Negative for adenopathy. Does not bruise/bleed easily.  Psychiatric/Behavioral:  Negative for agitation, behavioral problems, confusion, decreased concentration, dysphoric mood and sleep disturbance.        Objective:   Physical Exam Constitutional:      General: She is not in acute distress.    Appearance: Normal appearance. She is well-developed. She is not ill-appearing or diaphoretic.  HENT:     Head: Normocephalic and atraumatic.     Right Ear: Hearing and external ear normal.     Left Ear: Hearing and external ear normal.     Nose: No nasal deformity or rhinorrhea.   Eyes:     General: No scleral icterus.    Conjunctiva/sclera: Conjunctivae normal.     Right eye: Right conjunctiva is not injected.     Left eye: Left conjunctiva is not injected.     Pupils: Pupils are equal, round, and reactive to light.   Neck:     Vascular: No JVD.   Cardiovascular:     Rate and Rhythm: Normal rate and regular rhythm.     Heart sounds: S1 normal and S2 normal.  Pulmonary:     Effort: Pulmonary effort is normal. No respiratory distress.     Breath sounds: No wheezing.  Abdominal:      General: Bowel sounds are normal. There is no distension.     Palpations: Abdomen is soft.   Musculoskeletal:        General: Normal range of motion.     Right shoulder: Normal.     Left shoulder: Normal.     Cervical back: Normal range of motion and neck supple.     Right hip: Normal.     Left hip: Normal.     Right knee: Normal.     Left knee: Normal.  Lymphadenopathy:     Head:     Right side of head: No submandibular, preauricular or posterior auricular adenopathy.     Left side of head: No submandibular, preauricular or posterior auricular adenopathy.     Cervical: No cervical adenopathy.     Right cervical: No superficial or deep cervical adenopathy.    Left cervical: No superficial or deep cervical adenopathy.   Skin:    General: Skin is warm and dry.     Coloration: Skin is not pale.     Findings: No abrasion, bruising, ecchymosis, erythema, lesion or rash.     Nails: There is no clubbing.   Neurological:     Mental Status: She is alert and oriented to person, place, and time.     Sensory: No sensory deficit.     Coordination: Coordination normal.     Gait: Gait normal.   Psychiatric:        Attention and Perception: She is attentive.        Mood and Affect: Mood normal.        Speech: Speech normal.        Behavior: Behavior normal. Behavior is cooperative.        Thought Content: Thought content normal.        Judgment: Judgment normal.      Leg lesion       Assessment & Plan:   Assessment and Plan    Meningitis with sepsis and septic shock and multi-organ failure --resolved --while she wants to know how she got meninigitis without the organism I cannot provide too much speculation though a resp pathogen such as  pneumonoccus would be top of my list  Leg lesion Lesion decreasing in size, likely a healing scab.

## 2023-08-08 ENCOUNTER — Encounter: Payer: Self-pay | Admitting: Infectious Disease

## 2023-08-08 ENCOUNTER — Other Ambulatory Visit: Payer: Self-pay

## 2023-08-08 ENCOUNTER — Ambulatory Visit: Payer: Self-pay | Admitting: Emergency Medicine

## 2023-08-08 ENCOUNTER — Ambulatory Visit (INDEPENDENT_AMBULATORY_CARE_PROVIDER_SITE_OTHER): Payer: Self-pay | Admitting: Infectious Disease

## 2023-08-08 VITALS — BP 145/86 | HR 107 | Temp 97.7°F | Wt 215.0 lb

## 2023-08-08 DIAGNOSIS — G009 Bacterial meningitis, unspecified: Secondary | ICD-10-CM | POA: Diagnosis not present

## 2023-08-08 DIAGNOSIS — G039 Meningitis, unspecified: Secondary | ICD-10-CM

## 2023-08-08 DIAGNOSIS — L989 Disorder of the skin and subcutaneous tissue, unspecified: Secondary | ICD-10-CM

## 2023-08-08 DIAGNOSIS — A419 Sepsis, unspecified organism: Secondary | ICD-10-CM | POA: Diagnosis not present

## 2023-08-08 DIAGNOSIS — M4646 Discitis, unspecified, lumbar region: Secondary | ICD-10-CM

## 2023-08-08 DIAGNOSIS — Z79899 Other long term (current) drug therapy: Secondary | ICD-10-CM

## 2023-08-08 DIAGNOSIS — R6521 Severe sepsis with septic shock: Secondary | ICD-10-CM

## 2023-08-09 ENCOUNTER — Other Ambulatory Visit: Payer: Self-pay | Admitting: *Deleted

## 2023-08-09 DIAGNOSIS — Z79899 Other long term (current) drug therapy: Secondary | ICD-10-CM

## 2023-08-16 ENCOUNTER — Emergency Department (HOSPITAL_COMMUNITY)
Admission: EM | Admit: 2023-08-16 | Discharge: 2023-08-16 | Disposition: A | Discharge status: Home / Self Care | Attending: Emergency Medicine | Admitting: Emergency Medicine

## 2023-08-16 ENCOUNTER — Other Ambulatory Visit: Payer: Self-pay

## 2023-08-16 ENCOUNTER — Encounter (HOSPITAL_COMMUNITY): Payer: Self-pay

## 2023-08-16 DIAGNOSIS — L089 Local infection of the skin and subcutaneous tissue, unspecified: Secondary | ICD-10-CM | POA: Insufficient documentation

## 2023-08-16 HISTORY — DX: Sepsis, unspecified organism: A41.9

## 2023-08-16 HISTORY — DX: Bacterial meningitis, unspecified: G00.9

## 2023-08-16 HISTORY — DX: Acute systolic (congestive) heart failure: I50.21

## 2023-08-16 MED ORDER — DOXYCYCLINE HYCLATE 100 MG PO CAPS: 100.0000 mg | ORAL_CAPSULE | Freq: Two times a day (BID) | ORAL | 0 refills | Status: AC

## 2023-08-16 NOTE — Discharge Instructions (Signed)
 Warm wet compresses to the area.  Keep the area clean.  Take the antibiotic as directed until finished.  Return here if needed or follow up with your primary doctor

## 2023-08-16 NOTE — ED Triage Notes (Signed)
 Pt arrived via POV c/o abscess on posterior scalp and the top of her neck she reports she first noticed on Saturday. Pt reports she was initially concerned of a possible tick bite.

## 2023-08-19 NOTE — ED Provider Notes (Signed)
 [facility] AT [facility] Provider Note   CSN: [id number] Arrival date & time: 08/16/23  1139     Patient presents with: Abscess   Morgan Morgan is a 73 y.o. female.    Abscess Associated symptoms: no fever, no headaches, no nausea and no vomiting        Morgan Morgan is a 73 y.o. female who presents to the Emergency Department requesting evaluation of a painful bump to her right scalp.  Noticed 3 days ago.  She was concerned of possible tick bite.  Area is painful with palpation and with movement of her neck.  She denies any radiating pains numbness or weakness headaches, dizziness fever or chills.     Prior to Admission medications   Medication Sig Start Date End Date Taking? Authorizing Provider  doxycycline  (VIBRAMYCIN ) 100 MG capsule Take 1 capsule (100 mg total) by mouth 2 (two) times daily. 08/16/23  Yes Elnita Surprenant, PA-C  acetaminophen  (TYLENOL ) 325 MG tablet Take 2 tablets (650 mg total) by mouth every 6 (six) hours as needed for headache or fever. 06/10/23   Nooruddin, Saad, MD  ADVAIR DISKUS 250-50 MCG/ACT AEPB as needed (sob, wheezing). 07/08/23   [provider]  albuterol  (VENTOLIN  HFA) 108 (90 Base) MCG/ACT inhaler Inhale into the lungs every 6 (six) hours as needed for wheezing or shortness of breath.    [provider]  arformoterol  (BROVANA ) 15 MCG/2ML NEBU Take 2 mLs (15 mcg total) by nebulization 2 (two) times daily. 06/10/23   Nooruddin, Saad, MD  benzonatate  (TESSALON ) 100 MG capsule Take 1 capsule (100 mg total) by mouth 3 (three) times daily as needed for cough. 06/10/23   Nooruddin, Saad, MD  budesonide  (PULMICORT ) 0.25 MG/2ML nebulizer solution Take 2 mLs (0.25 mg total) by nebulization 2 (two) times daily. 06/10/23   Nooruddin, Saad, MD  carvedilol  (COREG ) 3.125 MG tablet Take 1 tablet (3.125 mg total) by mouth 2 (two) times daily with a meal. 06/10/23   Nooruddin, Saad, MD  D3 SUPER STRENGTH 50 MCG (2000 UT)  CAPS Take 1 capsule by mouth daily. 08/03/21   [provider]  feeding supplement (ENSURE ENLIVE / ENSURE PLUS) LIQD Take 237 mLs by mouth 2 (two) times daily between meals. Patient not taking: Reported on 08/08/2023 06/11/23   Nooruddin, Saad, MD  fluticasone (FLONASE) 50 MCG/ACT nasal spray Place 1 spray into both nostrils at bedtime. 07/08/23   [provider]  furosemide  (LASIX ) 40 MG tablet Take 40 mg by mouth daily. 07/08/23   [provider]  lidocaine  (LIDODERM ) 5 % Place 1 patch onto the skin daily. Remove & Discard patch within 12 hours or as directed by MD Patient not taking: Reported on 08/08/2023 06/11/23   Nooruddin, Saad, MD  loratadine (CLARITIN) 10 MG tablet Take 10 mg by mouth every morning. 07/08/23   [provider]  losartan  (COZAAR ) 25 MG tablet Take 1 tablet (25 mg total) by mouth daily. 08/04/23   Rana Lum CROME, NP  mometasone -formoterol  (DULERA ) 100-5 MCG/ACT AERO Inhale 2 puffs into the lungs 2 (two) times daily. 07/27/12   Gosrani, Nimish C, MD  polyethylene glycol (MIRALAX  / GLYCOLAX ) 17 g packet Take 17 g by mouth daily as needed for moderate constipation. Patient not taking: Reported on 08/08/2023 06/10/23   Nooruddin, Saad, MD    Allergies: Zithromax [azithromycin]    Review of Systems  Constitutional:  Negative for chills and fever.  Respiratory:  Negative for shortness of breath.  Cardiovascular:  Negative for chest pain.  Gastrointestinal:  Negative for nausea and vomiting.  Musculoskeletal:  Negative for arthralgias and myalgias.  Skin:  Positive for wound.  Neurological:  Negative for dizziness, weakness and headaches.    Updated Vital Signs BP (!) 140/90 (BP Location: Right Arm)   Pulse 64   Temp 98 F (36.7 C) (Oral)   Resp 18   Ht 5' 3 (1.6 m)   Wt 97.5 kg   SpO2 99%   BMI 38.08 kg/m   Physical Exam Vitals and nursing note reviewed.  Constitutional:      General: She is not in acute distress.     Appearance: Normal appearance. She is not toxic-appearing.  Neck:     Comments: 1 cm pustule base of right scalp at the hairline.  No surrounding lymphadenopathy.  No drainage. Cardiovascular:     Rate and Rhythm: Normal rate and regular rhythm.     Pulses: Normal pulses.  Pulmonary:     Effort: Pulmonary effort is normal.  Musculoskeletal:        General: Normal range of motion.     Cervical back: Normal range of motion.  Lymphadenopathy:     Cervical: No cervical adenopathy.     Right cervical: No superficial or posterior cervical adenopathy.    Left cervical: No superficial or posterior cervical adenopathy.  Skin:    General: Skin is warm.  Neurological:     General: No focal deficit present.     Mental Status: She is alert.     Sensory: No sensory deficit.     Motor: No weakness.     (all labs ordered are listed, but only abnormal results are displayed) Labs Reviewed - No data to display  EKG: None  Radiology: No results found.   Procedures   INCISION AND DRAINAGE Performed by: Elizzie Westergard Consent: Verbal consent obtained. Risks and benefits: risks, benefits and alternatives were discussed Type: abscess  Body area: right scalp  Anesthesia: none  Area prepped and cleaned with alcohol   Pustule de roofed using sterile 18 g needle  Complexity: simple  Drainage: purulent  Drainage amount: small  Packing material: none  Patient tolerance: Patient tolerated the procedure well with no immediate complications.    Medications Ordered in the ED - No data to display                                  Medical Decision Making Patient here for evaluation of painful nodule of her right scalp area noticed several days ago.  She was concerned that she may have had a tick bite.  Has not removed a tick but saw 1 crawling on her.  She is well-appearing on exam.  Vital signs reassuring.  There is a single small pustule at the base of the scalp within the  hairline.  Amount and/or Complexity of Data Reviewed Discussion of management or test interpretation with external provider(s): Pustule drained by me successfully.  Will start doxycycline , patient agreeable to warm compresses.  Appears appropriate for discharge home, will follow-up closely outpatient if needed  Risk Prescription drug management.        Final diagnoses:  Pustule    ED Discharge Orders          Ordered    doxycycline  (VIBRAMYCIN ) 100 MG capsule  2 times daily        08/16/23 1350  Herlinda Milling, PA-C 08/19/23 1426    Bernard Drivers, MD 08/21/23 [phone]

## 2023-10-04 ENCOUNTER — Ambulatory Visit (HOSPITAL_COMMUNITY)
Admission: RE | Admit: 2023-10-04 | Discharge: 2023-10-04 | Disposition: A | Discharge status: Home / Self Care | Source: Ambulatory Visit | Attending: Internal Medicine | Admitting: Internal Medicine

## 2023-10-04 DIAGNOSIS — I5021 Acute systolic (congestive) heart failure: Secondary | ICD-10-CM

## 2023-10-04 LAB — ECHOCARDIOGRAM COMPLETE
AR max vel: 2.04 cm2
AV Area VTI: 1.98 cm2
AV Area mean vel: 1.98 cm2
AV Mean grad: 7 mmHg
AV Peak grad: 12.7 mmHg
Ao pk vel: 1.78 m/s
Calc EF: 44.1 %
S' Lateral: 3.65 cm
Single Plane A2C EF: 38 %
Single Plane A4C EF: 54.8 %

## 2023-10-07 ENCOUNTER — Other Ambulatory Visit: Payer: Self-pay | Admitting: *Deleted

## 2023-10-07 DIAGNOSIS — I491 Atrial premature depolarization: Secondary | ICD-10-CM

## 2023-10-07 DIAGNOSIS — I5021 Acute systolic (congestive) heart failure: Secondary | ICD-10-CM

## 2023-10-07 MED ORDER — LOSARTAN POTASSIUM 25 MG PO TABS
25.0000 mg | ORAL_TABLET | Freq: Every day | ORAL | 2 refills | Status: AC
Start: 2023-10-07 — End: ?

## 2023-10-07 MED ORDER — FUROSEMIDE 40 MG PO TABS: 40.0000 mg | ORAL_TABLET | Freq: Every day | ORAL | 2 refills | Status: AC

## 2023-10-07 MED ORDER — CARVEDILOL 3.125 MG PO TABS: 3.1250 mg | ORAL_TABLET | Freq: Two times a day (BID) | ORAL | 2 refills | Status: AC

## 2023-11-26 NOTE — Progress Notes (Deleted)
  Cardiology Office Note:  .   Date:  11/26/2023  ID:  Morgan Morgan, DOB [birth year], MRN [id number] PCP: Joshua Rayfield RAMAN, NP  [facility] Providers Cardiologist:  Darryle ONEIDA Decent, MD { Click to update primary MD,subspecialty MD or APP then REFRESH:1}   History of Present Illness: .   No chief complaint on file.   Morgan Morgan is a 73 y.o. female with history of CHF who presents for follow-up.      Problem List Systolic HF -EF 20-25% 05/26/2023 -EF 40-45% 06/06/2023 -EF 35-40% 10/04/2023 PACs HTN    ROS: All other ROS reviewed and negative. Pertinent positives noted in the HPI.     Studies Reviewed: SABRA       TTE 10/04/2023  1. Left ventricular ejection fraction, by estimation, is 35 to 40%. The  left ventricle has moderately decreased function. The left ventricle  demonstrates global hypokinesis. Left ventricular diastolic parameters are  consistent with Grade I diastolic  dysfunction (impaired relaxation). The average left ventricular global  longitudinal strain is -10.5 %.   2. Right ventricular systolic function is mildly reduced. The right  ventricular size is normal. There is normal pulmonary artery systolic  pressure. The estimated right ventricular systolic pressure is 34.4 mmHg.   3. Left atrial size was severely dilated.   4. The mitral valve is grossly normal. Mild mitral valve regurgitation.  No evidence of mitral stenosis.   5. The aortic valve is tricuspid. Aortic valve regurgitation is not  visualized. No aortic stenosis is present.   6. The inferior vena cava is normal in size with greater than 50%  respiratory variability, suggesting right atrial pressure of 3 mmHg.  Physical Exam:   VS:  There were no vitals taken for this visit.   Wt Readings from Last 3 Encounters:  08/16/23 214 lb 15.2 oz (97.5 kg)  08/08/23 215 lb (97.5 kg)  08/04/23 215 lb (97.5 kg)    GEN: Well nourished, well developed in no acute distress NECK: No JVD; No carotid  bruits CARDIAC: ***RRR, no murmurs, rubs, gallops RESPIRATORY:  Clear to auscultation without rales, wheezing or rhonchi  ABDOMEN: Soft, non-tender, non-distended EXTREMITIES:  No edema; No deformity  ASSESSMENT AND PLAN: .   ***    {Are you ordering a CV Procedure (e.g. stress test, cath, DCCV, TEE, etc)?   Press F2        :[id number]}   Follow-up: No follow-ups on file.  Time Spent with Patient: I have spent a total of *** minutes caring for this patient today face to face, ordering and reviewing labs/tests, reviewing prior records/medical history, examining the patient, establishing an assessment and plan, communicating results/findings to the patient/family, and documenting in the medical record.   Signed, Darryle ONEIDA. Decent, MD, [facility]  [facility]  [address] [place], [place] [address] [phone]  1:45 PM

## 2023-11-28 ENCOUNTER — Ambulatory Visit: Admitting: Cardiovascular Disease

## 2023-11-28 DIAGNOSIS — I10 Essential (primary) hypertension: Secondary | ICD-10-CM

## 2023-11-28 DIAGNOSIS — I491 Atrial premature depolarization: Secondary | ICD-10-CM

## 2023-11-28 DIAGNOSIS — I5022 Chronic systolic (congestive) heart failure: Secondary | ICD-10-CM

## 2024-01-15 NOTE — Progress Notes (Deleted)
  Cardiology Office Note:  .   Date:  01/15/2024  ID:  Morgan Morgan, DOB 06-10-1950, MRN [id number] PCP: Joshua Rayfield RAMAN, NP  [facility] Providers Cardiologist:  Darryle ONEIDA Decent, MD   History of Present Illness: .   Morgan chief complaint on file.   Morgan Morgan is a 73 y.o. female with history of *** who presents for the evaluation of *** at the request of ***.     Problem List Systolic HF -EF 20-25% 05/26/2023 -EF 35-40% 10/04/2023 2. PACs  3. HTN    ROS: All other ROS reviewed and negative. Pertinent positives noted in the HPI.     Studies Reviewed: SABRA       TTE 10/04/2023  1. Left ventricular ejection fraction, by estimation, is 35 to 40%. The  left ventricle has moderately decreased function. The left ventricle  demonstrates global hypokinesis. Left ventricular diastolic parameters are  consistent with Grade I diastolic  dysfunction (impaired relaxation). The average left ventricular global  longitudinal strain is -10.5 %.   2. Right ventricular systolic function is mildly reduced. The right  ventricular size is normal. There is normal pulmonary artery systolic  pressure. The estimated right ventricular systolic pressure is 34.4 mmHg.   3. Left atrial size was severely dilated.   4. The mitral valve is grossly normal. Mild mitral valve regurgitation.  Morgan evidence of mitral stenosis.   5. The aortic valve is tricuspid. Aortic valve regurgitation is not  visualized. Morgan aortic stenosis is present.   6. The inferior vena cava is normal in size with greater than 50%  respiratory variability, suggesting right atrial pressure of 3 mmHg.  Physical Exam:   VS:  There were Morgan vitals taken for this visit.   Wt Readings from Last 3 Encounters:  08/16/23 214 lb 15.2 oz (97.5 kg)  08/08/23 215 lb (97.5 kg)  08/04/23 215 lb (97.5 kg)    GEN: Well nourished, well developed in Morgan acute distress NECK: Morgan JVD; Morgan carotid bruits CARDIAC: ***RRR, Morgan murmurs, rubs,  gallops RESPIRATORY:  Clear to auscultation without rales, wheezing or rhonchi  ABDOMEN: Soft, non-tender, non-distended EXTREMITIES:  Morgan edema; Morgan deformity  ASSESSMENT AND PLAN: .   ***    {Are you ordering a CV Procedure (e.g. stress test, cath, DCCV, TEE, etc)?   Press F2        :[id number]}   Follow-up: Morgan follow-ups on file.  Signed, Darryle ONEIDA. Decent, MD, [facility]  [facility]  [address] [place], [place] [address] [phone]  4:08 PM

## 2024-01-18 ENCOUNTER — Ambulatory Visit: Attending: Cardiovascular Disease | Admitting: Cardiovascular Disease

## 2024-01-18 DIAGNOSIS — I5022 Chronic systolic (congestive) heart failure: Secondary | ICD-10-CM

## 2024-01-18 DIAGNOSIS — I1A0 Resistant hypertension: Secondary | ICD-10-CM
